# Patient Record
Sex: Male | Born: 1995 | Race: White | Hispanic: No | Marital: Single | Smoking: Never smoker
Health system: Southern US, Community
[De-identification: ages and names within clinical notes are randomized; demographics above are authoritative.]

## PROBLEM LIST (undated history)

## (undated) DIAGNOSIS — N442 Benign cyst of testis: Secondary | ICD-10-CM

## (undated) DIAGNOSIS — K219 Gastro-esophageal reflux disease without esophagitis: Secondary | ICD-10-CM

## (undated) DIAGNOSIS — I880 Nonspecific mesenteric lymphadenitis: Secondary | ICD-10-CM

## (undated) DIAGNOSIS — I10 Essential (primary) hypertension: Secondary | ICD-10-CM

## (undated) DIAGNOSIS — R519 Headache, unspecified: Secondary | ICD-10-CM

## (undated) DIAGNOSIS — R51 Headache: Secondary | ICD-10-CM

## (undated) HISTORY — PX: APPENDECTOMY: SHX54

## (undated) HISTORY — DX: Benign cyst of testis: N44.2

## (undated) HISTORY — PX: WISDOM TOOTH EXTRACTION: SHX21

---
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
## 2016-04-05 ENCOUNTER — Encounter: Payer: Self-pay | Admitting: Emergency Medicine

## 2016-04-05 ENCOUNTER — Emergency Department
Admission: EM | Admit: 2016-04-05 | Discharge: 2016-04-05 | Disposition: A | Discharge status: Home / Self Care | Payer: 59 | Source: Home / Self Care | Attending: Family Medicine | Admitting: Family Medicine

## 2016-04-05 DIAGNOSIS — J069 Acute upper respiratory infection, unspecified: Secondary | ICD-10-CM

## 2016-04-05 MED ORDER — AZITHROMYCIN 250 MG PO TABS: 250.0000 mg | ORAL_TABLET | Freq: Every day | ORAL | Status: DC

## 2016-04-05 MED ORDER — BENZONATATE 100 MG PO CAPS
100.0000 mg | ORAL_CAPSULE | Freq: Three times a day (TID) | ORAL | Status: DC
Start: 2016-04-05 — End: 2016-04-20

## 2016-04-05 NOTE — ED Notes (Signed)
Patient presents to [facility] with C/O cough cold and congestion times 1 1/2 weeks productive cough with greenish sputum. Denies pain or fever.

## 2016-04-05 NOTE — Discharge Instructions (Signed)
You may take 400-600mg  Ibuprofen (Motrin) every 6-8 hours for fever and pain  Alternate with Tylenol  You may take 500mg  Tylenol every 4-6 hours as needed for fever and pain  Follow-up with your primary care provider next week for recheck of symptoms if not improving.  Be sure to drink plenty of fluids and rest, at least 8hrs of sleep a night, preferably more while you are sick. Return urgent care or go to closest ER if you cannot keep down fluids/signs of dehydration, fever not reducing with Tylenol, difficulty breathing/wheezing, stiff neck, worsening condition, or other concerns (see below)  Please take antibiotics as prescribed and be sure to complete entire course even if you start to feel better to ensure infection does not come back.   Cool Mist Vaporizers Vaporizers may help relieve the symptoms of a cough and cold. They add moisture to the air, which helps mucus to become thinner and less sticky. This makes it easier to breathe and cough up secretions. Cool mist vaporizers do not cause serious burns like hot mist vaporizers, which may also be called steamers or humidifiers. Vaporizers have not been proven to help with colds. You should not use a vaporizer if you are allergic to mold. HOME CARE INSTRUCTIONS  Follow the package instructions for the vaporizer.  Do not use anything other than distilled water in the vaporizer.  Do not run the vaporizer all of the time. This can cause mold or bacteria to grow in the vaporizer.  Clean the vaporizer after each time it is used.  Clean and dry the vaporizer well before storing it.  Stop using the vaporizer if worsening respiratory symptoms develop.   This information is not intended to replace advice given to you by your health care provider. Make sure you discuss any questions you have with your health care provider.   Document Released: 06/18/2004 Document Revised: 09/26/2013 Document Reviewed: 02/08/2013 Elsevier Interactive Patient  Education 2016 [employer]  Cough, Adult A cough helps to clear your throat and lungs. A cough may last only 2-3 weeks (acute), or it may last longer than 8 weeks (chronic). Many different things can cause a cough. A cough may be a sign of an illness or another medical condition. HOME CARE  Pay attention to any changes in your cough.  Take medicines only as told by your doctor.  If you were prescribed an antibiotic medicine, take it as told by your doctor. Do not stop taking it even if you start to feel better.  Talk with your doctor before you try using a cough medicine.  Drink enough fluid to keep your pee (urine) clear or pale yellow.  If the air is dry, use a cold steam vaporizer or humidifier in your home.  Stay away from things that make you cough at work or at home.  If your cough is worse at night, try using extra pillows to raise your head up higher while you sleep.  Do not smoke, and try not to be around smoke. If you need help quitting, ask your doctor.  Do not have caffeine.  Do not drink alcohol.  Rest as needed. GET HELP IF:  You have new problems (symptoms).  You cough up yellow fluid (pus).  Your cough does not get better after 2-3 weeks, or your cough gets worse.  Medicine does not help your cough and you are not sleeping well.  You have pain that gets worse or pain that is not helped with medicine.  You have a fever.  You are losing weight and you do not know why.  You have night sweats. GET HELP RIGHT AWAY IF:  You cough up blood.  You have trouble breathing.  Your heartbeat is very fast.   This information is not intended to replace advice given to you by your health care provider. Make sure you discuss any questions you have with your health care provider.   Document Released: 06/04/2011 Document Revised: 06/12/2015 Document Reviewed: 11/28/2014 Elsevier Interactive Patient Education [employer].

## 2016-04-05 NOTE — ED Provider Notes (Signed)
CSN: [id number]     Arrival date & time 04/05/16  1114 History   First MD Initiated Contact with Patient 04/05/16 1145     Chief Complaint  Patient presents with  . Cough  . Nasal Congestion   (Consider location/radiation/quality/duration/timing/severity/associated sxs/prior Treatment) HPI  Erik White is a 20 y.o. male presenting to UC with c/o 1-2 weeks of gradually worsening moderately productive cough with associated bilateral ear fullness and production of green-ish fluid. Mild sore throat. He notes he drove to [facility] and spent a few days there last week but was sick prior to going. Pt believes trip made his symptoms worse. He has been trying Dayquil and Mucinex recently with minimal relief and had tried nasal saline, Flonase and Afrin initially w/o relief. No sick contacts. Denies hx of asthma. Denies difficulty breathing or swallowing. No fever, n/v/d.    Past Medical History  Diagnosis Date  . GERD (gastroesophageal reflux disease)   . Mesenteric adenitis 2005   Past Surgical History  Procedure Laterality Date  . Wisdom tooth extraction     Family History  Problem Relation Age of Onset  . Heart attack Father   . Cancer Other     lung  . Cancer Other     liver CA  . Thyroid disease Mother   . Hypertension Mother    Social History  Substance Use Topics  . Smoking status: Never Smoker   . Smokeless tobacco: None  . Alcohol Use: No    Review of Systems  Constitutional: Negative for fever and chills.  HENT: Positive for congestion, rhinorrhea and sore throat. Negative for ear pain, trouble swallowing and voice change.   Respiratory: Positive for cough. Negative for shortness of breath.   Cardiovascular: Negative for chest pain and palpitations.  Gastrointestinal: Negative for nausea, vomiting, abdominal pain and diarrhea.  Musculoskeletal: Negative for myalgias, back pain and arthralgias.  Skin: Negative for rash.  Neurological: Negative for dizziness,  light-headedness and headaches.    Allergies  Review of patient's allergies indicates no known allergies.  Home Medications   Prior to Admission medications   Medication Sig Start Date End Date Taking? Authorizing Provider  azithromycin (ZITHROMAX) 250 MG tablet Take 1 tablet (250 mg total) by mouth daily. Take first 2 tablets together, then 1 every day until finished. 04/05/16   Noland Fordyce, PA-C  benzonatate (TESSALON) 100 MG capsule Take 1 capsule (100 mg total) by mouth every 8 (eight) hours. 09/17/15   Noland Fordyce, PA-C  benzonatate (TESSALON) 100 MG capsule Take 1-2 capsules (100-200 mg total) by mouth every 8 (eight) hours. 04/05/16   Noland Fordyce, PA-C  dextromethorphan-guaiFENesin (MUCINEX DM) 30-600 MG 12hr tablet Take 1 tablet by mouth 2 (two) times daily. 09/17/15   Noland Fordyce, PA-C   Meds Ordered and Administered this Visit  Medications - No data to display  BP 111/73 mmHg  Pulse 68  Temp(Src) 98.1 F (36.7 C) (Oral)  Resp 16  Ht 6\' 1"  (1.854 m)  Wt 221 lb 8 oz (100.472 kg)  BMI 29.23 kg/m2  SpO2 99% No data found.   Physical Exam  Constitutional: He appears well-developed and well-nourished.  HENT:  Head: Normocephalic and atraumatic.  Right Ear: Tympanic membrane normal.  Left Ear: Tympanic membrane normal.  Nose: Mucosal edema present.  Mouth/Throat: Uvula is midline, oropharynx is clear and moist and mucous membranes are normal.  Eyes: Conjunctivae are normal. No scleral icterus.  Neck: Normal range of motion. Neck supple.  Cardiovascular: Normal  rate, regular rhythm and normal heart sounds.   Pulmonary/Chest: Effort normal and breath sounds normal. No respiratory distress. He has no wheezes. He has no rales.  Faint diffuse rhonchi, clears with cough, no respiratory distress.  Abdominal: Soft. He exhibits no distension. There is no tenderness. There is no rebound.  Musculoskeletal: Normal range of motion.  Neurological: He is alert.  Skin: Skin is  warm and dry.  Nursing note and vitals reviewed.   ED Course  Procedures (including critical care time)  Labs Review Labs Reviewed - No data to display  Imaging Review No results found.    MDM   1. Acute upper respiratory infection    Pt c/o 1-2 weeks of worsening URI symptoms. Pt afebrile. No respiratory distress. Rhonchi clears with cough.  Will treat for bacterial cause given duration and worsening symptoms. Rx: azithromycin and tessalon  Advised pt to use acetaminophen and ibuprofen as needed for fever and pain. Encouraged rest and fluids. F/u with PCP in 7-10 days if not improving, sooner if worsening. Pt verbalized understanding and agreement with tx plan.     Noland Fordyce, PA-C 04/05/16 1452

## 2016-04-20 ENCOUNTER — Encounter: Payer: Self-pay | Admitting: Family Medicine

## 2016-04-20 ENCOUNTER — Ambulatory Visit (INDEPENDENT_AMBULATORY_CARE_PROVIDER_SITE_OTHER): Payer: 59

## 2016-04-20 ENCOUNTER — Ambulatory Visit (INDEPENDENT_AMBULATORY_CARE_PROVIDER_SITE_OTHER): Payer: 59 | Admitting: Family Medicine

## 2016-04-20 VITALS — BP 122/83 | HR 70 | Ht 73.0 in | Wt 218.0 lb

## 2016-04-20 DIAGNOSIS — M7652 Patellar tendinitis, left knee: Secondary | ICD-10-CM | POA: Diagnosis not present

## 2016-04-20 DIAGNOSIS — N63 Unspecified lump in unspecified breast: Secondary | ICD-10-CM | POA: Insufficient documentation

## 2016-04-20 DIAGNOSIS — M25312 Other instability, left shoulder: Secondary | ICD-10-CM

## 2016-04-20 DIAGNOSIS — R946 Abnormal results of thyroid function studies: Secondary | ICD-10-CM

## 2016-04-20 DIAGNOSIS — E291 Testicular hypofunction: Secondary | ICD-10-CM | POA: Diagnosis not present

## 2016-04-20 DIAGNOSIS — R7989 Other specified abnormal findings of blood chemistry: Secondary | ICD-10-CM

## 2016-04-20 MED ORDER — DICLOFENAC SODIUM 1 % TD GEL: 4.0000 g | Freq: Four times a day (QID) | TRANSDERMAL | Status: DC

## 2016-04-20 NOTE — Patient Instructions (Signed)
Thank you for coming in today. Get morning labs soon.  Get xray today and MRI shoulder soon.  Use gel on the knee.  Return a few days after MRI.   Anterior Shoulder Instability With Rehab Anterior shoulder instability is a condition that is characterized by recurrent dislocation of the shoulder joint. Dislocation is an injury in which two adjacent bones are no longer in proper alignment, and the joint surfaces are no longer touching. Subluxation is a similar injury to dislocation; however, the joint surfaces are still touching. Dislocations and subluxations of the shoulder joint (glenohumeral) most commonly involve the upper arm bone (humerus) displacing forward (anteriorly). The shoulder joint allows more motion than any other joint in the body, and because of this it is highly susceptible to injury. When the glenohumeral joint is dislocated or subluxated, the muscles that control the shoulder joint (rotator cuff) tendons become stretched. Repetitive injury results in the shoulder joint becoming loose and results in instability of the shoulder joint. These injuries may also cause a tear in the cartilage that lines the joint and helps keep the humerus head in place (labrum). SYMPTOMS   Severe shoulder pain when the joint is dislocated or subluxated.  Shoulder weakness, pain, and/or inflammation.  Loss of shoulder function.  Pain that worsens with shoulder function, especially motions that involve arm movements above shoulder height.  Feeling of shoulder weakness or instability.  Signs of nerve damage: numbness or paralysis.  Crackling (crepitation) feeling and sound when the injured area is touched or with shoulder motion. CAUSES  Anterior shoulder instability is caused by injury to the glenohumeral joint that causes it to become dislocated or subluxated. Common mechanisms of injury include:  Direct trauma to the shoulder joint.  Repetitive and/or strenuous movements of the shoulder  joint, especially those with the arm above shoulder height.  Sprain of one of the ligaments of the shoulder joint.  An abnormality you are born with (congenital). This includes a shallow or malformed joint surface. RISK INCREASES WITH:  Contact sports (football, wrestling, and basketball).  Activities that involve repetitive and/or strenuous movements of the shoulder joint, especially those with the arm above shoulder height (baseball, volleyball, or swimming).  Previous shoulder injury.  Poor strength and flexibility. PREVENTION  Warm up and stretch properly before activity.  Allow for adequate recovery between workouts.  Maintain physical fitness:  Strength, flexibility, and endurance.  Cardiovascular fitness.  Learn and use proper technique. When possible, have a coach correct improper technique.  Wear properly fitted and padded protective equipment. PROGNOSIS  The extent of recovery and likelihood of future dislocations and subluxations depends on the extent of damage done to the shoulder.  RELATED COMPLICATIONS   Damage to the nervous system or blood vessels that may cause weakness, paralysis, numbness, coldness, and paleness.  Damage to the bones or cartilage of the shoulder joint.  Permanent shoulder instability.  Tear of one or more of the rotator cuff tendons.  Arthritis of the shoulder. TREATMENT When the shoulder joint is dislocated it must be realigned (reduced) by someone who is trained in the procedure. Occasionally reduction cannot be performed manually, and requires surgery. After reduction, the use of ice and medication may help reduce pain and inflammation. The shoulder should be immobilized with a sling for 3 to 8 weeks to allow the joint to heal. After immobilization, it is important to perform strengthening and stretching exercises to help regain strength and a full range of motion. These exercises may be completed at  home or with a therapist. Surgery  is reserved for individuals who have sustained multiple shoulder dislocations due to shoulder instability. MEDICATION   General anesthesia or muscle relaxants may be necessary for reduction of the shoulder joint.  If pain medication is necessary, then nonsteroidal anti-inflammatory medications, such as aspirin and ibuprofen, or other minor pain relievers, such as acetaminophen, are often recommended.  Do not take pain medication within 7 days before surgery.  Prescription pain relievers may be given if deemed necessary by your caregiver. Use only as directed and only as much as you need. COLD THERAPY  Cold treatment (icing) relieves pain and reduces inflammation. Cold treatment should be applied for 10 to 15 minutes every 2 to 3 hours for inflammation and pain and immediately after any activity that aggravates your symptoms. Use ice packs or an ice massage. SEEK MEDICAL CARE IF:  Treatment seems to offer no benefit, or the condition worsens.  Any medications produce adverse side effects.  Any complications from surgery occur:  Pain, numbness, or coldness in the extremity operated upon.  Discoloration of the nail beds (they become blue or gray) of the extremity operated upon.  Signs of infections (fever, pain, inflammation, redness, or persistent bleeding). EXERCISES RANGE OF MOTION (ROM) AND STRETCHING EXERCISES - Shoulder Instability, Anterior These exercises may help you restore your shoulder mobility once your physician has discontinued your 2-6 weeks of immobilization. Beginning these before your provider's approval may result in delayed healing. Your symptoms may resolve with or without further involvement from your physician, physical therapist or athletic trainer. While completing these exercises, remember:   Restoring tissue flexibility helps normal motion to return to the joints. This allows healthier, less painful movement and activity.  An effective stretch should be held  for at least 30 seconds. A stretch should never be painful. You should only feel a gentle lengthening or release in the stretch.  During your recovery, avoid activity or exercises which involve actions that place your right / left hand or elbow above your head or behind your back or head. These positions stress the tissues which are trying to heal. ROM - Pendulum  Bend at the waist so that your right / left arm falls away from your body. Support yourself with your opposite hand on a solid surface, such as a table or a countertop.  Your right / left arm should be perpendicular to the ground. If it is not perpendicular, you need to lean over farther. Relax the muscles in your right / left arm and shoulder as much as possible.  Gently sway your hips and trunk so they move your arm without any use of your right / left shoulder muscles.  Progress your movements so that your arm moves side to side, then forward and backward, and finally, both clockwise and counterclockwise.  Complete __________ repetitions in each direction. Many people use this exercise to relieve discomfort in their shoulder as well as to gain range of motion. Repeat __________ times. Complete this exercise __________ times per day. STRETCH - Flexion, Seated  Sit in a firm chair so that your right / left forearm can rest on a table or on a table or countertop. Your elbow should rest below the height of your shoulder so that your shoulder feels supported and not tense or uncomfortable.  Keeping your right / left shoulder relaxed, lean forward at your waist, allowing your hand to slide forward. Bend forward until you feel a moderate stretch in your shoulder, but  before you feel an increase in your pain.  Hold __________ seconds. Slowly return to your starting position. Repeat __________ times. Complete this exercise __________ times per day.  STRETCH - Flexion, Standing  Stand with good posture. With an underhand grip on your  right / left hand and an overhand grip on the opposite hand, grasp a broomstick or cane so that your hands are a little more than shoulder-width apart.  Keeping your right / left elbow straight and shoulder muscles relaxed, push the stick with your opposite hand to raise your arm in front of your body and then overhead. Raise your arm until you feel a stretch in your right / left shoulder, but before you have increased shoulder pain.  Try to avoid shrugging your shoulder as your arm rises by keeping your shoulder blade tucked down and toward your mid-back spine. Hold __________ seconds.  Slowly return to the starting position. Repeat __________ times. Complete this exercise __________ times per day.  STRETCH - Abduction, Supine  Stand with good posture. With an underhand grip on your right / left hand and an overhand grip on the opposite hand, grasp a broomstick or cane so that your hands are a little more than shoulder-width apart.  Keeping your right / left elbow straight and shoulder muscles relaxed, push the stick with your opposite hand to raise your right / left arm out to the side of your body and then overhead. Raise your arm until you feel a stretch in your shoulder, but before you have increased shoulder pain.  Try to avoid shrugging your shoulder as your arm rises by keeping your shoulder blade tucked down and toward your mid-back spine. Hold __________ seconds.  Slowly return to the starting position. Repeat __________ times. Complete this exercise __________ times per day.  ROM - Flexion, Active-Assisted  Lie on your back. You may bend your knees for comfort.  Grasp a broomstick or cane so your hands are about shoulder-width apart. Your right / left hand should grip the end of the stick/cane so that your hand is positioned "thumbs-up," as if you were about to shake hands.  Using your healthy arm to lead, raise your right / left arm overhead until you feel a gentle stretch in your  shoulder. Hold __________ seconds.  Use the stick/cane to assist in returning your arm to its starting position. Repeat __________ times. Complete this exercise __________ times per day.  STRETCH - Flexion, Standing  Stand facing a wall. Walk your right / left fingers up the wall until you feel a moderate stretch in your shoulder. As your hand gets higher, you may need to step closer to the wall or use a door frame to walk through.  Try to avoid shrugging your shoulder as your arm rises by keeping your shoulder blade tucked down and toward your mid-back spine.  Hold __________ seconds. Use your other hand, if needed, to ease out of the stretch and return to the starting position. Repeat __________ times. Complete this exercise __________ times per day.  STRETCH - External Rotation  Tuck a folded towel or small ball under your right / left upper arm. Grasp a broomstick or cane with an underhand grasp a little more than shoulder width apart. Bend your elbows to 90 degrees.  Stand with good posture or sit in a chair without arms.  Use your strong arm to push the stick across your body. Do not allow the towel or ball to fall. This will rotate  your right / left arm away from your abdomen. Using the stick turn/rotate your hand and forearm away from your body. Hold __________ seconds. Repeat __________ times. Complete this exercise __________ times per day.  STRENGTHENING EXERCISES - Shoulder Instability, Anterior These exercises may help you restore your shoulder strength once your physician has discontinued your 2-6 weeks of immobilization. When completing these exercises, do not raise your arm above shoulder-height until your physician, physical therapist or athletic trainer has instructed you to do so. Advancing your exercise before your provider's approval may result in delayed healing. While completing these exercises, remember:   Muscles can gain both the endurance and the strength needed for  everyday activities through controlled exercises.  Complete these exercises as instructed by your physician, physical therapist or athletic trainer. Progress the resistance and repetitions only as guided.  You may experience muscle soreness or fatigue, but the pain or discomfort you are trying to eliminate should never worsen during these exercises. If this pain does worsen, stop and make certain you are following the directions exactly. If the pain is still present after adjustments, discontinue the exercise until you can discuss the trouble with your clinician.  During your recovery, avoid activity or exercises which involve actions that place your right / left hand or elbow above your head or behind your back or head. These positions stress the tissues which are trying to heal. STRENGTH - Scapular Depression and Adduction   With good posture, sit on a firm chair. Supported your arms in front of you with pillows, arm rests or a table top. Have your elbows in line with the sides of your body.  Gently draw your shoulder blades down and toward your mid-back spine. Gradually increase the tension without tensing the muscles along the top of your shoulders and the back of your neck.  Hold for __________ seconds. Slowly release the tension and relax your muscles completely before completing the next repetition.  After you have practiced this exercise, remove the arm support and complete it in standing as well as sitting. Repeat __________ times. Complete this exercise __________ times per day.  STRENGTH - Shoulder Flexion, Isometric  With good posture and facing a wall, stand or sit about 4-6 inches away.  Keeping your right / left elbow straight, gently press the top of your fist into the wall. Increase the pressure gradually until you are pressing as hard as you can without shrugging your shoulder or increasing any shoulder discomfort.  Hold __________ seconds.  Release the tension slowly.  Relax your shoulder muscles completely before you the next repetition. Repeat __________ times. Complete this exercise __________ times per day.  STRENGTH - Shoulder Abductors, Isometric  With good posture, stand or sit about 4-6 inches from a wall with your right / left side facing the wall.  Bend your right / left elbow. Gently press your elbow into the wall. Increase the pressure gradually until you are pressing as hard as you can without shrugging your shoulder or increasing any shoulder discomfort.  Hold __________ seconds.  Release the tension slowly. Relax your shoulder muscles completely before you the next repetition. Repeat __________ times. Complete this exercise __________ times per day.  STRENGTH - Internal Rotators, Isometric  Keep your right / left elbow at your side and bend it 90 degrees.  Step into a door frame so that the inside of your right / left wrist can press against the door frame without your upper arm leaving your side.  Gently  press your right / left wrist into the door frame as if you were trying to draw the palm of your hand to your abdomen. Gradually increase the tension until you are pressing as hard as you can without shrugging your shoulder or increasing any shoulder discomfort.  Hold __________ seconds.  Release the tension slowly. Relax your shoulder muscles completely before you the next repetition. Repeat __________ times. Complete this exercise __________ times per day.  STRENGTH - External Rotators, Isometric  Keep your right / left elbow at your side and bend it 90 degrees.  Step into a door frame so that the outside of your right / left wrist can press against the door frame without your upper arm leaving your side.  Gently press your right / left wrist into the door frame as if you were trying to swing the back of your hand away from your abdomen. Gradually increase the tension until you are pressing as hard as you can without shrugging your  shoulder or increasing any shoulder discomfort.  Hold __________ seconds.  Release the tension slowly. Relax your shoulder muscles completely before you the next repetition. Repeat __________ times. Complete this exercise __________ times per day.  STRENGTH - Scapular Protractors, Standing  Stand arms-length away from a wall. Place your hands on the wall, keeping your elbows straight.  Begin by dropping your shoulder blades down and toward your mid-back spine.  To strengthen your protractors, keep your shoulder blades down, but slide them forward on your rib cage. It will feel as if you are lifting the back of your rib cage away from the wall. This is a subtle motion and can be challenging to complete. Ask your clinician for further instruction if you are not sure you are doing the exercise correctly.  Hold for __________ seconds. Slowly return to the starting position, resting the muscles completely before completing the next repetition. Repeat __________ times. Complete this exercise __________ times per day. STRENGTH - Scapular Protractors, Supine  Lie on your back on a firm surface. Extend your right / left arm straight into the air while holding a __________ weight in your hand.  Keeping your head and back in place, lift your shoulder off the floor.  Hold __________ seconds. Slowly return to the starting position and allow your muscles to relax completely before completing the next repetition. Repeat __________ times. Complete this exercise __________ times per day. STRENGTH - Scapular Protractors, Quadruped  Get onto your hands and knees with your shoulders directly over your hands (or as close as you comfortably can be).  Keeping your elbows locked, lift the back of your rib cage up into your shoulder blades so your mid-back rounds-out. Keep your neck muscles relaxed.  Hold this position for __________ seconds. Slowly return to the starting position and allow your muscles to  relax completely before completing the next repetition. Repeat __________ times. Complete this exercise __________ times per day.  STRENGTH - Shoulder Extensors, Prone  Lie on your stomach on a firm surface so that your right / left arm overhangs the edge. Rest your forehead on your opposite forearm. With your thumb facing away from your body and your elbow straight, hold a __________ weight in your hand.  Squeeze your right / left shoulder blade to your mid-back spine and then slowly raise your arm behind you to the height of the bed.  Hold for __________ seconds. Slowly reverse the directions and return to the starting position, controlling the weight as you  lower your arm. Repeat __________ times. Complete this exercise __________ times per day.  STRENGTH - Shoulder Flexion  Stand or sit with good posture. Grasp a __________ weight or an exercise band/tubing so that your hand is "thumbs-up," like when you shake hands.  Slowly lift your right / left arm as far as you can without increasing any shoulder pain. Initially, many people can only raise their hand to shoulder height.  Avoid shrugging your shoulder as your arm rises by keeping your shoulder blade tucked down and toward your mid-back spine.  Hold for __________ seconds. Control the descent of your hand as you slowly return to your starting position. Repeat __________ times. Complete this exercise __________ times per day.  STRENGTH - Scapular Retractors  Secure a rubber exercise band/tubing so that it is at the height of your shoulders when you are either standing or sitting on a firm arm-less chair.  With a palm-down grip, grasp an end of the band/tubing in each hand. Straighten your elbows and lift your hands straight in front of you at shoulder height. Step back away from the secured end of band/tubing until it becomes tense.  Squeezing your shoulder blades together, draw your elbows back as you bend them. Keep your upper arm  lifted away from your body throughout the exercise.  Hold __________ seconds. Slowly ease the tension on the band/tubing as you reverse the directions and return to the starting position. Repeat __________ times. Complete this exercise __________ times per day. STRENGTH - Internal Rotators  Secure a rubber exercise band/tubing to a fixed object so that it is at the same height as your right / left elbow when you are standing or sitting on a firm surface.  Stand or sit so that the secured exercise band/tubing is at your right / left side.  Bend your elbow 90 degrees. Place a folded towel or small pillow under your right / left arm so that your elbow is a few inches away from your side.  Keeping the tension on the exercise band/tubing, pull it across your body toward your abdomen. Be sure to keep your body steady so that the movement is only coming from your shoulder rotating.  Hold __________ seconds. Release the tension in a controlled manner as you return to the starting position. Repeat __________ times. Complete this exercise __________ times per day.  STRENGTH - External Rotators  Secure a rubber exercise band/tubing to a fixed object so that it is at the same height as your right / left elbow when you are standing or sitting on a firm surface.  Stand or sit so that the secured exercise band/tubing is at your side that is not injured.  Bend your elbow 90 degrees. Place a folded towel or small pillow under your right / left arm so that your elbow is a few inches away from your side.  Keeping the tension on the exercise band/tubing, pull it away from your body, as if pivoting on your elbow. Be sure to keep your body steady so that the movement is only coming from your shoulder rotating.  Hold __________ seconds. Release the tension in a controlled manner as you return to the starting position. Repeat __________ times. Complete this exercise __________ times per day.    This information  is not intended to replace advice given to you by your health care provider. Make sure you discuss any questions you have with your health care provider.   Document Released: 09/21/2005 Document Revised: 06/12/2015  Document Reviewed: 01/03/2009 [employer] Interactive Patient Education 2016 [employer]   Patellar Tendinitis With Rehab Tendinitis is inflammation of a tendon. Tendonitis of the tendon below the kneecap (patella) is known as patellar tendonitis. Patellar tendonitis is also called jumper's knee. Jumper's knee is a common cause of pain below the kneecap (infrapatellar). Jumper's knee may involve a tear (strain) in the ligament. Strains are classified into three categories. Grade 1 strains cause pain, but the tendon is not lengthened. Grade 2 strains include a lengthened ligament, due to the ligament being stretched or partially ruptured. With grade 2 strains there is still function, although function may be decreased. Grade 3 strains involve a complete tear of the tendon or muscle, and function is usually impaired. Patellar tendon strains are usually grade 1 or 2.  SYMPTOMS  Pain, tenderness, swelling, warmth, or redness over the patellar tendon (just below the kneecap). Pain and loss of strength (sometimes), with forcefully straightening the knee (especially when jumping or rising from a seated or squatting position), or bending the knee completely (squatting or kneeling). Crackling sound (crepitation) when the tendon is moved or touched. CAUSES  Patellar tendonitis is caused by injury to the patellar tendon. The inflammation is the body's healing response. Common causes of injury include: Stress from a sudden increase in intensity, frequency, or duration of training. Overuse of the thigh muscles (quadriceps) and patellar tendon. Direct hit (trauma) to the knee or patellar tendon. RISK INCREASES WITH: Sports that require sudden, explosive quadriceps contraction, such as jumping, quick  starts, or kicking. Running sports, especially running down hills. Poor strength and flexibility of the thigh and knee. Flat feet. PREVENTION Warm up and stretch properly before activity. Allow for adequate recovery between workouts. Maintain physical fitness: Strength, flexibility, and endurance. Cardiovascular fitness. Protect the knee joint with taping, protective strapping, bracing, or elastic compression bandage. Wear arch supports (orthotics). PROGNOSIS  If treated properly, patellar tendonitis usually heals within 6 weeks.  RELATED COMPLICATIONS  Longer healing time if not properly treated or if not given enough time to heal. Recurring symptoms if activity is resumed too soon, with overuse, with a direct blow, or when using poor technique. If untreated, tendon rupture requiring surgery. TREATMENT Treatment first involves the use of ice and medicine to reduce pain and inflammation. The use of strengthening and stretching exercises may help reduce pain with activity. These exercises may be performed at home or with a therapist. Serious cases of tendonitis may require restraining the knee for 10 to 14 days to prevent stress on the tendon and to promote healing. Crutches may be used (uncommon) until you can walk without a limp. For cases in which nonsurgical treatment is unsuccessful, surgery may be advised to remove the inflamed tendon lining (sheath). Surgery is rare, and is only advised after at least 6 months of nonsurgical treatment. MEDICATION  If pain medicine is needed, nonsteroidal anti-inflammatory medicines (aspirin and ibuprofen), or other minor pain relievers (acetaminophen), are often advised. Do not take pain medicine for 7 days before surgery. Prescription pain relievers may be given if your caregiver thinks they are needed. Use only as directed and only as much as you need. HEAT AND COLD Cold treatment (icing) should be applied for 10 to 15 minutes every 2 to 3 hours for  inflammation and pain, and immediately after activity that aggravates your symptoms. Use ice packs or an ice massage. Heat treatment may be used before performing stretching and strengthening activities prescribed by your caregiver, physical therapist, or  Product/process development scientist. Use a heat pack or a warm water soak. SEEK MEDICAL CARE IF: Symptoms get worse or do not improve in 2 weeks, despite treatment. New, unexplained symptoms develop. (Drugs used in treatment may produce side effects.) EXERCISES RANGE OF MOTION (ROM) AND STRETCHING EXERCISES - Patellar Tendinitis (Jumper's Knee) These are some of the initial exercises with which you may start your rehabilitation program, until you see your caregiver again or until your symptoms are resolved. Remember:  Flexible tissue is more tolerant of the stresses placed on it during activities. Each stretch should be held for 20 to 30 seconds. A gentle stretching sensation should be felt. STRETCH - Hamstrings, Supine Lie on your back. Loop a belt or towel over the ball of your right / left foot. Straighten your right / left knee and slowly pull on the belt to raise your leg. Do not allow the right / left knee to bend. Keep your opposite leg flat on the floor. Raise the leg until you feel a gentle stretch behind your right / left knee or thigh. Hold this position for __________ seconds. Repeat __________ times. Complete this stretch __________ times per day.  STRETCH - Hamstrings, Doorway Lie on your back with your right / left leg extended and resting on the wall, and the opposite leg flat on the ground through the door. At first, position your bottom farther away from the wall. Keep your right / left knee straight. If you feel a stretch behind your knee or thigh, hold this position for __________ seconds. If you do not feel a stretch, scoot your bottom closer to the door, and hold __________ seconds. Repeat __________ times. Complete this stretch __________  times per day.  STRETCH - Hamstrings, Standing Stand or sit and extend your right / left leg, placing your foot on a chair or foot stool. Keep a slight arch in your low back and your hips straight forward. Lead with your chest and lean forward at the waist until you feel a gentle stretch in the back of your right / left knee or thigh. (When done correctly, this exercise requires leaning only a small distance.) Hold this position for __________ seconds. Repeat __________ times. Complete this stretch __________ times per day. STRETCH - Adductors, Lunge While standing, spread your legs, with your right / left leg behind you. Lean away from your right / left leg by bending your opposite knee. You may rest your hands on your thigh for balance. You should feel a stretch in your right / left inner thigh. Hold for __________ seconds. Repeat __________ times. Complete this exercise __________ times per day.  STRENGTHENING EXERCISES - Patellar Tendinitis (Jumper's Knee) These exercises may help you when beginning to rehabilitate your injury. They may resolve your symptoms with or without further involvement from your physician, physical therapist or athletic trainer. While completing these exercises, remember:  Muscles can gain both the endurance and the strength needed for everyday activities through controlled exercises. Complete these exercises as instructed by your physician, physical therapist or athletic trainer. Increase the resistance and repetitions only as guided by your caregiver. STRENGTH - Quadriceps, Isometrics Lie on your back with your right / left leg extended and your opposite knee bent. Gradually tense the muscles in the front of your right / left thigh. You should see either your kneecap slide up toward your hip or increased dimpling just above the knee. This motion will push the back of the knee down toward the floor, mat, or  bed on which you are lying. Hold the muscle as tight as you  can, without increasing your pain, for __________ seconds. Relax the muscles slowly and completely in between each repetition. Repeat __________ times. Complete this exercise __________ times per day.  STRENGTH - Quadriceps, Short Arcs Lie on your back. Place a __________ inch towel roll under your right / left knee, so that the knee bends slightly. Raise only your lower leg by tightening the muscles in the front of your thigh. Do not allow your thigh to rise. Hold this position for __________ seconds. Repeat __________ times. Complete this exercise __________ times per day.  OPTIONAL ANKLE WEIGHTS: Begin with ____________________, but DO NOT exceed ____________________. Increase in 1 pound/ 0.5 kilogram increments. STRENGTH - Quadriceps, Straight Leg Raises  Quality counts! Watch for signs that the quadriceps muscle is working, to be sure you are strengthening the correct muscles and not "cheating" by substituting with healthier muscles. Lay on your back with your right / left leg extended and your opposite knee bent. Tense the muscles in the front of your right / left thigh. You should see either your kneecap slide up or increased dimpling just above the knee. Your thigh may even shake a bit. Tighten these muscles even more and raise your leg 4 to 6 inches off the floor. Hold for __________ seconds. Keeping these muscles tense, lower your leg. Relax the muscles slowly and completely between each repetition. Repeat __________ times. Complete this exercise __________ times per day.  STRENGTH - Quadriceps, Squats Stand in a door frame so that your feet and knees are in line with the frame. Use your hands for balance, not support, on the frame. Slowly lower your weight, bending at the hips and knees. Keep your lower legs upright so that they are parallel with the door frame. Squat only within the range that does not increase your knee pain. Never let your hips drop below your knees. Slowly return  upright, pushing with your legs, not pulling with your hands. Repeat __________ times. Complete this exercise __________ times per day.  STRENGTH - Quadriceps, Step-Downs Stand on the edge of a step stool or stair. Be prepared to use a countertop or wall for balance, if needed. Keeping your right / left knee directly over the middle of your foot, slowly touch your opposite heel to the floor or lower step. Do not go all the way to the floor if your knee pain increases; just go as far as you can without increased discomfort. Use your right / left leg muscles, not gravity to lower your body weight. Slowly push your body weight back up to the starting position. Repeat __________ times. Complete this exercise __________ times per day.    This information is not intended to replace advice given to you by your health care provider. Make sure you discuss any questions you have with your health care provider.   Document Released: 09/21/2005 Document Revised: 02/05/2015 Document Reviewed: 01/03/2009 Elsevier Interactive Patient Education [employer].

## 2016-04-20 NOTE — Progress Notes (Signed)
Erik White is a 20 y.o. male who presents to [facility]: [place] today for establish care and discuss several issues.  1) left nipple mass: Patient is a 1 year history of swelling underneath his left nipple. This was initially mildly tender but now is pain-free. It has not changed any urine does not bother him. He denies any unexplained weight loss fevers chills night sweats. He feels well. No treatment for this issue yet.  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 He notes that when he lifts his arm over his head and does mild weight lifting the shoulder comes out of joint and pops. He notes this is painful and distressing. He denies any radiating pain weakness or numbness. He's not sure if he's had any injury to explain this instability. The instability is limiting his exercise and work opportunities.  3) left knee pain: Patient jumped for a ball playing soccer yesterday and landed on his left knee. He notes pain in the anterior aspect of the knee. He denies any instability or swelling or popping. He notes pain is worse with walking and better with rest. He has not tried any medications or treatment yet. No radiating pain weakness or numbness fevers or chills.    Past Medical History  Diagnosis Date  . GERD (gastroesophageal reflux disease)   . Mesenteric adenitis 2005   Past Surgical History  Procedure Laterality Date  . Wisdom tooth extraction     Social History  Substance Use Topics  . Smoking status: Never Smoker   . Smokeless tobacco: Not on file  . Alcohol Use: No   family history includes Cancer in his other and other; Heart attack in his father; Hypertension in his mother; Thyroid disease in his mother.  ROS as above: No headache, visual changes, nausea, vomiting, diarrhea,  constipation, dizziness, abdominal pain, skin rash, fevers, chills, night sweats, weight loss, swollen lymph nodes, body aches, joint swelling, muscle aches, chest pain, shortness of breath, mood changes, visual or auditory hallucinations.   Medications: Current Outpatient Prescriptions  Medication Sig Dispense Refill  . ibuprofen (ADVIL,MOTRIN) 200 MG tablet Take 200 mg by mouth as needed.    . diclofenac sodium (VOLTAREN) 1 % GEL Apply 4 g topically 4 (four) times daily. To affected joint. 100 g 11  . [DISCONTINUED] esomeprazole (NEXIUM) 40 MG packet Take 40 mg by mouth daily before breakfast.    . [DISCONTINUED] promethazine (PHENERGAN) 25 MG tablet Take 1 tablet (25 mg total) by mouth every 6 (six) hours as needed for nausea or vomiting. 12 tablet 0   No current facility-administered medications for this visit.   No Known Allergies   Exam:  BP 122/83 mmHg  Pulse 70  Ht 6\' 1"  (1.854 m)  Wt 218 lb (98.884 kg)  BMI 28.77 kg/m2 Gen: Well NAD HEENT: EOMI,  MMM Lungs: Normal work of breathing. CTABL Breast: Symmetrical normal-appearing nipples bilaterally. Left nipple slight tiny 3 mm freely mobile nontender mass on the lateral margin of the areola. No adnexal lymphadenopathy. No skin changes. Otherwise normal.  Heart: RRR no MRG Abd: NABS, Soft. Nondistended, Nontender Exts: Brisk capillary refill, warm and well perfused.  Left shoulder: Normal-appearing nontender.  Full motion. Normal strength. Positive O'Brien's test Positive anterior hypertension test and positive relocation test. Positive click and grind test read Negative Hawkins and Neer's tests. Next a negative Yergason's and speeds  test.  Contralateral right shoulder normal-appearing nontender full motion normal strength negative impingement or labrum testing.  Left knee: Normal-appearing. Tender palpation at the tibial tuberosity insertion of the patellar tendon. Normal motion. Stable ligamentous exam. Intact  extension strength but some pain with resisted knee extension.  Beighton score 1/9  X-ray left shoulder:  Pending  No results found for this or any previous visit (from the past 24 hour(s)). No results found.    Assessment and Plan: 20 y.o. male with  1) breast mass: Very likely gynecomastia. Exam is reassuringly benign. Plan for serum testosterone CBC and CMP testing. Plan for watchful waiting will continue to follow.  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 This is limiting recreational and occupational activities. Plan for MRI arthrogram to evaluate for a labrum tear which I think is the most likely diagnosis. MRI for surgical planning.  3) left knee pain: Is very likely patellar tendinitis. Plan for diclofenac gel and home exercise program.   Discussed warning signs or symptoms. Please see discharge instructions. Patient expresses understanding.

## 2016-04-21 NOTE — Progress Notes (Signed)
Quick Note:  Xray shoulder is normal. ______

## 2016-04-22 LAB — CBC
HEMATOCRIT: 44.6 % (ref 36.0–49.0)
Hemoglobin: 15 g/dL (ref 12.0–16.9)
MCH: 29.6 pg (ref 25.0–35.0)
MCHC: 33.6 g/dL (ref 31.0–36.0)
MCV: 88.1 fL (ref 78.0–98.0)
MPV: 11.7 fL (ref 7.5–12.5)
PLATELETS: 195 10*3/uL (ref 140–400)
RBC: 5.06 MIL/uL (ref 4.10–5.70)
RDW: 13.8 % (ref 11.0–15.0)
WBC: 8 10*3/uL (ref 4.5–13.0)

## 2016-04-23 LAB — COMPREHENSIVE METABOLIC PANEL
ALK PHOS: 89 U/L (ref 48–230)
ALT: 28 U/L (ref 8–46)
AST: 61 U/L — AB (ref 12–32)
Albumin: 4.4 g/dL (ref 3.6–5.1)
BILIRUBIN TOTAL: 0.6 mg/dL (ref 0.2–1.1)
BUN: 16 mg/dL (ref 7–20)
CO2: 25 mmol/L (ref 20–31)
Calcium: 9.5 mg/dL (ref 8.9–10.4)
Chloride: 105 mmol/L (ref 98–110)
Creat: 1 mg/dL (ref 0.60–1.26)
GLUCOSE: 59 mg/dL — AB (ref 65–99)
Potassium: 4 mmol/L (ref 3.8–5.1)
Sodium: 141 mmol/L (ref 135–146)
Total Protein: 6.6 g/dL (ref 6.3–8.2)

## 2016-04-23 LAB — TESTOSTERONE: Testosterone: 238 ng/dL — ABNORMAL LOW (ref 250–827)

## 2016-04-23 NOTE — Progress Notes (Signed)
Quick Note:  Testosterone is low. This could be a lab error or a true findings. We will repeat this test along with some other tests that could help [place] figure out what is going on.   These labs must be done in the morning before 9am. ______

## 2016-04-23 NOTE — Addendum Note (Signed)
Addended by: Gregor Hams on: 04/23/2016 07:49 AM   Modules accepted: Orders, SmartSet

## 2016-04-27 ENCOUNTER — Ambulatory Visit (INDEPENDENT_AMBULATORY_CARE_PROVIDER_SITE_OTHER): Payer: 59 | Admitting: Family Medicine

## 2016-04-27 ENCOUNTER — Ambulatory Visit (INDEPENDENT_AMBULATORY_CARE_PROVIDER_SITE_OTHER): Payer: 59

## 2016-04-27 ENCOUNTER — Encounter: Payer: Self-pay | Admitting: Family Medicine

## 2016-04-27 VITALS — BP 134/80 | HR 73 | Wt 214.0 lb

## 2016-04-27 DIAGNOSIS — X58XXXA Exposure to other specified factors, initial encounter: Secondary | ICD-10-CM | POA: Diagnosis not present

## 2016-04-27 DIAGNOSIS — E291 Testicular hypofunction: Secondary | ICD-10-CM | POA: Diagnosis not present

## 2016-04-27 DIAGNOSIS — S43492A Other sprain of left shoulder joint, initial encounter: Secondary | ICD-10-CM

## 2016-04-27 DIAGNOSIS — R7989 Other specified abnormal findings of blood chemistry: Secondary | ICD-10-CM | POA: Insufficient documentation

## 2016-04-27 DIAGNOSIS — N63 Unspecified lump in unspecified breast: Secondary | ICD-10-CM

## 2016-04-27 DIAGNOSIS — S43432A Superior glenoid labrum lesion of left shoulder, initial encounter: Secondary | ICD-10-CM | POA: Diagnosis not present

## 2016-04-27 DIAGNOSIS — M25312 Other instability, left shoulder: Secondary | ICD-10-CM | POA: Diagnosis not present

## 2016-04-27 NOTE — Progress Notes (Signed)
Erik White is a 20 y.o. male who presents to [facility]: [place] today for MRI arthrogram injection and discuss recent labs to evaluate for gynecomastia.  Patient was seen recently and found to have a nipple mass thought to be gynecomastia. Labs were obtained and showed low testosterone. Repeat labs have been ordered but not yet obtained. He feels asymptomatic.    Past Medical History:  Diagnosis Date  . GERD (gastroesophageal reflux disease)   . Mesenteric adenitis 2005   Past Surgical History:  Procedure Laterality Date  . WISDOM TOOTH EXTRACTION     Social History  Substance Use Topics  . Smoking status: Never Smoker  . Smokeless tobacco: Not on file  . Alcohol use No   family history includes Cancer in his other and other; Heart attack in his father; Hypertension in his mother; Thyroid disease in his mother.  ROS as above:  Medications: Current Outpatient Prescriptions  Medication Sig Dispense Refill  . diclofenac sodium (VOLTAREN) 1 % GEL Apply 4 g topically 4 (four) times daily. To affected joint. 100 g 11  . ibuprofen (ADVIL,MOTRIN) 200 MG tablet Take 200 mg by mouth as needed.     No current facility-administered medications for this visit.    No Known Allergies   Exam:  BP 134/80   Pulse 73   Wt 214 lb (97.1 kg)   BMI 28.23 kg/m  Gen: Well NAD Skin left shoulder no erythema or cellulitis.           Patient presents to clinic for  previously scheduled gadolinium interarticular contrast.  Procedure: Real-time Ultrasound Guided Injection of Left Shoulder  Device: GE Logiq E  Images permanently stored and available for review in the ultrasound unit. Verbal informed consent obtained. Discussed risks and benefits of procedure. Warned about infection bleeding damage to structures skin hypopigmentation and fat atrophy among others. Patient  expresses understanding and agreement Time-out conducted.  Noted no overlying erythema, induration, or other signs of local infection.  Skin prepped in a sterile fashion.  Local anesthesia: Topical Ethyl chloride.  With sterile technique and under real time ultrasound guidance: 5 mL of Marcaine. 0.01 mL gadolinium, and 7 mL of sterile saline injected easily.  Completed without difficulty    Advised to call if fevers/chills, erythema, induration, drainage, or persistent bleeding.  Images permanently stored and available for review in the ultrasound unit.  Impression: Technically successful ultrasound guided injection.  Lab Results  Component Value Date   TESTOSTERONE 238 (L) 04/20/2016      Chemistry      Component Value Date/Time   NA 141 04/20/2016 0906   K 4.0 04/20/2016 0906   CL 105 04/20/2016 0906   CO2 25 04/20/2016 0906   BUN 16 04/20/2016 0906   CREATININE 1.00 04/20/2016 0906      Component Value Date/Time   CALCIUM 9.5 04/20/2016 0906   ALKPHOS 89 04/20/2016 0906   AST 61 (H) 04/20/2016 0906   ALT 28 04/20/2016 0906   BILITOT 0.6 04/20/2016 0906       No results found for this or any previous visit (from the past 24 hour(s)). No results found.    Assessment and Plan: 20 y.o. male with   Left shoulder: Injection performed for MRI arthrogram later today  Gynecomastia: Patient has low testosterone. Plan to obtain more dedicated testing including repeat free and total testosterone, T3-T4 TSH beta-hCG estrogens and LH.  Patient will follow-up  later this week to go over the results  Discussed warning signs or symptoms. Please see discharge instructions. Patient expresses understanding.

## 2016-04-28 ENCOUNTER — Encounter: Payer: Self-pay | Admitting: Family Medicine

## 2016-04-28 DIAGNOSIS — R7989 Other specified abnormal findings of blood chemistry: Secondary | ICD-10-CM | POA: Insufficient documentation

## 2016-04-28 LAB — TESTOSTERONE TOTAL,FREE,BIO, MALES
ALBUMIN: 4.8 g/dL (ref 3.6–5.1)
SEX HORMONE BINDING: 22 nmol/L (ref 10–50)
TESTOSTERONE BIOAVAILABLE: 120.1 ng/dL (ref 44.0–417.0)
TESTOSTERONE FREE: 54.9 pg/mL (ref 0.6–159.0)
TESTOSTERONE: 323 ng/dL (ref 250–827)

## 2016-04-28 LAB — TSH: TSH: 5.55 mIU/L — ABNORMAL HIGH (ref 0.50–4.30)

## 2016-04-28 LAB — T4, FREE: Free T4: 1.4 ng/dL (ref 0.8–1.4)

## 2016-04-28 LAB — HCG, QUANTITATIVE, PREGNANCY: hCG, Beta Chain, Quant, S: <2 m[IU]/mL

## 2016-04-28 LAB — T3, FREE: T3, Free: 3.9 pg/mL (ref 3.0–4.7)

## 2016-04-28 LAB — LUTEINIZING HORMONE: LH: 2.7 m[IU]/mL (ref 1.5–9.3)

## 2016-04-28 NOTE — Addendum Note (Signed)
Addended by: Gregor Hams on: 04/28/2016 07:35 AM   Modules accepted: Orders

## 2016-05-01 ENCOUNTER — Ambulatory Visit (INDEPENDENT_AMBULATORY_CARE_PROVIDER_SITE_OTHER): Payer: 59 | Admitting: Family Medicine

## 2016-05-01 VITALS — BP 145/79 | HR 69 | Wt 219.0 lb

## 2016-05-01 DIAGNOSIS — N63 Unspecified lump in unspecified breast: Secondary | ICD-10-CM

## 2016-05-01 DIAGNOSIS — E291 Testicular hypofunction: Secondary | ICD-10-CM

## 2016-05-01 DIAGNOSIS — R7989 Other specified abnormal findings of blood chemistry: Secondary | ICD-10-CM

## 2016-05-01 DIAGNOSIS — M25312 Other instability, left shoulder: Secondary | ICD-10-CM

## 2016-05-01 DIAGNOSIS — S43432A Superior glenoid labrum lesion of left shoulder, initial encounter: Secondary | ICD-10-CM | POA: Diagnosis not present

## 2016-05-01 DIAGNOSIS — R946 Abnormal results of thyroid function studies: Secondary | ICD-10-CM

## 2016-05-01 DIAGNOSIS — S43439A Superior glenoid labrum lesion of unspecified shoulder, initial encounter: Secondary | ICD-10-CM | POA: Insufficient documentation

## 2016-05-01 LAB — ESTROGENS, TOTAL: Estrogen: 166.4 pg/mL (ref 60–190)

## 2016-05-01 NOTE — Progress Notes (Signed)
Erik White is a 20 y.o. male who presents to [name] today for follow-up MRI and laboratory testing.  Patient notes persistent left shoulder pain and instability. He had MRI arthrogram on Monday that showed significant labral tears as well as a SLAP lesion.   Additionally he notes a left nipple mass thought to be gynecomastia. Laboratory workup so far showed decreased testosterone as well as mildly elevated TSH. Based on laboratory findings he has already been referred to endocrinology and has an appointment at the end of August. He notes the nipple mass has not changed.   Past Medical History:  Diagnosis Date  . GERD (gastroesophageal reflux disease)   . Mesenteric adenitis 2005   Past Surgical History:  Procedure Laterality Date  . WISDOM TOOTH EXTRACTION     Social History  Substance Use Topics  . Smoking status: Never Smoker  . Smokeless tobacco: Not on file  . Alcohol use No   family history includes Cancer in his other and other; Heart attack in his father; Hypertension in his mother; Thyroid disease in his mother.  ROS as above:  Medications: Current Outpatient Prescriptions  Medication Sig Dispense Refill  . diclofenac sodium (VOLTAREN) 1 % GEL Apply 4 g topically 4 (four) times daily. To affected joint. 100 g 11  . ibuprofen (ADVIL,MOTRIN) 200 MG tablet Take 200 mg by mouth as needed.     No current facility-administered medications for this visit.    No Known Allergies   Exam:  BP (!) 145/79   Pulse 69   Wt 219 lb (99.3 kg)   BMI 28.89 kg/m  Gen: Well NAD  Mr Shoulder Left W Contrast  Result Date: 04/27/2016 CLINICAL DATA:  Instability of the left shoulder joint. Multiple prior dislocations. Decreased range of motion. EXAM: MR ARTHROGRAM OF THE LEFT SHOULDER TECHNIQUE: Multiplanar, multisequence MR imaging of the left shoulder  was performed following the administration of intra-articular contrast. CONTRAST:  See Injection Documentation. COMPARISON:  Radiographs dated 04/20/2016 FINDINGS: Rotator cuff: Intact. Muscles: Normal. Biceps long head: Properly located and intact. Acromioclavicular Joint: Normal. Glenohumeral Joint: No degenerative changes. Labrum: SLAP tear. Tear of the posterior and inferior aspect of the labrum from 3 o'clock to 6 o'clock. A portion of the torn labrum is flipped into the axillary recess best seen on images 10 of series 4 and 5. Bones: Old Hill-Sachs lesion.  No bony Bankart lesion. IMPRESSION: Extensive labral tears including a SLAP tear and a tear of the labrum from 3:00 to 6:00. [place] Hill-Sachs lesion. Electronically Signed   By: Lorriane Shire M.D.   On: 04/27/2016 10:22  Lab Results  Component Value Date   TESTOSTERONE 323 04/27/2016    Lab Results  Component Value Date   TSH 5.55 (H) 04/27/2016     No results found for this or any previous visit (from the past 24 hour(s)). No results found.    Assessment and Plan: 20 y.o. male with  1) left shoulder instability: Due to labrum tearing and SLAP lesion. Discussed options with family. They request referral to Dr. Onnie Graham. Order placed.  2) breast mass with associated abnormal endocrine findings: Endocrinology referral already placed. Follow-up as needed.   Orders Placed This Encounter  Procedures  . Ambulatory referral to Orthopedic Surgery    Referral Priority:   Routine    Referral Type:   Surgical    Referral Reason:   Specialty Services Required  Referred to Provider:   Justice Britain, MD    Requested Specialty:   Orthopedic Surgery    Number of Visits Requested:   1    Discussed warning signs or symptoms. Please see discharge instructions. Patient expresses understanding.

## 2016-05-01 NOTE — Patient Instructions (Signed)
Thank you for coming in today. We will refer to Dr Onnie Graham.  You should hear from his office next week.  Let me know if you do not hear anything.    Slap Lesions With Rehab Slap is an acronym for "Superior Labrum, Anterior to Posterior." A slap lesion is a tear from the front to back (anterior to posterior) in the top (superior) labrum of the glenoid fossa. The glenoid fossa is the socket of the shoulder joint. The shoulder joint is surrounded by a rim of cartilage called the labrum. The labrum makes the socket of the joint deeper and stabilizes the shoulder (glenohumeral) joint. The top portion of the labrum is the location where the long end of the biceps tendon attaches, which is important for bending the elbow and raising the upper arm. Slap tears are characterized by shoulder pain and are uncommon. SYMPTOMS   Shoulder pain, especially the front of the shoulder.  Pain that worsens with activities that involve arm movements above shoulder height.  Mechanical problems (clicking, locking, or snapping) of the shoulder.  Decreased shoulder function.  A crackling sound (crepitation) when the biceps tendon or shoulder is moved or touched. CAUSES  Slap lesions are caused by forces being placed on the labrum that are stronger than it can withstand. Common mechanisms of injury include:  Repetitive and/or strenuous use of the shoulder joint.  Falling on an outstretched arm.  Direct trauma to the shoulder.  Sudden force placed on the arm while the biceps muscle is contracted. RISK INCREASES WITH:  Contact sports (football, rugby, lacrosse, or soccer).  Activities that involve arm movements above shoulder height.  Previous shoulder injury.  Poor strength and flexibility.  Failure to warm up properly before activity. PREVENTION  Warm up and stretch properly before activity.  Maintain physical fitness:  Strength, flexibility, and endurance.  Cardiovascular fitness.  Learn and  use proper technique, especially when throwing. When possible, have a coach correct improper technique. PROGNOSIS  The symptoms of a slap tear may resolve with nonsurgical (conservative) treatment. However, definitive treatment requires surgery.  RELATED COMPLICATIONS   Recurrent symptoms that result in a chronic problem.  Inability to compete in athletics.  Prolonged healing time, if improperly treated or reinjured.  Upper extremity weakness.  Risks of surgery: infection, bleeding, nerve damage, or damage to surrounding tissues. TREATMENT Treatment initially involves the use of ice and medication to help reduce pain and inflammation. The use of strengthening and stretching exercises may help reduce pain with activity. These exercises may be performed at home or with referral to a therapist. Surgery is usually recommended for fixing slap lesions. The surgery involves reattaching the labrum to the glenoid fossa. After surgery it is necessary to immobilize the shoulder for a period of time. After immobilization it is important to perform strengthening and stretching exercises to help regain strength and a full range of motion. MEDICATION   If pain medication is necessary, then nonsteroidal anti-inflammatory medications, such as aspirin and ibuprofen, or other minor pain relievers, such as acetaminophen, are often recommended.  Do not take pain medication for 7 days before surgery.  Prescription pain relievers may be given if deemed necessary by your caregiver. Use only as directed and only as much as you need. HEAT AND COLD  Cold treatment (icing) relieves pain and reduces inflammation. Cold treatment should be applied for 10 to 15 minutes every 2 to 3 hours for inflammation and pain and immediately after any activity that aggravates your symptoms. Use  ice packs or massage the area with a piece of ice (ice massage).  Heat treatment may be used prior to performing the stretching and  strengthening activities prescribed by your caregiver, physical therapist, or athletic trainer. Use a heat pack or a warm soak. SEEK MEDICAL CARE IF:  Treatment seems to offer no benefit, or the condition worsens.  Any medications produce adverse side effects.  Any complications from surgery occur:  Pain, numbness, or coldness in the extremity operated upon.  Discoloration of the nail beds (they become blue or gray) of the side operated upon. EXERCISES RANGE OF MOTION (ROM) AND STRETCHING EXERCISES - SLAP Lesions These exercises may help you when beginning to rehabilitate your injury. Your symptoms may resolve with or without further involvement from your physician, physical therapist or athletic trainer. While completing these exercises, remember:   Restoring tissue flexibility helps normal motion to return to the joints. This allows healthier, less painful movement and activity.  An effective stretch should be held for at least 30 seconds.  A stretch should never be painful. You should only feel a gentle lengthening or release in the stretched tissue. STRETCH - Flexion, Seated  Sit in a firm chair so that your right / left forearm can rest on a table or on a table or countertop. Your right / left elbow should rest below the height of your shoulder so that your shoulder feels supported and not tense or uncomfortable.  Keeping your right / left shoulder relaxed, lean forward at your waist, allowing your right / left hand to slide forward. Bend forward until you feel a moderate stretch in your shoulder, but before you feel an increase in your pain.  Hold __________ seconds. Slowly return to your starting position. Repeat __________ times. Complete this exercise __________ times per day.  STRETCH - Flexion, Standing  Stand with good posture. With an underhand grip on your right / left and an overhand grip on the opposite hand, grasp a broomstick or cane so that your hands are a little  more than shoulder-width apart.  Keeping your right / left elbow straight and shoulder muscles relaxed, push the stick with your opposite hand to raise your right / left arm in front of your body and then overhead. Raise your arm until you feel a stretch in your right / left shoulder, but before you have increased shoulder pain.  Try to avoid shrugging your right / left shoulder as your arm rises by keeping your shoulder blade tucked down and toward your mid-back spine. Hold __________ seconds.  Slowly return to the starting position. Repeat __________ times. Complete this exercise __________ times per day. STRETCH - Abduction, Supine  Stand with good posture. With an underhand grip on your right / left and an overhand grip on the opposite hand, grasp a broomstick or cane so that your hands are a little more than shoulder-width apart.  Keeping your right / left elbow straight and shoulder muscles relaxed, push the stick with your opposite hand to raise your right / left arm out to the side of your body and then overhead. Raise your arm until you feel a stretch in your right / left shoulder, but before you have increased shoulder pain.  Try to avoid shrugging your right / left shoulder as your arm rises by keeping your shoulder blade tucked down and toward your mid-back spine. Hold __________ seconds.  Slowly return to the starting position. Repeat __________ times. Complete this exercise __________ times per day.  ROM - Flexion, Active-Assisted  Lie on your back. You may bend your knees for comfort.  Grasp a broomstick or cane so your hands are about shoulder-width apart. Your right / left hand should grip the end of the stick/cane so that your hand is positioned "thumbs-up," as if you were about to shake hands.  Using your healthy arm to lead, raise your right / left arm overhead until you feel a gentle stretch in your shoulder. Hold __________ seconds.  Use the stick/cane to assist in  returning your right / left arm to its starting position. Repeat __________ times. Complete this exercise __________ times per day.   STRETCH - External Rotation  Tuck a folded towel or small ball under your right / left upper arm. Grasp a broomstick or cane with an underhand grasp a little more than shoulder width apart. Bend your elbows to 90 degrees.  Stand with good posture or sit in a firm chair without arms.  Use your strong arm to push the stick across your body. Do not allow the towel or ball to fall. This will rotate your right / left arm away from your abdomen. Using the stick turn/rotate your hand and forearm away from your body. Hold __________ seconds. Repeat __________ times. Complete this exercise __________ times per day.  ROM - Internal Rotation, Supine  Lie on your back on a firm surface. Place your right / left elbow about 60 degrees away from your side. Elevate your elbow with a folded towel so that the elbow and shoulder are the same height.  Using a broomstick or cane and your strong arm, pull your right / left hand toward your body until you feel a gentle stretch, but no increase in your shoulder pain. Keep your shoulder and elbow in place throughout the exercise.  Hold __________. Slowly return to the starting position. Repeat ____________________ times. Complete this exercise ____________________ times per day. STRETCH - Flexion, Standing  Stand facing a wall. Walk your right / left fingers up the wall until you feel a moderate stretch in your shoulder. As your hand gets higher, you may need to step closer to the wall or use a door frame to walk through.  Try to avoid shrugging your right / left shoulder as your arm rises by keeping your shoulder blade tucked down and toward your mid-back spine.  Hold __________ seconds. Use your other hand, if needed, to ease out of the stretch and return to the starting position. Repeat __________ times. Complete this exercise  __________ times per day.  STRENGTHENING EXERCISES - SLAP Lesions These exercises may help you when beginning to rehabilitate your injury. They may resolve your symptoms with or without further involvement from your physician, physical therapist or athletic trainer. While completing these exercises, remember:   Muscles can gain both the endurance and the strength needed for everyday activities through controlled exercises.  Complete these exercises as instructed by your physician, physical therapist or athletic trainer. Progress with the resistance and repetition exercises only as your caregiver advises.  You may experience muscle soreness or fatigue, but the pain or discomfort you are trying to eliminate should never worsen during these exercises. If this pain does worsen, stop and make certain you are following the directions exactly. If the pain is still present after adjustments, discontinue the exercise until you can discuss the trouble with your clinician. STRENGTH - Shoulder Abductors, Isometric  With good posture, stand or sit about 4-6 inches from a wall with  your right / left side facing the wall.  Bend your right / left elbow. Gently press your right / left elbow into the wall. Increase the pressure gradually until you are pressing as hard as you can without shrugging your shoulder or increasing any shoulder discomfort.  Hold __________ seconds.  Release the tension slowly. Relax your shoulder muscles completely before you do the next repetition. Repeat __________ times. Complete this exercise __________ times per day.  STRENGTH - External Rotators, Isometric  Keep your right / left elbow at your side and bend it 90 degrees.  Step into a door frame so that the outside of your right / left wrist can press against the door frame without your upper arm leaving your side.  Gently press your right / left wrist into the door frame as if you were trying to swing the back of your hand  away from your abdomen. Gradually increase the tension until you are pressing as hard as you can without shrugging your shoulder or increasing any shoulder discomfort.  Hold __________ seconds.  Release the tension slowly. Relax your shoulder muscles completely before you do the next repetition. Repeat __________ times. Complete this exercise __________ times per day.  STRENGTH - Internal Rotators  Secure a rubber exercise band/tubing to a fixed object so that it is at the same height as your right / left elbow when you are standing or sitting on a firm surface.  Stand or sit so that the secured exercise band/tubing is at your right / left side.  Bend your elbow 90 degrees. Place a folded towel or small pillow under your right / left arm so that your elbow is a few inches away from your side.  Keeping the tension on the exercise band/tubing, pull it across your body toward your abdomen. Be sure to keep your body steady so that the movement is only coming from your shoulder rotating.  Hold __________ seconds. Release the tension in a controlled manner as you return to the starting position. Repeat __________ times. Complete this exercise __________ times per day.  STRENGTH - External Rotators  Secure a rubber exercise band/tubing to a fixed object so that it is at the same height as your right / left elbow when you are standing or sitting on a firm surface.  Stand or sit so that the secured exercise band/tubing is at your side that is not injured.  Bend your elbow 90 degrees. Place a folded towel or small pillow under your right / left arm so that your elbow is a few inches away from your side.  Keeping the tension on the exercise band/tubing, pull it away from your body, as if pivoting on your elbow. Be sure to keep your body steady so that the movement is only coming from your shoulder rotating.  Hold __________ seconds. Release the tension in a controlled manner as you return to the  starting position. Repeat __________ times. Complete this exercise __________ times per day.  STRENGTH - Shoulder Extensors  Secure a rubber exercise band/tubing so that it is at the height of your shoulders when you are either standing or sitting on a firm arm-less chair.  With a thumbs-up grip, grasp an end of the band/tubing in each hand. Straighten your elbows and lift your hands straight in front of you at shoulder height. Step back away from the secured end of band/tubing until it becomes tense.  Squeezing your shoulder blades together, pull your hands down to the sides  of your thighs. Do not allow your hands to go behind you.  Hold for __________ seconds. Slowly ease the tension on the band/tubing as you reverse the directions and return to the starting position. Repeat __________ times. Complete this exercise __________ times per day.  STRENGTH - Scapular Protractors, Quadruped  Get onto your hands and knees with your shoulders directly over your hands (or as close as you comfortably can be).  Keeping your elbows locked, lift the back of your rib cage up into your shoulder blades so your mid-back rounds-out. Keep your neck muscles relaxed.  Hold this position for __________ seconds. Slowly return to the starting position and allow your muscles to relax completely before completing the next repetition. Repeat __________ times. Complete this exercise __________ times per day.  STRENGTH - Scapular Depressors  Find a sturdy chair without wheels, such as a from a dining room table.  Keeping your feet on the floor, lift your bottom from the seat and lock your elbows.  Keeping your elbows straight, allow gravity to pull your body weight down. Your shoulders will rise toward your ears.  Raise your body against gravity by drawing your shoulder blades down your back, shortening the distance between your shoulders and ears. Although your feet should always maintain contact with the floor,  your feet should progressively support less body weight as you get stronger.  Hold __________ seconds. In a controlled and slow manner, lower your body weight to begin the next repetition. Repeat __________ times. Complete this exercise __________ times per day.  STRENGTH - Shoulder Flexion  Stand or sit with good posture. Grasp a __________ weight or an exercise band/tubing so that your hand is "thumbs-up," like when you shake hands.  Slowly lift your right / left arm as far as you can without increasing any shoulder pain. Initially, many people can only raise their hand to shoulder height.  Avoid shrugging your right / left shoulder as your arm rises by keeping your shoulder blade tucked down and toward your mid-back spine.  Hold for __________ seconds. Control the descent of your hand as you slowly return to your starting position. Repeat __________ times. Complete this exercise __________ times per day.  STRENGTH - Supraspinatus  Stand or sit with good posture. Grasp a __________ weight or an exercise band/tubing so that your hand is "thumbs-up," like when you shake hands.  Slowly lift your right / left hand from your thigh into the air, traveling about 30 degrees from straight out at your side. Lift your hand to shoulder height or as far as you can without increasing any shoulder pain. Initially, many people do not lift their hands above shoulder height.  Avoid shrugging your right / left shoulder as your arm rises by keeping your shoulder blade tucked down and toward your mid-back spine.  Hold for __________ seconds. Control the descent of your hand as you slowly return to your starting position. Repeat __________ times. Complete this exercise __________ times per day.  STRENGTH - Shoulder Abductors  Stand or sit with good posture. Place your right / left arm at your side.  With a thumbs-up grasp, hold a __________ weight or a secured rubber exercise band/tubing in your right / left  hand. Slowly lift your arm from your side as far as you can without reproducing any of your shoulder pain. Do not lift your hand above shoulder-height unless you have been instructed to do so by your physician, physical therapist or athletic trainer. If this  motion causes pain or increased discomfort, discuss this with your physician, physical therapist, or athletic trainer.  Avoid shrugging your right / left shoulder as your arm rises by keeping your shoulder blade tucked down and toward your mid-back spine. Hold __________ seconds. Release the tension in a controlled manner as you return to the starting position. Repeat __________ times. Complete this exercise __________ times per day. STRENGTH - Scapular Retractors and External Rotators, Rowing  Secure a rubber exercise band/tubing so that it is at the height of your shoulders when you are either standing or sitting on a firm arm-less chair.  With a palm-down grip, grasp an end of the band/tubing in each hand. Straighten your elbows and lift your hands straight in front of you at shoulder height. Step back away from the secured end of band/tubing until it becomes tense.  Step 1: Squeeze your shoulder blades together. Bending your elbows, draw your hands to your chest as if you are rowing a boat. At the end of this motion, your hands and elbow should be at shoulder-height and your elbows should be out to your sides.  Step 2: Rotate your shoulder to raise your hands above your head. Your forearms should be vertical and your upper-arms should be horizontal.  Hold for __________ seconds. Slowly ease the tension on the band/tubing as you reverse the directions and return to the starting position. Repeat __________ times. Complete this exercise __________ times per day.    This information is not intended to replace advice given to you by your health care provider. Make sure you discuss any questions you have with your health care provider.     Document Released: 09/21/2005 Document Revised: 02/05/2015 Document Reviewed: 01/03/2009 [employer] Interactive Patient Education [employer].

## 2016-05-11 DIAGNOSIS — M25511 Pain in right shoulder: Secondary | ICD-10-CM | POA: Diagnosis not present

## 2016-05-11 DIAGNOSIS — M25311 Other instability, right shoulder: Secondary | ICD-10-CM | POA: Diagnosis not present

## 2016-05-14 ENCOUNTER — Encounter: Payer: Self-pay | Admitting: Family Medicine

## 2016-05-27 ENCOUNTER — Encounter (HOSPITAL_COMMUNITY): Payer: Self-pay | Admitting: *Deleted

## 2016-05-27 NOTE — Progress Notes (Signed)
Spoke with pt for pre-op call. Denies any cardiac history.

## 2016-05-28 ENCOUNTER — Ambulatory Visit (HOSPITAL_COMMUNITY): Payer: 59 | Admitting: Anesthesiology

## 2016-05-28 ENCOUNTER — Encounter (HOSPITAL_COMMUNITY)
Admission: RE | Disposition: A | Discharge status: Home / Self Care | Payer: Self-pay | Source: Ambulatory Visit | Attending: Orthopedic Surgery

## 2016-05-28 ENCOUNTER — Encounter (HOSPITAL_COMMUNITY): Payer: Self-pay | Admitting: *Deleted

## 2016-05-28 ENCOUNTER — Ambulatory Visit (HOSPITAL_COMMUNITY)
Admission: RE | Admit: 2016-05-28 | Discharge: 2016-05-28 | Disposition: A | Discharge status: Home / Self Care | Payer: 59 | Source: Ambulatory Visit | Attending: Orthopedic Surgery | Admitting: Orthopedic Surgery

## 2016-05-28 DIAGNOSIS — S43431D Superior glenoid labrum lesion of right shoulder, subsequent encounter: Secondary | ICD-10-CM | POA: Diagnosis not present

## 2016-05-28 DIAGNOSIS — K219 Gastro-esophageal reflux disease without esophagitis: Secondary | ICD-10-CM | POA: Insufficient documentation

## 2016-05-28 DIAGNOSIS — R600 Localized edema: Secondary | ICD-10-CM | POA: Diagnosis not present

## 2016-05-28 DIAGNOSIS — Z808 Family history of malignant neoplasm of other organs or systems: Secondary | ICD-10-CM | POA: Diagnosis not present

## 2016-05-28 DIAGNOSIS — S46812A Strain of other muscles, fascia and tendons at shoulder and upper arm level, left arm, initial encounter: Secondary | ICD-10-CM | POA: Diagnosis not present

## 2016-05-28 DIAGNOSIS — S43432A Superior glenoid labrum lesion of left shoulder, initial encounter: Secondary | ICD-10-CM | POA: Insufficient documentation

## 2016-05-28 DIAGNOSIS — X58XXXA Exposure to other specified factors, initial encounter: Secondary | ICD-10-CM | POA: Insufficient documentation

## 2016-05-28 DIAGNOSIS — M25312 Other instability, left shoulder: Secondary | ICD-10-CM | POA: Insufficient documentation

## 2016-05-28 DIAGNOSIS — G8918 Other acute postprocedural pain: Secondary | ICD-10-CM | POA: Diagnosis not present

## 2016-05-28 DIAGNOSIS — S43012A Anterior subluxation of left humerus, initial encounter: Secondary | ICD-10-CM | POA: Diagnosis not present

## 2016-05-28 DIAGNOSIS — M25311 Other instability, right shoulder: Secondary | ICD-10-CM | POA: Diagnosis not present

## 2016-05-28 HISTORY — DX: Headache, unspecified: R51.9

## 2016-05-28 HISTORY — PX: SHOULDER ARTHROSCOPY WITH LABRAL REPAIR: SHX5691

## 2016-05-28 HISTORY — DX: Headache: R51

## 2016-05-28 LAB — CBC
HCT: 46.3 % (ref 39.0–52.0)
Hemoglobin: 15.7 g/dL (ref 13.0–17.0)
MCH: 29.8 pg (ref 26.0–34.0)
MCHC: 33.9 g/dL (ref 30.0–36.0)
MCV: 88 fL (ref 78.0–100.0)
Platelets: 184 K/uL (ref 150–400)
RBC: 5.26 MIL/uL (ref 4.22–5.81)
RDW: 13.2 % (ref 11.5–15.5)
WBC: 8.6 K/uL (ref 4.0–10.5)

## 2016-05-28 SURGERY — ARTHROSCOPY, SHOULDER, WITH GLENOID LABRUM REPAIR
Anesthesia: Regional | Site: Shoulder | Laterality: Left

## 2016-05-28 MED ORDER — HYDROMORPHONE HCL 1 MG/ML IJ SOLN: 0.2500 mg | INTRAMUSCULAR | Status: DC | PRN

## 2016-05-28 MED ORDER — SUGAMMADEX SODIUM 200 MG/2ML IV SOLN
INTRAVENOUS | Status: DC | PRN
  Administered 2016-05-28: 200 mg via INTRAVENOUS

## 2016-05-28 MED ORDER — ONDANSETRON HCL 4 MG/2ML IJ SOLN
INTRAMUSCULAR | Status: AC
  Filled 2016-05-28: qty 2

## 2016-05-28 MED ORDER — FENTANYL CITRATE (PF) 100 MCG/2ML IJ SOLN
INTRAMUSCULAR | Status: AC
  Filled 2016-05-28: qty 2

## 2016-05-28 MED ORDER — LIDOCAINE HCL 4 % EX SOLN
CUTANEOUS | Status: DC | PRN
  Administered 2016-05-28: 3 mL via TOPICAL

## 2016-05-28 MED ORDER — PROPOFOL 10 MG/ML IV BOLUS
INTRAVENOUS | Status: DC | PRN
  Administered 2016-05-28: 50 mg via INTRAVENOUS
  Administered 2016-05-28: 200 mg via INTRAVENOUS

## 2016-05-28 MED ORDER — LIDOCAINE HCL (CARDIAC) 20 MG/ML IV SOLN
INTRAVENOUS | Status: DC | PRN
  Administered 2016-05-28: 100 mg via INTRAVENOUS

## 2016-05-28 MED ORDER — ARTIFICIAL TEARS OP OINT
TOPICAL_OINTMENT | OPHTHALMIC | Status: DC | PRN
  Administered 2016-05-28: 1 via OPHTHALMIC

## 2016-05-28 MED ORDER — LIDOCAINE 2% (20 MG/ML) 5 ML SYRINGE
INTRAMUSCULAR | Status: AC
  Filled 2016-05-28: qty 30

## 2016-05-28 MED ORDER — SUGAMMADEX SODIUM 200 MG/2ML IV SOLN
INTRAVENOUS | Status: AC
  Filled 2016-05-28: qty 2

## 2016-05-28 MED ORDER — EPHEDRINE 5 MG/ML INJ
INTRAVENOUS | Status: AC
  Filled 2016-05-28: qty 10

## 2016-05-28 MED ORDER — ROCURONIUM BROMIDE 100 MG/10ML IV SOLN
INTRAVENOUS | Status: DC | PRN
  Administered 2016-05-28: 60 mg via INTRAVENOUS
  Administered 2016-05-28: 10 mg via INTRAVENOUS

## 2016-05-28 MED ORDER — FENTANYL CITRATE (PF) 100 MCG/2ML IJ SOLN
100.0000 ug | Freq: Once | INTRAMUSCULAR | Status: AC
  Administered 2016-05-28: 100 ug via INTRAVENOUS

## 2016-05-28 MED ORDER — CHLORHEXIDINE GLUCONATE 4 % EX LIQD: 60.0000 mL | Freq: Once | CUTANEOUS | Status: DC

## 2016-05-28 MED ORDER — 0.9 % SODIUM CHLORIDE (POUR BTL) OPTIME
TOPICAL | Status: DC | PRN
  Administered 2016-05-28: 1000 mL

## 2016-05-28 MED ORDER — CEFAZOLIN SODIUM-DEXTROSE 2-4 GM/100ML-% IV SOLN
INTRAVENOUS | Status: AC
  Filled 2016-05-28: qty 100

## 2016-05-28 MED ORDER — ONDANSETRON HCL 4 MG PO TABS: 4.0000 mg | ORAL_TABLET | Freq: Three times a day (TID) | ORAL | 0 refills | Status: DC | PRN

## 2016-05-28 MED ORDER — NAPROXEN 500 MG PO TABS: 500.0000 mg | ORAL_TABLET | Freq: Two times a day (BID) | ORAL | 1 refills | Status: DC

## 2016-05-28 MED ORDER — PROMETHAZINE HCL 25 MG/ML IJ SOLN: 6.2500 mg | INTRAMUSCULAR | Status: DC | PRN

## 2016-05-28 MED ORDER — LACTATED RINGERS IV SOLN
INTRAVENOUS | Status: DC
  Administered 2016-05-28 (×2): via INTRAVENOUS

## 2016-05-28 MED ORDER — MIDAZOLAM HCL 2 MG/2ML IJ SOLN
INTRAMUSCULAR | Status: AC
  Filled 2016-05-28: qty 2

## 2016-05-28 MED ORDER — BUPIVACAINE-EPINEPHRINE (PF) 0.5% -1:200000 IJ SOLN
INTRAMUSCULAR | Status: DC | PRN
  Administered 2016-05-28: 30 mL via PERINEURAL

## 2016-05-28 MED ORDER — SODIUM CHLORIDE 0.9 % IR SOLN
Status: DC | PRN
  Administered 2016-05-28 (×3): 3000 mL

## 2016-05-28 MED ORDER — CEFAZOLIN SODIUM-DEXTROSE 2-4 GM/100ML-% IV SOLN
2.0000 g | INTRAVENOUS | Status: AC
  Administered 2016-05-28: 2 g via INTRAVENOUS

## 2016-05-28 MED ORDER — FENTANYL CITRATE (PF) 100 MCG/2ML IJ SOLN: 50.0000 ug | INTRAMUSCULAR | Status: DC | PRN

## 2016-05-28 MED ORDER — OXYCODONE-ACETAMINOPHEN 5-325 MG PO TABS: 1.0000 | ORAL_TABLET | ORAL | 0 refills | Status: DC | PRN

## 2016-05-28 MED ORDER — MEPERIDINE HCL 25 MG/ML IJ SOLN: 6.2500 mg | INTRAMUSCULAR | Status: DC | PRN

## 2016-05-28 MED ORDER — ONDANSETRON HCL 4 MG/2ML IJ SOLN
INTRAMUSCULAR | Status: DC | PRN
  Administered 2016-05-28: 4 mg via INTRAVENOUS

## 2016-05-28 MED ORDER — MIDAZOLAM HCL 2 MG/2ML IJ SOLN
2.0000 mg | Freq: Once | INTRAMUSCULAR | Status: AC
  Administered 2016-05-28: 2 mg via INTRAVENOUS

## 2016-05-28 MED ORDER — FENTANYL CITRATE (PF) 100 MCG/2ML IJ SOLN
INTRAMUSCULAR | Status: DC | PRN
  Administered 2016-05-28: 50 ug via INTRAVENOUS

## 2016-05-28 MED ORDER — DIAZEPAM 5 MG PO TABS: 2.5000 mg | ORAL_TABLET | Freq: Four times a day (QID) | ORAL | 1 refills | Status: DC | PRN

## 2016-05-28 MED ORDER — MIDAZOLAM HCL 2 MG/2ML IJ SOLN: 1.0000 mg | INTRAMUSCULAR | Status: DC | PRN

## 2016-05-28 MED FILL — ONDANSETRON HCL 4 MG TABLET: 4 | 7 days supply | Qty: 20 | Fill #0

## 2016-05-28 MED FILL — NAPROXEN 500 MG TABLET: 500 | 30 days supply | Qty: 60 | Fill #0

## 2016-05-28 MED FILL — OXYCODONE/APAP 5-325: 5-325 | 3 days supply | Qty: 40 | Fill #0

## 2016-05-28 MED FILL — diazePAM 5 MG TABS: 5 | 10 days supply | Qty: 40 | Fill #0

## 2016-05-28 SURGICAL SUPPLY — 59 items
ANCHOR JUGGERKNOT SZ1 (Anchor) ×10 IMPLANT
BENZOIN TINCTURE PRP APPL 2/3 (GAUZE/BANDAGES/DRESSINGS) ×2 IMPLANT
BLADE CUTTER GATOR 3.5 (BLADE) ×4 IMPLANT
BLADE GREAT WHITE 4.2 (BLADE) ×2 IMPLANT
BLADE SURG 11 STRL SS (BLADE) ×2 IMPLANT
BOOTCOVER CLEANROOM LRG (PROTECTIVE WEAR) ×4 IMPLANT
BUR OVAL 4.0 (BURR) ×2 IMPLANT
CANISTER SUCT LVC 12 LTR MEDI- (MISCELLANEOUS) ×2 IMPLANT
CANNULA ACUFLEX KIT 5X76 (CANNULA) ×2 IMPLANT
CANNULA DRILOCK 5.0X75 (CANNULA) ×10 IMPLANT
CONNECTOR 5 IN 1 STRAIGHT STRL (MISCELLANEOUS) IMPLANT
DRAPE HALF SHEET 40X57 (DRAPES) ×2 IMPLANT
DRAPE INCISE 23X17 IOBAN STRL (DRAPES)
DRAPE INCISE IOBAN 23X17 STRL (DRAPES) IMPLANT
DRAPE STERI 35X30 U-POUCH (DRAPES) ×2 IMPLANT
DRAPE SURG 17X11 SM STRL (DRAPES) ×2 IMPLANT
DRAPE U-SHAPE 47X51 STRL (DRAPES) IMPLANT
DRSG PAD ABDOMINAL 8X10 ST (GAUZE/BANDAGES/DRESSINGS) ×4 IMPLANT
DURAPREP 26ML APPLICATOR (WOUND CARE) ×2 IMPLANT
FLUID NSS /IRRIG 3000 ML XXX (IV SOLUTION) ×2 IMPLANT
GAUZE SPONGE 4X4 12PLY STRL (GAUZE/BANDAGES/DRESSINGS) ×2 IMPLANT
GLOVE BIO SURGEON STRL SZ7.5 (GLOVE) ×2 IMPLANT
GLOVE BIO SURGEON STRL SZ8 (GLOVE) ×2 IMPLANT
GLOVE EUDERMIC 7 POWDERFREE (GLOVE) ×2 IMPLANT
GLOVE SS BIOGEL STRL SZ 7.5 (GLOVE) ×1 IMPLANT
GLOVE SUPERSENSE BIOGEL SZ 7.5 (GLOVE) ×1
GLOVE SURG SS PI 6.5 STRL IVOR (GLOVE) ×2 IMPLANT
GOWN STRL REUS W/ TWL LRG LVL3 (GOWN DISPOSABLE) ×1 IMPLANT
GOWN STRL REUS W/ TWL XL LVL3 (GOWN DISPOSABLE) ×2 IMPLANT
GOWN STRL REUS W/TWL LRG LVL3 (GOWN DISPOSABLE) ×2
GOWN STRL REUS W/TWL XL LVL3 (GOWN DISPOSABLE) ×2
KIT BASIN OR (CUSTOM PROCEDURE TRAY) ×2 IMPLANT
KIT ROOM TURNOVER OR (KITS) ×2 IMPLANT
KIT SHOULDER TRACTION (DRAPES) ×2 IMPLANT
LASSO 45 DEG CVD LEFT (SUTURE) ×2 IMPLANT
MANIFOLD NEPTUNE II (INSTRUMENTS) ×2 IMPLANT
NDL SUT 6 .5 CRC .975X.05 MAYO (NEEDLE) IMPLANT
NEEDLE MAYO TAPER (NEEDLE)
NEEDLE SPNL 18GX3.5 QUINCKE PK (NEEDLE) ×2 IMPLANT
NS IRRIG 1000ML POUR BTL (IV SOLUTION) ×2 IMPLANT
PACK SHOULDER (CUSTOM PROCEDURE TRAY) ×2 IMPLANT
PAD ARMBOARD 7.5X6 YLW CONV (MISCELLANEOUS) ×4 IMPLANT
SET ARTHROSCOPY TUBING (MISCELLANEOUS) ×1
SET ARTHROSCOPY TUBING LN (MISCELLANEOUS) ×1 IMPLANT
SET JUGGERKNOT DISP 1.4MM ×2 IMPLANT
SLING ARM IMMOBILIZER LRG (SOFTGOODS) ×2 IMPLANT
SLING ARM IMMOBILIZER MED (SOFTGOODS) IMPLANT
SPONGE GAUZE 4X4 12PLY STER LF (GAUZE/BANDAGES/DRESSINGS) ×2 IMPLANT
SPONGE LAP 4X18 X RAY DECT (DISPOSABLE) IMPLANT
STRIP CLOSURE SKIN 1/2X4 (GAUZE/BANDAGES/DRESSINGS) ×2 IMPLANT
SUT LASSO 45 DEG R (SUTURE) ×2 IMPLANT
SUT MNCRL AB 3-0 PS2 18 (SUTURE) ×2 IMPLANT
SUT PDS AB 1 CT  36 (SUTURE) ×2
SUT PDS AB 1 CT 36 (SUTURE) ×2 IMPLANT
SYR 20CC LL (SYRINGE) IMPLANT
TAPE PAPER 3X10 WHT MICROPORE (GAUZE/BANDAGES/DRESSINGS) ×2 IMPLANT
TOWEL OR 17X24 6PK STRL BLUE (TOWEL DISPOSABLE) ×2 IMPLANT
TOWEL OR 17X26 10 PK STRL BLUE (TOWEL DISPOSABLE) ×2 IMPLANT
WATER STERILE IRR 1000ML POUR (IV SOLUTION) ×2 IMPLANT

## 2016-05-28 NOTE — Transfer of Care (Signed)
Immediate Anesthesia Transfer of Care Note  Patient: Erik White  Procedure(s) Performed: Procedure(s): LEFT SHOULDER ARTHROSCOPY WITH ANTERIOR AND POSTERIOR CAPSULOLABRAL RECONSTRUCTION (Left)  Patient Location: PACU  Anesthesia Type:General  Level of Consciousness: awake, alert , oriented and patient cooperative  Airway & Oxygen Therapy: Spont breathing. [place] 2l  Post-op Assessment: Report given to RN and Post -op Vital signs reviewed and stable  Post vital signs: Reviewed and stable  Last Vitals:  Vitals:   05/28/16 1310 05/28/16 1315  BP: 137/82 (!) 150/58  Pulse: 83 66  Resp: 16 12  Temp:      Last Pain:  Vitals:   05/28/16 1058  TempSrc: Oral      Patients Stated Pain Goal: 1 ([id number])  Complications: No apparent anesthesia complications

## 2016-05-28 NOTE — Op Note (Signed)
NAME:  Erik White, Erik White NO.:  [id number]  MEDICAL RECORD NO.:  [id number]  LOCATION:  [facility]                         FACILITY:  [facility]  PHYSICIAN:  Metta Clines. Domenick Quebedeaux, M.D.  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  PROCEDURES: 1. Left shoulder examination under anesthesia. 2. Left shoulder glenohumeral joint diagnostic arthroscopy. 3. Anterior capsulolabral reconstruction. 4. Posterior capsulolabral reconstruction.  SURGEON:  Metta Clines. Larie Mathes, M.D.  Terrence DupontOlivia Mackie A. Shuford, P.A.-C.  ANESTHESIA:  General endotracheal as well as an interscalene block.  ESTIMATED BLOOD LOSS:  Minimal.  DRAINS:  None.  HISTORY:  Erik White is an 20 year old male, who has had a long history of left shoulder pain with instability and symptoms that have become progressively more severe to the point, it was now having symptomatic instability with daily activities.  His examination clinically suggest primarily posterior instability with an MRI scan showed a posterior and inferior labral tear.  Due to his ongoing pain and increasing functional limitations, he is brought to the operating room at this time for planned left shoulder arthroscopic stabilization as described below.  Preoperatively, I counseled Erik White and his parents regarding treatment options, potential risks versus benefits thereof.  Possible surgical complications were reviewed including bleeding, infection, neurovascular injury, persistent pain, loss of motion, anesthetic complication, recurrence of instability and possible need for additional surgery.  He understands and accepts and agrees with our planned  procedure.  PROCEDURE IN DETAIL:  After undergoing routine preop evaluation, the patient received prophylactic antibiotics, and an interscalene block was established in the holding area by the Anesthesia Department.  Placed supine on the operating table, underwent smooth induction of a general endotracheal anesthesia.  Turned to the right lateral decubitus position on a beanbag and appropriately padded and protected.  Left shoulder examination under anesthesia confirmed what appeared to be multidirectional instability with sterile and inferior element as well as full anterior-posterior elements to the instability pattern.  Left arm was then suspended at 70 degrees of abduction with 15 pounds of traction.  Left shoulder girdle region was sterilely prepped and draped in standard fashion.  Time-out was called.  Posterior portal was established in the glenohumeral joint, anterior portal was established under direct visualization.  The capsular volume was significantly enlarged inferiorly and posteriorly.  There was a complex and extensive posteroinferior labral tear from 3 o'clock down past the 6 o'clock position with some labral separation noted anteroinferiorly.  There was large pedunculated fragment of torn labral tissue at the 5 o'clock position, which we debrided.  On stability examination, we could see that the humeral head translated perhaps 60% anteriorly and 70% posteriorly and given the significant inferior laxity, we elected to proceed with both an anterior and a posterior capsulolabral reconstruction.  We began anteriorly where I elevated the anterior- inferior quadrant of the glenoid, the attached glenohumeral ligaments from the approximately 9 o'clock position down to 6 o'clock.  Confirmed that  this was probably mobilized.  We used a ball rasp to abrade the margin of the glenoid to bony bed, used a shaver to remove soft tissues and then placed to assess our anterior-inferior  portal that we established at the upper margin of the subscapularis, two JuggerKnot suture anchors at the 7 and 8 o'clock positions.  The suture limbs were then shuttled through the anterior band of the inferior glenohumeral ligament and adjacent labrum in a horizontal mattress pattern allowing [place] to recreate prominent anterior-inferior capsulolabral "bumper" and reduce the redundancy of the anterior-inferior capsular tissues.  These were all tied using standard sliding locking knots followed by multiple overhand throws and alternating posts.  Very pleased with the overall construct and the nice reduction of the redundant capsular volume and helping to center the humeral head over the glenoid.  At this point, we then turned our attention posteriorly where I elevated the labral tear, mobilized this and again this was mobilized from the 2:30 down to the 6 o'clock position.  Used a shaver to debride the soft tissue and then used a ball rasp to abrade the margin of the glenoid from 3 o'clock down to 6 o'clock position.  Next step was accessory posterior-inferior portal and this allowed [place] to then place a series of 3 JuggerKnot anchors equidistant across the width of the arc of the posterior- inferior quadrant of the glenoid rim.  The suture limbs were then shuttled through the capsular pouch and posterior-inferior capsulolabral tissues, again creating an appropriate proper bumper and eliminate the redundant capsular volume and the axillary pouch in the posterior aspect of the shoulder joint.  Again, all these were passed in a horizontal mattress pattern and then tied with sliding locking knots followed by multiple overhand throws and alternating posts.  The overall construct was much to our satisfaction.  Suture limbs were all appropriately clipped.  We confirmed the humeral head was then centered nicely over the glenoid with elimination of the capsular laxity in the axillary pouch.  Final  inspection of the joint showed no additional pathologies for the rotator cuff.  Biceps and superior labrum all intact.  Fluid and instruments were then removed.  The arm was then placed in a sling immobilizer.  The patient was then awakened, extubated and taken to the recovery room in stable condition.  Jenetta Loges, PA-C was used as an Environmental consultant throughout this case, was essential for help with positioning the patient, positioning the extremity, management of the arthroscopic equipment, tissue manipulation, suture management, wound closure and intraoperative decision making.     Metta Clines. Arali Somera, M.D.     KMS/MEDQ  D:  05/28/2016  T:  05/28/2016  Job:  [id number]

## 2016-05-28 NOTE — Anesthesia Procedure Notes (Signed)
Procedure Name: Intubation Date/Time: 05/28/2016 1:44 PM Performed by: Suzy Bouchard Pre-anesthesia Checklist: Patient identified, Emergency Drugs available, Suction available, Patient being monitored and Timeout performed Patient Re-evaluated:Patient Re-evaluated prior to inductionOxygen Delivery Method: Circle system utilized Preoxygenation: Pre-oxygenation with 100% oxygen Intubation Type: IV induction Ventilation: Mask ventilation without difficulty Laryngoscope Size: Miller and 2 Grade View: Grade I Tube type: Oral Tube size: 7.5 mm Number of attempts: 1 Airway Equipment and Method: Stylet and LTA kit utilized Placement Confirmation: ETT inserted through vocal cords under direct vision,  positive ETCO2 and breath sounds checked- equal and bilateral Secured at: 23 cm Tube secured with: Tape Dental Injury: Teeth and Oropharynx as per pre-operative assessment

## 2016-05-28 NOTE — Anesthesia Procedure Notes (Signed)
Anesthesia Regional Block:  Interscalene brachial plexus block  Pre-Anesthetic Checklist: ,, timeout performed, Correct Patient, Correct Site, Correct Laterality, Correct Procedure, Correct Position, site marked, Risks and benefits discussed, Surgical consent,  Pre-op evaluation,  Post-op pain management  Laterality: Left  Prep: chloraprep       Needles:  Injection technique: Single-shot  Needle Type: Stimulator Needle - 40     Needle Length: 4cm 4 cm Needle Gauge: 22 and 22 G    Additional Needles:  Procedures: nerve stimulator Interscalene brachial plexus block Narrative:  Injection made incrementally with aspirations every 5 mL. Anesthesiologist: Lawan Nanez  Additional Notes: BP cuff, EKG monitors applied. Sedation begun. Nerve location verified with U/S. Anesthetic injected incrementally, slowly , and after neg aspirations under direct u/s guidance. Good perineural spread. Tolerated well.      

## 2016-05-28 NOTE — Op Note (Signed)
05/28/2016  3:41 PM  PATIENT:   [name]  20 y.o. male  PRE-OPERATIVE DIAGNOSIS:  left shoulder multi directional instability  POST-OPERATIVE DIAGNOSIS:  same  PROCEDURE:  LSA, anterior and posterior capsulolabral reconstruction  SURGEON:  Margia Wiesen, Metta Clines M.D.  ASSISTANTS: Shuford pac   ANESTHESIA:   GET + ISB  EBL: min  SPECIMEN:  none  Drains: none   PATIENT DISPOSITION:  PACU - hemodynamically stable.    PLAN OF CARE: Discharge to home after PACU  Dictation# [id number]   Contact # [phone]

## 2016-05-28 NOTE — H&P (Signed)
[name]    Chief Complaint: left shoulder multi directional instability HPI: The patient is a 20 y.o. male with chronic left shoulder instability and increasing pain and functional limitations  Past Medical History:  Diagnosis Date  . GERD (gastroesophageal reflux disease)   . Headache    migraines  . Mesenteric adenitis 2005    Past Surgical History:  Procedure Laterality Date  . WISDOM TOOTH EXTRACTION      Family History  Problem Relation Age of Onset  . Heart attack Father   . Cancer Other     lung  . Cancer Other     liver CA  . Thyroid disease Mother   . Hypertension Mother     Social History:  reports that he has never smoked. He has never used smokeless tobacco. He reports that he does not drink alcohol or use drugs.   Medications Prior to Admission  Medication Sig Dispense Refill  . ibuprofen (ADVIL,MOTRIN) 200 MG tablet Take 200 mg by mouth every 6 (six) hours as needed for moderate pain.     Marland Kitchen diclofenac sodium (VOLTAREN) 1 % GEL Apply 4 g topically 4 (four) times daily. To affected joint. (Patient not taking: Reported on 05/25/2016) 100 g 11     Physical Exam: left shoulder with examination consistent with posterior instability and likely MDI  Vitals  Temp:  [98.2 F (36.8 C)] 98.2 F (36.8 C) (08/24 1058) Pulse Rate:  [58-72] 58 (08/24 1230) Resp:  [16-18] 16 (08/24 1230) BP: (135)/(71) 135/71 (08/24 1058) SpO2:  [100 %] 100 % (08/24 1230) Weight:  [99.3 kg (218 lb 14.7 oz)] 99.3 kg (218 lb 14.7 oz) (08/24 1058)  Assessment/Plan  Impression: left shoulder multi directional instability  Plan of Action: Procedure(s): LEFT SHOULDER ARTHROSCOPY WITH ANTERIOR AND POSTERIOR LABRAL REPAIR VS CAPSULOLABRAL reconstruction  Jackelin Correia M Naeema Patlan 05/28/2016, 12:30 PM Contact # [phone]

## 2016-05-28 NOTE — Discharge Instructions (Signed)
° °  Kevin M. Supple, M.D., F.A.A.O.S. °Orthopaedic Surgery °Specializing in Arthroscopic and Reconstructive °Surgery of the Shoulder and Knee °[phone] °[address] - [place], [place] [address] - Fax [phone] ° °POST-OP SHOULDER ARTHROSCOPIC  LABRAL REPAIR INSTRUCTIONS ° °1. Call the office at [phone] to schedule your first post-op appointment 7-10 days from the date of your surgery. ° °2. Leave the steri-strips in place over your incisions when performing dressing changes and showering. You may remove your dressings and begin showering 72 hours from surgery. You can expect drainage that is clear to bloody in nature that occasionally will soak through your dressings. If this occurs go ahead and perform a dressing change. The drainage should lessen daily and when there is no drainage from your incisions feel free to go without a dressing. ° °3. Wear your sling/immobilizer at all times except to perform the exercises below or to occasionally let your arm dangle by your side to stretch your elbow. You also need to sleep in your sling immobilizer until instructed otherwise. ° °4. Range of motion to your elbow, wrist, and hand are encouraged 3-5 times daily. Exercise to your hand and fingers helps to reduce swelling you may experience. ° °5. Utilize ice to the shoulder 3-4 times minimum a day and additionally if you are experiencing pain. ° °6. You may one-armed drive when safely off of narcotics and muscle relaxants. You may use your hand that is in the sling to support the steering wheel only. However, should it be your right arm that is in the sling it is not to be used for gear shifting in a manual transmission. ° °7. If you had a block pre-operatively to provide post-op pain relief you may want to go ahead and begin utilizing your pain meds as your arm begins to wake up. Blocks can sometimes last up to 16-18 hours. If you are still pain-free prior to going to bed you may want to strongly  consider taking a pain medication to avoid being awakened in the night with the onset of pain. A muscle relaxant is also provided for you should you experience muscle spasms. It is recommended that if you are experiencing pain that your pain medication alone is not controlling, add the muscle relaxant along with the pain medication which can give additional pain relief. The first one to two days is generally the most severe of your pain and then should gradually decrease. As your pain lessens it is recommended that you decrease your use of the pain medications to an "as needed basis" only and to always comply with the recommended dosages of the pain medications. ° °8. Pain medications can produce constipation along with their use. If you experience this, the use of an over the counter stool softener or laxative daily is recommended.  ° °9. For additional questions or concerns, please do not hesitate to call the office. If after hours there is an answering service to forward your concerns to the physician on call. ° °POST-OP EXERCISES ° °Pendulum Exercises ° °Perform pendulum exercises while standing and bending at the waist. Support your uninvolved arm on a table or chair and allow your operated arm to hang freely. Make sure to do these exercises passively - not using you shoulder muscle. ° °Repeat 20 times. Do 3 sessions per day. ° ° °

## 2016-05-28 NOTE — Anesthesia Postprocedure Evaluation (Signed)
Anesthesia Post Note  Patient: Erik White  Procedure(s) Performed: Procedure(s) (LRB): LEFT SHOULDER ARTHROSCOPY WITH ANTERIOR AND POSTERIOR CAPSULOLABRAL RECONSTRUCTION (Left)  Patient location during evaluation: PACU Anesthesia Type: General and Regional Level of consciousness: awake, awake and alert and oriented Pain management: pain level controlled Vital Signs Assessment: post-procedure vital signs reviewed and stable Respiratory status: spontaneous breathing, nonlabored ventilation and respiratory function stable Cardiovascular status: blood pressure returned to baseline Postop Assessment: no headache Anesthetic complications: no    Last Vitals:  Vitals:   05/28/16 1543 05/28/16 1630  BP: 117/71   Pulse: 88   Resp: 20   Temp: 36.4 C 36.6 C    Last Pain:  Vitals:   05/28/16 1645  TempSrc:   PainSc: 0-No pain                 Dash Cardarelli COKER

## 2016-05-28 NOTE — Anesthesia Preprocedure Evaluation (Addendum)
Anesthesia Evaluation  Patient identified by MRN, date of birth, ID band Patient awake    Reviewed: Allergy & Precautions, NPO status , Patient's Chart, lab work & pertinent test results  Airway Mallampati: II  TM Distance: >3 FB Neck ROM: Full    Dental no notable dental hx. (+) Teeth Intact, Dental Advisory Given   Pulmonary neg pulmonary ROS,    Pulmonary exam normal breath sounds clear to auscultation       Cardiovascular negative cardio ROS Normal cardiovascular exam Rhythm:Regular Rate:Normal     Neuro/Psych negative neurological ROS  negative psych ROS   GI/Hepatic Neg liver ROS, GERD  ,  Endo/Other  negative endocrine ROS  Renal/GU negative Renal ROS     Musculoskeletal negative musculoskeletal ROS (+)   Abdominal   Peds  Hematology negative hematology ROS (+)   Anesthesia Other Findings   Reproductive/Obstetrics negative OB ROS                            Anesthesia Physical Anesthesia Plan  ASA: I  Anesthesia Plan: General and Regional   Post-op Pain Management: GA combined w/ Regional for post-op pain   Induction: Intravenous  Airway Management Planned: Oral ETT  Additional Equipment:   Intra-op Plan:   Post-operative Plan: Extubation in OR  Informed Consent: I have reviewed the patients History and Physical, chart, labs and discussed the procedure including the risks, benefits and alternatives for the proposed anesthesia with the patient or authorized representative who has indicated his/her understanding and acceptance.   Dental advisory given  Plan Discussed with: CRNA  Anesthesia Plan Comments:         Anesthesia Quick Evaluation

## 2016-05-29 ENCOUNTER — Encounter (HOSPITAL_COMMUNITY): Payer: Self-pay | Admitting: Orthopedic Surgery

## 2016-06-01 ENCOUNTER — Other Ambulatory Visit (INDEPENDENT_AMBULATORY_CARE_PROVIDER_SITE_OTHER): Payer: 59

## 2016-06-01 ENCOUNTER — Ambulatory Visit (INDEPENDENT_AMBULATORY_CARE_PROVIDER_SITE_OTHER): Payer: 59 | Admitting: Endocrinology

## 2016-06-01 ENCOUNTER — Encounter: Payer: Self-pay | Admitting: Endocrinology

## 2016-06-01 ENCOUNTER — Telehealth: Payer: Self-pay | Admitting: Endocrinology

## 2016-06-01 VITALS — BP 118/72 | HR 68 | Ht 73.0 in | Wt 212.0 lb

## 2016-06-01 DIAGNOSIS — E291 Testicular hypofunction: Secondary | ICD-10-CM

## 2016-06-01 DIAGNOSIS — N63 Unspecified lump in unspecified breast: Secondary | ICD-10-CM

## 2016-06-01 DIAGNOSIS — R946 Abnormal results of thyroid function studies: Secondary | ICD-10-CM

## 2016-06-01 DIAGNOSIS — R7989 Other specified abnormal findings of blood chemistry: Secondary | ICD-10-CM

## 2016-06-01 LAB — IBC PANEL
Iron: 96 ug/dL (ref 42–165)
Saturation Ratios: 23 % (ref 20.0–50.0)
Transferrin: 298 mg/dL (ref 212.0–360.0)

## 2016-06-01 LAB — T4, FREE: Free T4: 0.88 ng/dL (ref 0.60–1.60)

## 2016-06-01 LAB — TSH: TSH: 2.94 u[IU]/mL (ref 0.40–5.00)

## 2016-06-01 NOTE — Progress Notes (Addendum)
Subjective:    Patient ID: Erik White, male    DOB: Jan 19, 1996, 19 y.o.   MRN: [id number]  HPI Pt is referred by Dr Erik White, for gynecomastia.  He reports he had puberty at the normal age.  He has no biological children.  He says he has never taken illicit androgens.  He has never been on any prescribed or non-prescribed medication for hypogonadism.  He does not take antiandrogens or opioids.  He denies any h/o infertility, XRT, or genital infection.  He has never had surgery, or a serious injury to the head or genital area.  He does not consume alcohol.  He has 1-2 years of slight swelling at the left breast area, but no assoc pain.   Past Medical History:  Diagnosis Date  . GERD (gastroesophageal reflux disease)   . Headache    migraines  . Mesenteric adenitis 2005    Past Surgical History:  Procedure Laterality Date  . SHOULDER ARTHROSCOPY WITH LABRAL REPAIR Left 05/28/2016   Procedure: LEFT SHOULDER ARTHROSCOPY WITH ANTERIOR AND POSTERIOR CAPSULOLABRAL RECONSTRUCTION;  Surgeon: Justice Britain, MD;  Location: [facility];  Service: Orthopedics;  Laterality: Left;  . WISDOM TOOTH EXTRACTION      Social History   Social History  . Marital status: Single    Spouse name: N/A  . Number of children: N/A  . Years of education: N/A   Occupational History  . Not on file.   Social History Main Topics  . Smoking status: Never Smoker  . Smokeless tobacco: Never Used  . Alcohol use No  . Drug use: No  . Sexual activity: Not on file   Other Topics Concern  . Not on file   Social History Narrative  . No narrative on file    Current Outpatient Prescriptions on File Prior to Visit  Medication Sig Dispense Refill  . diazepam (VALIUM) 5 MG tablet Take 0.5-1 tablets (2.5-5 mg total) by mouth every 6 (six) hours as needed for muscle spasms or sedation. 40 tablet 1  . naproxen (NAPROSYN) 500 MG tablet Take 1 tablet (500 mg total) by mouth 2 (two) times daily with a meal. 60 tablet 1  .  oxyCODONE-acetaminophen (PERCOCET) 5-325 MG tablet Take 1-2 tablets by mouth every 4 (four) hours as needed. (Patient taking differently: Take 1-2 tablets by mouth every 6 (six) hours as needed. ) 40 tablet 0  . ondansetron (ZOFRAN) 4 MG tablet Take 1 tablet (4 mg total) by mouth every 8 (eight) hours as needed for nausea or vomiting. (Patient not taking: Reported on 06/01/2016) 20 tablet 0  . [DISCONTINUED] esomeprazole (NEXIUM) 40 MG packet Take 40 mg by mouth daily before breakfast.    . [DISCONTINUED] promethazine (PHENERGAN) 25 MG tablet Take 1 tablet (25 mg total) by mouth every 6 (six) hours as needed for nausea or vomiting. 12 tablet 0   No current facility-administered medications on file prior to visit.     Allergies  Allergen Reactions  . No Known Allergies     Family History  Problem Relation Age of Onset  . Heart attack Father   . Cancer Other     lung  . Cancer Other     liver CA  . Thyroid disease Mother   . Hypertension Mother     BP 118/72   Pulse 68   Ht 6\' 1"  (1.854 m)   Wt 212 lb (96.2 kg)   BMI 27.97 kg/m    Review of Systems denies depression,  numbness, erectile dysfunction, decreased urinary stream, muscle weakness, fever, headache, easy bruising, sob, rash, blurry vision, rhinorrhea, chest pain.  He has lost 30 lbs, over the past few months, due to his efforts.     Objective:   Physical Exam VS: see vs page GEN: no distress HEAD: head: no deformity eyes: no periorbital swelling, no proptosis external nose and ears are normal mouth: no lesion seen NECK: supple, thyroid is not enlarged CHEST WALL: no deformity LUNGS: clear to auscultation BREASTS:  Slight left gynecomastia.  CV: reg rate and rhythm, no murmur ABD: abdomen is soft, nontender.  no hepatosplenomegaly.  not distended.  no hernia GENITALIA:  Normal male.   MUSCULOSKELETAL: muscle bulk and strength are grossly normal.  no obvious joint swelling.  gait is normal and steady.  Left  shoulder is in a sling.   EXTEMITIES: no deformity.  no ulcer on the feet.  feet are of normal color and temp.  no edema.   PULSES: dorsalis pedis intact bilat.  no carotid bruit NEURO:  cn 2-12 grossly intact.   readily moves all 4's.  sensation is intact to touch on the feet.  Normal hair distribution.   SKIN:  Normal texture and temperature.  No rash or suspicious lesion is visible.   NODES:  None palpable at the neck.  PSYCH: alert, well-oriented.  Does not appear anxious nor depressed.     Lab Results  Component Value Date   TESTOSTERONE 379 06/01/2016   Lab Results  Component Value Date   TSH 2.94 06/01/2016       Assessment & Plan:  Mildly elevated TSH, normal on recheck.  However, this places him at high risk for overt hypothyroidism.  Low testosterone, normal on recheck.  Gynecomastia, new, prob age-related benign. Weight loss: this is good rx for the gynecomastia and low testosterone.  He is advised to continue his efforts.

## 2016-06-01 NOTE — Telephone Encounter (Signed)
please call I have added prolactin.  Can they do with the blood they have?

## 2016-06-01 NOTE — Telephone Encounter (Signed)
Lab contacted about adding the prolactin order. Waiting on confirmation from the lab if the order can be added.

## 2016-06-01 NOTE — Patient Instructions (Addendum)
Let's check a mammogram.  you will receive a phone call, about a day and time for an appointment. Also, blood tests are requested for you today.  We'll let you know about the results.

## 2016-06-02 ENCOUNTER — Other Ambulatory Visit: Payer: 59

## 2016-06-02 DIAGNOSIS — E291 Testicular hypofunction: Secondary | ICD-10-CM | POA: Diagnosis not present

## 2016-06-02 DIAGNOSIS — R7989 Other specified abnormal findings of blood chemistry: Secondary | ICD-10-CM

## 2016-06-02 LAB — TESTOSTERONE, FREE, TOTAL, SHBG
SEX HORMONE BINDING: 19.6 nmol/L (ref 16.5–55.9)
TESTOSTERONE FREE: 11.3 pg/mL
Testosterone: 379 ng/dL

## 2016-06-03 LAB — PROLACTIN: Prolactin: 4.3 ng/mL (ref 2.0–18.0)

## 2016-06-08 DIAGNOSIS — M25311 Other instability, right shoulder: Secondary | ICD-10-CM | POA: Diagnosis not present

## 2016-06-08 DIAGNOSIS — Z808 Family history of malignant neoplasm of other organs or systems: Secondary | ICD-10-CM | POA: Diagnosis not present

## 2016-06-08 DIAGNOSIS — R600 Localized edema: Secondary | ICD-10-CM | POA: Diagnosis not present

## 2016-06-08 DIAGNOSIS — S43431D Superior glenoid labrum lesion of right shoulder, subsequent encounter: Secondary | ICD-10-CM | POA: Diagnosis not present

## 2016-06-12 ENCOUNTER — Telehealth: Payer: Self-pay

## 2016-06-12 DIAGNOSIS — N63 Unspecified lump in unspecified breast: Secondary | ICD-10-CM

## 2016-06-12 NOTE — Telephone Encounter (Signed)
done

## 2016-06-12 NOTE — Telephone Encounter (Signed)
C[facility] with the breast center called and requested the mammogram order to be changed to a left chest ultrasound limited. Number [id number].

## 2016-06-19 ENCOUNTER — Other Ambulatory Visit: Payer: Self-pay | Admitting: Endocrinology

## 2016-06-19 ENCOUNTER — Ambulatory Visit
Admission: RE | Admit: 2016-06-19 | Discharge: 2016-06-19 | Disposition: A | Discharge status: Home / Self Care | Payer: 59 | Source: Ambulatory Visit | Attending: Endocrinology | Admitting: Endocrinology

## 2016-06-19 ENCOUNTER — Ambulatory Visit (INDEPENDENT_AMBULATORY_CARE_PROVIDER_SITE_OTHER): Payer: 59 | Admitting: Physical Therapy

## 2016-06-19 ENCOUNTER — Encounter: Payer: Self-pay | Admitting: Physical Therapy

## 2016-06-19 DIAGNOSIS — N63 Unspecified lump in unspecified breast: Secondary | ICD-10-CM

## 2016-06-19 DIAGNOSIS — M25612 Stiffness of left shoulder, not elsewhere classified: Secondary | ICD-10-CM

## 2016-06-19 DIAGNOSIS — M25511 Pain in right shoulder: Secondary | ICD-10-CM | POA: Diagnosis not present

## 2016-06-19 DIAGNOSIS — M6281 Muscle weakness (generalized): Secondary | ICD-10-CM | POA: Diagnosis not present

## 2016-06-19 NOTE — Therapy (Signed)
[place], [place], [address] Phone: [phone]   Fax:  [phone]  Physical Therapy Evaluation  Patient Details  Name: Erik White MRN: [id number] Date of Birth: Nov 14, 1995 Referring Provider: Dr Ander Slade  Encounter Date: 06/19/2016      PT End of Session - 06/19/16 0941    Visit Number 1   Number of Visits 16   Date for PT Re-Evaluation 08/14/16   PT Start Time [id number]   PT Stop Time 0939   PT Time Calculation (min) 62 min   Activity Tolerance Patient tolerated treatment well      Past Medical History:  Diagnosis Date  . GERD (gastroesophageal reflux disease)   . Headache    migraines  . Mesenteric adenitis 2005    Past Surgical History:  Procedure Laterality Date  . SHOULDER ARTHROSCOPY WITH LABRAL REPAIR Left 05/28/2016   Procedure: LEFT SHOULDER ARTHROSCOPY WITH ANTERIOR AND POSTERIOR CAPSULOLABRAL RECONSTRUCTION;  Surgeon: Justice Britain, MD;  Location: [place];  Service: Orthopedics;  Laterality: Left;  . WISDOM TOOTH EXTRACTION      There were no vitals filed for this visit.       Subjective Assessment - 06/19/16 0843    Subjective Pt reports he had a h/o Lt shoulder instability, he had elective repair 3 wks ago for a SLAP repair not involving the bicep tendon.  He is wearing a sling, performing pendulums and isometric ER/IR. Plays basketball.  He reports issues with the Rt shoulder however not as bad.    Pertinent History investigating breast mass on Lt side.    Diagnostic tests MRI   Patient Stated Goals get arm use back, return to basketball.    Currently in Pain? No/denies  has some pain with moving the Lt UE and sleeping.             [facility] PT Assessment - 06/19/16 0001      Assessment   Medical Diagnosis Lt shoulder SLAP repair   Referring Provider Dr Ander Slade   Onset Date/Surgical Date 05/28/16   Hand Dominance Right   Next MD Visit not sure   Prior Therapy  none     Precautions   Precautions Shoulder   Type of Shoulder Precautions SLAP repair   Shoulder Interventions Shoulder sling/immobilizer   Precaution Comments Lt breast mass being assessed     Balance Screen   Has the patient fallen in the past 6 months No   Has the patient had a decrease in activity level because of a fear of falling?  No   Is the patient reluctant to leave their home because of a fear of falling?  No     Home Ec[employer] residence     Prior Function   Level of Independence Independent   Vocation Student   Vocation Requirements took semester off for shoulder surgery   Leisure basketball, soccer     Observation/Other Assessments   Focus on Therapeutic Outcomes (FOTO)  70% limited     Observation/Other Assessments-Edema    Edema --  (+) in Lt shoulder     ROM / Strength   AROM / PROM / Strength AROM;PROM;Strength     AROM   AROM Assessment Site Shoulder;Cervical;Elbow;Wrist   Right/Left Shoulder Left  Rt WNL   Left Shoulder Flexion 90 Degrees   Left Shoulder ABduction 90 Degrees  some discomfort   Left Shoulder Internal Rotation 92 Degrees   Left  Shoulder External Rotation -12 Degrees   Right/Left Elbow --  bilat WNL   Right/Left Wrist --  bilat WNl   Cervical Flexion WNL   Cervical Extension WNL   Cervical - Right Side Bend WNL   Cervical - Left Side Bend WNL   Cervical - Right Rotation WNL   Cervical - Left Rotation WNL     PROM   PROM Assessment Site Shoulder   Right/Left Shoulder Left   Left Shoulder Flexion 150 Degrees   Left Shoulder ABduction 50 Degrees   Left Shoulder External Rotation 10 Degrees     Strength   Strength Assessment Site Shoulder;Elbow;Wrist   Right/Left Shoulder --  Rt WNl, Lt NA due to surgery    Right/Left Elbow --  bilat WNL   Right/Left Wrist --  bilat WNL     Palpation   Palpation comment tightness in LT deltoid                   [facility] Adult PT  Treatment/Exercise - 06/19/16 0001      Exercises   Exercises Shoulder     Shoulder Exercises: Supine   External Rotation AAROM;Left;10 reps  with wand   Flexion AAROM;Left;10 reps  to 90 degrees, with wand   ABduction AAROM;Left;10 reps  with wand, to 90 degrees     Shoulder Exercises: Seated   External Rotation PROM;10 reps   Flexion PROM;10 reps     Shoulder Exercises: Standing   Extension AAROM;Left;10 reps  with wand   Other Standing Exercises scapular retraction x 10 5 sec holds   Other Standing Exercises Lt hand grip with ball reveiwed     Shoulder Exercises: ROM/Strengthening   Pendulum reviewed     Shoulder Exercises: Isometric Strengthening   External Rotation 5X5"   Internal Rotation 5X5"     Modalities   Modalities Vasopneumatic     Vasopneumatic   Number Minutes Vasopneumatic  15 minutes   Vasopnuematic Location  Shoulder  Lt   Vasopneumatic Pressure Low   Vasopneumatic Temperature  3*                PT Education - 06/19/16 0937    Education provided Yes   Education Details HEP    Person(s) Educated Patient   Methods Explanation;Demonstration;Handout   Comprehension Returned demonstration;Verbalized understanding          PT Short Term Goals - 06/19/16 1231      PT SHORT TERM GOAL #1   Title I with initial HEP ( 07/16/16)    Time 4   Period Weeks   Status New     PT SHORT TERM GOAL #2   Title demo Lt shoulder PROM WFL ( 07/16/16)    Time 4   Period Weeks   Status New     PT SHORT TERM GOAL #3   Title tolerate initial resistance ex without pain flare up ( 07/16/16)    Time 4   Period Weeks   Status New     PT SHORT TERM GOAL #4   Title FOTO =/< 50% limited ( 07/16/16)   Time 4   Period Weeks   Status New           PT Long Term Goals - 06/19/16 1232      PT LONG TERM GOAL #1   Title I with advanced HEP ( 08/14/16)    Time 8   Period Weeks   Status New     PT LONG  TERM GOAL #2   Title demo full Lt shoulder  ROM ( 08/14/16)    Time 8   Period Weeks   Status New     PT LONG TERM GOAL #3   Title demo Lt shoulder strength =/> 4+/5 ( 08/14/16)    Time 8   Period Weeks   Status New     PT LONG TERM GOAL #4   Title tolerate light tossing of basketball ( 08/14/16)    Time 8   Period Weeks   Status New     PT LONG TERM GOAL #5   Title improve FOTO =/< 22% limited ( 08/14/16)    Time 8   Period Weeks   Status New               Plan - 06/19/16 [id number]    Clinical Impression Statement 20 yo male presents 3 weeks s/p Lt SLAP repair, he is on track per protocol with ROM.  He has limited ROM, strength and some pain as would be expected after surgery expected.  Mom thought this was to a one time appointment until after he sees the surgeon. Suggested she call the office to verify.  Recommend he continue with PT per protocol.    Rehab Potential Good   PT Frequency 2x / week   PT Duration 8 weeks   PT Treatment/Interventions Moist Heat;Therapeutic exercise;Dry needling;Taping;Vasopneumatic Device;Cryotherapy;Iontophoresis 4mg /ml Dexamethasone;Patient/family education;Passive range of motion  will not perform ultrasound or e-stim until results from Lt breast mass are determined   PT Next Visit Plan Mom will call once she talks to MD, progress per protocol, 4 wks 06/25/16   Consulted and Agree with Plan of Care Patient;Family member/caregiver   Family Member Consulted mother      Patient will benefit from skilled therapeutic intervention in order to improve the following deficits and impairments:  Decreased strength, Postural dysfunction, Pain, Impaired UE functional use, Decreased range of motion  Visit Diagnosis: Stiffness of left shoulder, not elsewhere classified - Plan: PT plan of care cert/re-cert  Muscle weakness (generalized) - Plan: PT plan of care cert/re-cert  Pain in right shoulder - Plan: PT plan of care cert/re-cert     Problem List Patient Active Problem List   Diagnosis  Date Noted  . SLAP lesion of shoulder 05/01/2016  . Elevated TSH 04/28/2016  . 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 Breast mass in male 04/20/2016  . 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 Patellar tendonitis of left knee 04/20/2016    Jeral Pinch PT 06/19/2016, 12:44 PM  [facility] [place] [employer] [place], [place], [address] Phone: [phone]   Fax:  [phone]  Name: SHINICHI DEGUIA MRN: [id number] Date of Birth: 1995/10/22

## 2016-06-19 NOTE — Patient Instructions (Addendum)
ROM: Flexion - Wand (Supine) - DO NOT GO PAST 90 DEGREES UNTIL 06/25/16 - perform press ups then flexion.    Lie on back holding wand. Raise arms over head.  Repeat __10__ times per set. Do __1__ sets per session. Do _2___ sessions per day.  ROM: External / Internal Rotation - Wand    Holding wand with left hand palm up, push out from body with other hand, palm down. Keep both elbows bent. When stretch is felt, hold ____ seconds. Repeat to other side, leading with same hand. Keep elbows bent. Repeat _10___ times per set. Do __1__ sets per session. Do __2__ sessions per day.  ROM: Horizontal Abduction / Adduction - Wand    Keeping both palms down, push right hand across body with other hand. Then pull back across body, keeping arms parallel to floor. Do not allow trunk to twist. Hold __1__ seconds. Repeat _10___ times per set. Do _1___ sets per session. Do __2__ sessions per day.  ROM: Extension - Wand (Standing)    Stand holding wand behind back. Raise arms as far as possible. Repeat _10___ times per set. Do __1__ sets per session. Do __2__ sessions per day.   Strengthening: Isometric Internal Rotation    Keeping right elbow at side, use other hand at wrist to apply light resistance to inward motion. Hold __5__ seconds. Repeat _10___ times per set. Do __1__ sets per session. Do _1-2___ sessions per day. Submax contraction Strengthening: Isometric External Rotation    Keeping left elbow at side, use other hand at wrist to apply light resistance to outward motion. Hold __5__ seconds. Repeat _10___ times per set. Do __1__ sets per session. Do __1-2__ sessions per day. Sub max contraction.  ROM: Pendulum (Circular)    Let right arm move in circle clockwise, then counterclockwise, by rocking body weight in circular pattern. Circle __5-10__ times each direction per set. Do __1__ sets per session. Do _as needed___ sessions per day.  ROM: Flexion    Keeping left arm on table,  slide body away until stretch is felt. Hold _5-10___ seconds. Repeat __3-5__ times per set. Do ___1_ sets per session. Do __2__ sessions per day.  ROM: External Rotation    Keeping left forearm palm down on table, bend forward at waist until stretch is felt. Hold _5-10___ seconds. Repeat __3-5__ times per set. Do __1__ sets per session. Do __2__ sessions per day.  Towel Roll Squeeze    With right forearm resting on surface, gently squeeze towel. Repeat __10-20__ times per set. Do __1__ sets per session. Do __1-2__ sessions per day.  Scapular Retraction (Standing)    With arms at sides, pinch shoulder blades together. Hold 5-10 sec Repeat __5-10__ times per set. Do __1__ sets per session. Do __4-5__ sessions per day.  Copyright  VHI. All rights reserved.

## 2016-06-29 ENCOUNTER — Ambulatory Visit
Admission: RE | Admit: 2016-06-29 | Discharge: 2016-06-29 | Disposition: A | Discharge status: Home / Self Care | Payer: 59 | Source: Ambulatory Visit | Attending: Endocrinology | Admitting: Endocrinology

## 2016-06-29 ENCOUNTER — Inpatient Hospital Stay: Admission: RE | Admit: 2016-06-29 | Payer: 59 | Source: Ambulatory Visit

## 2016-06-29 DIAGNOSIS — N63 Unspecified lump in unspecified breast: Secondary | ICD-10-CM

## 2016-07-15 ENCOUNTER — Encounter: Payer: Self-pay | Admitting: Physical Therapy

## 2016-07-15 ENCOUNTER — Ambulatory Visit (INDEPENDENT_AMBULATORY_CARE_PROVIDER_SITE_OTHER): Payer: 59 | Admitting: Physical Therapy

## 2016-07-15 DIAGNOSIS — M25612 Stiffness of left shoulder, not elsewhere classified: Secondary | ICD-10-CM | POA: Diagnosis not present

## 2016-07-15 DIAGNOSIS — M6281 Muscle weakness (generalized): Secondary | ICD-10-CM

## 2016-07-15 NOTE — Therapy (Signed)
[place], [place], [address] Phone: [phone]   Fax:  [phone]  Physical Therapy Treatment  Patient Details  Name: Erik White MRN: [id number] Date of Birth: 09/08/1996 Referring Provider: Dr Ander Slade  Encounter Date: 07/15/2016      PT End of Session - 07/15/16 1115    Visit Number 2   Number of Visits 16   Date for PT Re-Evaluation 08/14/16   PT Start Time 1110   PT Stop Time (P)  1158   PT Time Calculation (min) (P)  48 min      Past Medical History:  Diagnosis Date  . GERD (gastroesophageal reflux disease)   . Headache    migraines  . Mesenteric adenitis 2005    Past Surgical History:  Procedure Laterality Date  . SHOULDER ARTHROSCOPY WITH LABRAL REPAIR Left 05/28/2016   Procedure: LEFT SHOULDER ARTHROSCOPY WITH ANTERIOR AND POSTERIOR CAPSULOLABRAL RECONSTRUCTION;  Surgeon: Justice Britain, MD;  Location: [place];  Service: Orthopedics;  Laterality: Left;  . WISDOM TOOTH EXTRACTION      There were no vitals filed for this visit.      Subjective Assessment - 07/15/16 1111    Subjective Patient comes in for second visit, he is out of the sling. Saw MD last week, he said the only restriction is no full flexion of the Lt shoulder. He has been performing isometrics. Had a biopsy of the mass on the Lt breast and it is negative.    Patient Stated Goals get arm use back, return to basketball.    Currently in Pain? No/denies            [facility] PT Assessment - 07/15/16 0001      Assessment   Medical Diagnosis Lt shoulder SLAP repair   Referring Provider Dr Ander Slade   Onset Date/Surgical Date 05/28/16   Hand Dominance Right     ROM / Strength   AROM / PROM / Strength AROM;Strength     AROM   Right/Left Shoulder Left   Left Shoulder Flexion 155 Degrees   Left Shoulder ABduction 126 Degrees   Left Shoulder Internal Rotation 82 Degrees   Left Shoulder External Rotation 47  Degrees     PROM   Left Shoulder External Rotation 52 Degrees     Strength   Right/Left Shoulder --  Rt WNL, Lt grossly 4+/5 with break test   Right/Left Elbow --  bilat WNL                     [facility] Adult PT Treatment/Exercise - 07/15/16 0001      Shoulder Exercises: Supine   Horizontal ABduction Strengthening;Both;Theraband  3x10   Theraband Level (Shoulder Horizontal ABduction) Level 2 (Red)   External Rotation Strengthening;Both;Theraband  3x10   Theraband Level (Shoulder External Rotation) Level 2 (Red)     Shoulder Exercises: Prone   Other Prone Exercises 3x10 T's off corner of bed.      Shoulder Exercises: Standing   External Rotation Strengthening;Left;Theraband  3x10, towel under elbow   Theraband Level (Shoulder External Rotation) Level 2 (Red)   Internal Rotation Strengthening;Left;Theraband  3x10   Theraband Level (Shoulder Internal Rotation) Level 3 (Green)     Shoulder Exercises: Stretch   External Rotation Stretch 3 reps  45 sec in door way     Modalities   Modalities Cryotherapy     Cryotherapy   Number Minutes Cryotherapy 12 Minutes   Cryotherapy  Location Shoulder  Lt    Type of Cryotherapy Ice pack                  PT Short Term Goals - 07/15/16 1116      PT SHORT TERM GOAL #1   Title I with initial HEP ( 07/16/16)    Status Achieved     PT SHORT TERM GOAL #3   Title tolerate initial resistance ex without pain flare up ( 07/16/16)    Status Achieved     PT SHORT TERM GOAL #4   Title FOTO =/< 50% limited ( 07/16/16)   Status On-going           PT Long Term Goals - 07/15/16 1116      PT LONG TERM GOAL #1   Title I with advanced HEP ( 08/14/16)      PT LONG TERM GOAL #2   Title demo full Lt shoulder ROM ( 08/14/16)    Status On-going     PT LONG TERM GOAL #3   Title demo Lt shoulder strength =/> 4+/5 ( 08/14/16)    Status On-going     PT LONG TERM GOAL #4   Title tolerate light tossing of basketball  ( 08/14/16)    Status On-going     PT LONG TERM GOAL #5   Title improve FOTO =/< 22% limited ( 08/14/16)                Plan - 07/15/16 1359    Clinical Impression Statement Suvir is now 7 weeks s/p Lt shoulder surgery. He saw his MD last week and had the sling discharged and told to limit his overhead reaching.  He has some higher level weakness in his upper back and shoulder and stiffness in ER    Rehab Potential Good   PT Frequency 2x / week   PT Duration 8 weeks   PT Treatment/Interventions Moist Heat;Therapeutic exercise;Dry needling;Taping;Vasopneumatic Device;Cryotherapy;Iontophoresis 4mg /ml Dexamethasone;Patient/family education;Passive range of motion   PT Next Visit Plan progress strengthening, manual work for ER    Consulted and Agree with Plan of Care Patient      Patient will benefit from skilled therapeutic intervention in order to improve the following deficits and impairments:  Decreased strength, Postural dysfunction, Pain, Impaired UE functional use, Decreased range of motion  Visit Diagnosis: Stiffness of left shoulder, not elsewhere classified  Muscle weakness (generalized)     Problem List Patient Active Problem List   Diagnosis Date Noted  . SLAP lesion of shoulder 05/01/2016  . Elevated TSH 04/28/2016  . 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 Breast mass in male 04/20/2016  . 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 Patellar tendonitis of left knee 04/20/2016    Jeral Pinch PT  07/15/2016, 2:00 [place], [place], [address] Phone: [phone]   Fax:  [phone]  Name: ARGELIO DIJULIO MRN: [id number] Date of Birth: 27-May-1996

## 2016-07-15 NOTE — Patient Instructions (Addendum)
Strengthening: Resisted Internal Rotation - green band  [place] [phone]    Hold tubing in left hand, elbow at side and forearm out. Rotate forearm in across body. Repeat __10-20__ times per set. Do __3__ sets per session. Do __3__ sessions per day.  Strengthening: Resisted External Rotation - towel under Lt elbow, red band   Hold tubing in left hand, elbow at side and forearm across body. Rotate forearm out. Repeat _10___ times per set. Do __3__ sets per session. Do __1__ sessions per day.  Strengthening: Horizontal Abduction - with External Rotation (Prone)    Holding _0___ pound weights, raise arms out from sides, pinching shoulder blades. Keep elbows straight, thumbs up. Repeat __10-20__ times per set. Do __3__ sets per session. Do ___1_ sessions per day. Once easy, hold a soup can.   Side Pull: Double Arm   On back, knees bent, feet flat. Arms perpendicular to body, shoulder level, elbows straight but relaxed. Pull arms out to sides, elbows straight. Resistance band comes across collarbones, hands toward floor. Hold momentarily. Slowly return to starting position. Repeat _3x10__ times. Band color _red____   Shoulder Rotation: Double Arm   On back, knees bent, feet flat, elbows tucked at sides, bent 90, hands palms up. Pull hands apart and down toward floor, keeping elbows near sides. Hold momentarily. Slowly return to starting position. Repeat _3x10__ times. Band color __red____   ROM: External Rotation (Alternate)    Keep palm of left hand against door frame and elbow bent at 90. Turn body from fixed hand until stretch is felt. Hold __45__ seconds. Repeat _3___ times per set. Do ___1_ sets per session. Do __1-2__ sessions per day.  Copyright  VHI. All rights reserved.

## 2016-07-21 ENCOUNTER — Ambulatory Visit (INDEPENDENT_AMBULATORY_CARE_PROVIDER_SITE_OTHER): Payer: 59 | Admitting: Physical Therapy

## 2016-07-21 DIAGNOSIS — M25612 Stiffness of left shoulder, not elsewhere classified: Secondary | ICD-10-CM

## 2016-07-21 DIAGNOSIS — M6281 Muscle weakness (generalized): Secondary | ICD-10-CM | POA: Diagnosis not present

## 2016-07-21 DIAGNOSIS — M25511 Pain in right shoulder: Secondary | ICD-10-CM

## 2016-07-21 NOTE — Patient Instructions (Addendum)
Wall Push-Up - perform two variations  First - counter push ups, hands wide and then close 2-3 sets of 8.   Second - on wall transition to left elbow, the right , push back up with left hand , 2-3 sets of 10.    With feet and hands shoulder-width apart, lean into wall, then push away from wall. Repeat ____ times or for ____ minutes. Do __1__ sessions per day.  See above directions  Scapular: Retraction in External Rotation    With hands clasped behind head, elbows up, pull elbows back, pinching shoulder blades together. Repeat _10-20__ times per set. Do __1__ sets per session. Do __1_ sessions per day.  ROM: External Rotation - Wand (Supine) - Then hold 2-4# wt in Left hand stretch into external rotation, hold 3-5 minutes,    Lie on back holding wand with elbows bent to 90. Rotate forearms over head as far as possible.  Repeat _10-20___ times per set. Do __1__ sets per session. Do __1__ sessions per day.   Copyright  VHI. All rights reserved.

## 2016-07-21 NOTE — Therapy (Signed)
[facility] [place], [place], [address] Phone: [phone]   Fax:  [phone]  Physical Therapy Treatment  Patient Details  Name: Erik White MRN: [id number] Date of Birth: 10/06/1995 Referring Provider: Dr Ander Slade   Encounter Date: 07/21/2016      PT End of Session - 07/21/16 1100    Visit Number 3   Number of Visits 16   Date for PT Re-Evaluation 08/14/16   PT Start Time 1100   PT Stop Time 1157   PT Time Calculation (min) 57 min   Activity Tolerance Patient tolerated treatment well      Past Medical History:  Diagnosis Date  . GERD (gastroesophageal reflux disease)   . Headache    migraines  . Mesenteric adenitis 2005    Past Surgical History:  Procedure Laterality Date  . SHOULDER ARTHROSCOPY WITH LABRAL REPAIR Left 05/28/2016   Procedure: LEFT SHOULDER ARTHROSCOPY WITH ANTERIOR AND POSTERIOR CAPSULOLABRAL RECONSTRUCTION;  Surgeon: Justice Britain, MD;  Location: [facility];  Service: Orthopedics;  Laterality: Left;  . WISDOM TOOTH EXTRACTION      There were no vitals filed for this visit.      Subjective Assessment - 07/21/16 1103    Subjective Kayvon reports all his HEP is easy except for the ER stretch in the doorway.  He is having some Rt shoulder discomfort now however not enough to worry about.    Patient Stated Goals get arm use back, return to basketball.    Currently in Pain? No/denies            [facility] PT Assessment - 07/21/16 0001      Assessment   Medical Diagnosis Lt shoulder SLAP repair   Referring Provider Dr Ander Slade    Onset Date/Surgical Date 05/28/16   Hand Dominance Right   Next MD Visit 08/05/16     AROM   Right/Left Shoulder Left   Left Shoulder Flexion 162 Degrees   Left Shoulder ABduction 124 Degrees   Left Shoulder Internal Rotation 90 Degrees   Left Shoulder External Rotation 55 Degrees     PROM   Left Shoulder External Rotation 64 Degrees                      [facility] Adult PT Treatment/Exercise - 07/21/16 0001      Shoulder Exercises: Supine   External Rotation AAROM;10 reps  dowel hands over head.    Other Supine Exercises overhead reach with yoga strap around wrists for stretching x 20    Other Supine Exercises scap retraction with hands clasped behind head.      Shoulder Exercises: Standing   External Rotation Strengthening;Left  ball dribbles with arm abducted close to 90 degrees.    Other Standing Exercises counter top push ups hands wide and close  wall push ups transitioning elbow to from hands     Shoulder Exercises: ROM/Strengthening   UBE (Upper Arm Bike) L4x4' alt FWD/BWD  standing on upside down BOSU     Modalities   Modalities Vasopneumatic     Vasopneumatic   Number Minutes Vasopneumatic  15 minutes   Vasopnuematic Location  Shoulder   Vasopneumatic Pressure Low   Vasopneumatic Temperature  3*     Manual Therapy   Manual Therapy Joint mobilization;Passive ROM   Manual therapy comments low load long duration ER stretch with 3#    Joint Mobilization Lt shoulder grade III AP GH mobs, inferior mobs, ER  mobd   Passive ROM IR/ER                   PT Short Term Goals - 07/15/16 1116      PT SHORT TERM GOAL #1   Title I with initial HEP ( 07/16/16)    Status Achieved     PT SHORT TERM GOAL #3   Title tolerate initial resistance ex without pain flare up ( 07/16/16)    Status Achieved     PT SHORT TERM GOAL #4   Title FOTO =/< 50% limited ( 07/16/16)   Status On-going           PT Long Term Goals - 07/21/16 1120      PT LONG TERM GOAL #1   Title I with advanced HEP ( 08/14/16)    Status On-going     PT LONG TERM GOAL #3   Title demo Lt shoulder strength =/> 4+/5 ( 08/14/16)      PT LONG TERM GOAL #4   Title tolerate light tossing of basketball ( 08/14/16)    Status On-going     PT LONG TERM GOAL #5   Title improve FOTO =/< 22% limited ( 08/14/16)    Status  On-going               Plan - 07/21/16 1146    Clinical Impression Statement Bogdan continues to have improvements in Lt shoulder ROM, limited in abduction and ER. Strength is also improving.  Needs scapular stabilization exercise as he tends to use compenatory motion with reaching up.  Corrects with VC for form.    Rehab Potential Good   PT Frequency 2x / week   PT Duration 8 weeks   PT Treatment/Interventions Moist Heat;Therapeutic exercise;Dry needling;Taping;Vasopneumatic Device;Cryotherapy;Iontophoresis 4mg /ml Dexamethasone;Patient/family education;Passive range of motion   PT Next Visit Plan FOTO and reassess for MD appointment   Consulted and Agree with Plan of Care Patient      Patient will benefit from skilled therapeutic intervention in order to improve the following deficits and impairments:  Decreased strength, Postural dysfunction, Pain, Impaired UE functional use, Decreased range of motion  Visit Diagnosis: Stiffness of left shoulder, not elsewhere classified  Muscle weakness (generalized)  Acute pain of right shoulder     Problem List Patient Active Problem List   Diagnosis Date Noted  . SLAP lesion of shoulder 05/01/2016  . Elevated TSH 04/28/2016  . 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 Breast mass in male 04/20/2016  . 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 Patellar tendonitis of left knee 04/20/2016    [name] PT 07/21/2016, 11:48 AM  [facility] [place], [place], [address] Phone: [phone]   Fax:  [phone]  Name: Erik White MRN: [id number] Date of Birth: 07-13-1996

## 2016-07-29 ENCOUNTER — Encounter: Payer: 59 | Admitting: Physical Therapy

## 2016-07-31 ENCOUNTER — Ambulatory Visit (INDEPENDENT_AMBULATORY_CARE_PROVIDER_SITE_OTHER): Payer: 59 | Admitting: Physical Therapy

## 2016-07-31 ENCOUNTER — Encounter: Payer: Self-pay | Admitting: Physical Therapy

## 2016-07-31 DIAGNOSIS — M6281 Muscle weakness (generalized): Secondary | ICD-10-CM | POA: Diagnosis not present

## 2016-07-31 DIAGNOSIS — M25612 Stiffness of left shoulder, not elsewhere classified: Secondary | ICD-10-CM

## 2016-07-31 DIAGNOSIS — M25511 Pain in right shoulder: Secondary | ICD-10-CM | POA: Diagnosis not present

## 2016-07-31 NOTE — Therapy (Signed)
[facility] [place], [place], [address] Phone: [phone]   Fax:  [phone]  Physical Therapy Treatment  Patient Details  Name: Erik White MRN: [id number] Date of Birth: Nov 02, 1995 Referring Provider: Dr Ander Slade  Encounter Date: 07/31/2016      PT End of Session - 07/31/16 0856    Visit Number 4   Number of Visits 16   Date for PT Re-Evaluation 08/14/16   PT Start Time 0851   PT Stop Time 0931   PT Time Calculation (min) 40 min   Activity Tolerance Patient tolerated treatment well      Past Medical History:  Diagnosis Date  . GERD (gastroesophageal reflux disease)   . Headache    migraines  . Mesenteric adenitis 2005    Past Surgical History:  Procedure Laterality Date  . SHOULDER ARTHROSCOPY WITH LABRAL REPAIR Left 05/28/2016   Procedure: LEFT SHOULDER ARTHROSCOPY WITH ANTERIOR AND POSTERIOR CAPSULOLABRAL RECONSTRUCTION;  Surgeon: Justice Britain, MD;  Location: [facility];  Service: Orthopedics;  Laterality: Left;  . WISDOM TOOTH EXTRACTION      There were no vitals filed for this visit.      Subjective Assessment - 07/31/16 0852    Subjective Pt reports he will have occassional pain with reaching diagonal from his body, it will be sharp and then go away when he moves the arm.    Patient Stated Goals get arm use back, return to basketball.    Currently in Pain? Yes   Pain Score 1    Pain Location Shoulder   Pain Orientation Right   Pain Descriptors / Indicators Dull   Pain Type Surgical pain   Pain Onset More than a month ago   Pain Frequency Intermittent   Aggravating Factors  reaching out diagonal   Pain Relieving Factors moving out of position            [facility] PT Assessment - 07/31/16 0001      Assessment   Medical Diagnosis Lt shoulder SLAP repair   Referring Provider Dr Ander Slade   Onset Date/Surgical Date 05/28/16   Hand Dominance Right   Next MD Visit 08/05/16      AROM   Right/Left Shoulder Left   Left Shoulder Flexion 165 Degrees   Left Shoulder ABduction 178 Degrees  with pain   Left Shoulder Internal Rotation 93 Degrees   Left Shoulder External Rotation 70 Degrees     PROM   Left Shoulder External Rotation 80 Degrees     Strength   Overall Strength --  mid trap Rt 5/5, Lt 5-/5, low trap Rt 5-/5, Lt 4+/5   Strength Assessment Site Shoulder   Right/Left Shoulder Left   Left Shoulder Flexion --  5-/5   Left Shoulder ABduction 5/5   Left Shoulder Internal Rotation 5/5   Left Shoulder External Rotation --  5-/5   Left Shoulder Horizontal ABduction --  supraspinatus 5-/5                     [facility] Adult PT Treatment/Exercise - 07/31/16 0001      Self-Care   Self-Care Other Self-Care Comments   Other Self-Care Comments  self trigger point release to posterior Lt shoulder with ball.      Exercises   Exercises Shoulder     Shoulder Exercises: Prone   Other Prone Exercises 10 push ups   Other Prone Exercises 2x7 fitter planks, side to side  Shoulder Exercises: Standing   Other Standing Exercises 2x10 green band Lt UE, SASH, pull downs and bilat ER with arms abducted   Other Standing Exercises push throw blue weighted ball at rebounder  dribbling with gym ball.      Shoulder Exercises: ROM/Strengthening   UBE (Upper Arm Bike) L5x6' alt FWD/BWD on Bosu upside down                  PT Short Term Goals - 07/31/16 0920      PT SHORT TERM GOAL #2   Title demo Lt shoulder PROM WFL ( 07/16/16)    Status Achieved     PT SHORT TERM GOAL #4   Title FOTO =/< 50% limited ( 07/16/16)   Status Achieved           PT Long Term Goals - 07/31/16 0920      PT LONG TERM GOAL #1   Title I with advanced HEP ( 08/14/16)    Status Achieved     PT LONG TERM GOAL #2   Title demo full Lt shoulder ROM ( 08/14/16)    Status Achieved     PT LONG TERM GOAL #3   Title demo Lt shoulder strength =/> 4+/5 ( 08/14/16)     Status Achieved     PT LONG TERM GOAL #4   Title tolerate light tossing of basketball ( 08/14/16)    Status Achieved     PT LONG TERM GOAL #5   Title improve FOTO =/< 22% limited ( 08/14/16)    Status Achieved  18% limited               Plan - 07/31/16 0931    Clinical Impression Statement Erik White is doing great, he has met all his goals, has good shoulder ROM and strength. He has not returned to basketball or full lifting however is going to slowly return to this.    PT Next Visit Plan plan on discharge unless MD feels like he needs more.  Has appointment next week.    Consulted and Agree with Plan of Care Patient      Patient will benefit from skilled therapeutic intervention in order to improve the following deficits and impairments:     Visit Diagnosis: Stiffness of left shoulder, not elsewhere classified  Muscle weakness (generalized)  Acute pain of right shoulder     Problem List Patient Active Problem List   Diagnosis Date Noted  . SLAP lesion of shoulder 05/01/2016  . Elevated TSH 04/28/2016  . 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 Breast mass in male 04/20/2016  . 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 Patellar tendonitis of left knee 04/20/2016    Jeral Pinch PT 07/31/2016, 9:35 AM  [facility] [place], [place], [address] Phone: [phone]   Fax:  [phone]  Name: Erik White MRN: [id number] Date of Birth: 09-Aug-1996

## 2016-07-31 NOTE — Patient Instructions (Addendum)
Strengthening: Resisted Diagonal   Slowly return to upper body work at [employer].     Hold tubing with left arm down across body, thumb pointing back. Pull arm up and out, rotating arm to palm forward. Repeat _10___ times per set. Do _2-3___ sets per session. Do ___1_ sessions per day. Green band  PNF Strengthening: Resisted    Standing, hold resistive band above head. Bring leftt arm down and out from side. Repeat _10___ times per set. Do __2-3__ sets per session. Do __1__ sessions per day. Green band  Resisted External Rotation: Abduction - Bilateral    Face anchor, tubing end in each hand. Keep arms elevated and elbows bent to 90, forearms forward. Pinch shoulder blades together and rotate forearms up. Repeat __10__ times per set. Do __2-3__ sets per session. Do __1__ sessions per day. Green band  Push-Up    With toes on ground, feet together, hands shoulder-width apart, and chest on floor, push up by straightening arms. Then lower body slowly to start position. Repeat __as tolerated __ times or for ____ minutes. Do ____ sessions per day.  Copyright  VHI. All rights reserved.

## 2016-12-23 DIAGNOSIS — H5213 Myopia, bilateral: Secondary | ICD-10-CM | POA: Diagnosis not present

## 2017-01-07 DIAGNOSIS — L700 Acne vulgaris: Secondary | ICD-10-CM | POA: Diagnosis not present

## 2017-01-07 DIAGNOSIS — L308 Other specified dermatitis: Secondary | ICD-10-CM | POA: Diagnosis not present

## 2017-01-08 MED FILL — TRIAMCINOLONE 0.1% OINTMENT: 0.1 | 14 days supply | Qty: 60 | Fill #0

## 2017-04-27 ENCOUNTER — Encounter: Payer: Self-pay | Admitting: Family Medicine

## 2017-04-27 ENCOUNTER — Ambulatory Visit (INDEPENDENT_AMBULATORY_CARE_PROVIDER_SITE_OTHER): Payer: 59 | Admitting: Family Medicine

## 2017-04-27 DIAGNOSIS — M25511 Pain in right shoulder: Secondary | ICD-10-CM | POA: Diagnosis not present

## 2017-04-27 DIAGNOSIS — M899 Disorder of bone, unspecified: Secondary | ICD-10-CM | POA: Diagnosis not present

## 2017-04-27 MED ORDER — NAPROXEN 500 MG PO TABS: 500.0000 mg | ORAL_TABLET | Freq: Two times a day (BID) | ORAL | 1 refills | Status: DC

## 2017-04-27 MED ORDER — CYCLOBENZAPRINE HCL 10 MG PO TABS: 10.0000 mg | ORAL_TABLET | Freq: Every evening | ORAL | 2 refills | Status: DC | PRN

## 2017-04-27 NOTE — Progress Notes (Signed)
Erik White is a 21 y.o. male who presents to [facility] today for right shoulder pain.  Patient has acute onset of pain in the right inferior shoulder blade area present for about for 5 days. This resulted after her shoulder muscle strain at work. He notes pain with shoulder shrugging shoulder motion. The pain is quite severe at times. He's tried naproxen which has helped. He denies any radiating pain weakness or numbness.  He also notes a separate shoulder pain has been ongoing for 5 months. This is located in the lateral upper arm and into the shoulder region. This is worse with generalized shoulder motion. She denies clicking or popping. He notes he feels somewhat consistent with a labrum tear that he had in the left shoulder. He has not tried much treatment for this yet.   Past Medical History:  Diagnosis Date  . GERD (gastroesophageal reflux disease)   . Headache    migraines  . Mesenteric adenitis 2005   Past Surgical History:  Procedure Laterality Date  . SHOULDER ARTHROSCOPY WITH LABRAL REPAIR Left 05/28/2016   Procedure: LEFT SHOULDER ARTHROSCOPY WITH ANTERIOR AND POSTERIOR CAPSULOLABRAL RECONSTRUCTION;  Surgeon: Justice Britain, MD;  Location: [facility];  Service: Orthopedics;  Laterality: Left;  . WISDOM TOOTH EXTRACTION     Social History  Substance Use Topics  . Smoking status: Never Smoker  . Smokeless tobacco: Never Used  . Alcohol use No     ROS:  As above   Medications: Current Outpatient Prescriptions  Medication Sig Dispense Refill  . cyclobenzaprine (FLEXERIL) 10 MG tablet Take 1 tablet (10 mg total) by mouth at bedtime as needed for muscle spasms. 30 tablet 2  . naproxen (NAPROSYN) 500 MG tablet Take 1 tablet (500 mg total) by mouth 2 (two) times daily with a meal. 60 tablet 1   No current facility-administered medications for this visit.    Allergies  Allergen Reactions  . No Known Allergies      Exam:    BP 131/69   Pulse 78   Wt 213 lb (96.6 kg)   SpO2 100%   BMI 28.10 kg/m  General: Well Developed, well nourished, and in no acute distress.  Neuro/Psych: Alert and oriented x3, extra-ocular muscles intact, able to move all 4 extremities, sensation grossly intact. Skin: Warm and dry, no rashes noted.  Respiratory: Not using accessory muscles, speaking in full sentences, trachea midline.  Cardiovascular: Pulses palpable, no extremity edema. Abdomen: Does not appear distended. MSK:  C-spine: Nontender to midline normal neck motion  Right shoulder normal-appearing nontender.  Normal range of motion Negative Hawkins and Neer's test. Mildly positive empty can test. Mildly positive O'Brien's test. Mildly positive anterior apprehension and relocation test. Strength is intact. Negative sulcus sign. Negative relocation clunk test.  Right thoracic: Tender to palpation along the inferior portion of the face right scapula. This is worse with shoulder abduction.    No results found for this or any previous visit (from the past 48 hour(s)). No results found.    Assessment and Plan: 21 y.o. male with  Patient has 2 causes of shoulder pain today. Right periscapular muscle dysfunction. This is likely due to latissimus injury. Plan for naproxen Flexeril heating pad and referral to physical therapy. Recheck as needed. Work note provided.  Right shoulder pain: Weekly more chronic in nature. Patient may have a labrum tear however he does not have significant instability. I think a trial of physical therapy is warranted  however if not better likely will proceed with MRI arthrogram.    Orders Placed This Encounter  Procedures  . Ambulatory referral to Physical Therapy    Referral Priority:   Routine    Referral Type:   Physical Medicine    Referral Reason:   Specialty Services Required    Requested Specialty:   Physical Therapy   Meds ordered this encounter  Medications  . naproxen  (NAPROSYN) 500 MG tablet    Sig: Take 1 tablet (500 mg total) by mouth 2 (two) times daily with a meal.    Dispense:  60 tablet    Refill:  1  . cyclobenzaprine (FLEXERIL) 10 MG tablet    Sig: Take 1 tablet (10 mg total) by mouth at bedtime as needed for muscle spasms.    Dispense:  30 tablet    Refill:  2    Discussed warning signs or symptoms. Please see discharge instructions. Patient expresses understanding.

## 2017-04-27 NOTE — Patient Instructions (Signed)
Thank you for coming in today. Attend PT for both shoulders and for Lat strain right side.  Use naproxen twice daily for pain as needed.  Take flexeril at bedtime as needed.  Recheck with me in 6 weeks or sooner if needed.

## 2017-06-08 ENCOUNTER — Ambulatory Visit: Payer: 59

## 2017-12-17 DIAGNOSIS — M9902 Segmental and somatic dysfunction of thoracic region: Secondary | ICD-10-CM | POA: Diagnosis not present

## 2017-12-17 DIAGNOSIS — M531 Cervicobrachial syndrome: Secondary | ICD-10-CM | POA: Diagnosis not present

## 2017-12-17 DIAGNOSIS — M791 Myalgia, unspecified site: Secondary | ICD-10-CM | POA: Diagnosis not present

## 2017-12-17 DIAGNOSIS — M9901 Segmental and somatic dysfunction of cervical region: Secondary | ICD-10-CM | POA: Diagnosis not present

## 2017-12-17 DIAGNOSIS — M9903 Segmental and somatic dysfunction of lumbar region: Secondary | ICD-10-CM | POA: Diagnosis not present

## 2017-12-17 DIAGNOSIS — M9905 Segmental and somatic dysfunction of pelvic region: Secondary | ICD-10-CM | POA: Diagnosis not present

## 2017-12-20 DIAGNOSIS — M791 Myalgia, unspecified site: Secondary | ICD-10-CM | POA: Diagnosis not present

## 2017-12-20 DIAGNOSIS — M9905 Segmental and somatic dysfunction of pelvic region: Secondary | ICD-10-CM | POA: Diagnosis not present

## 2017-12-20 DIAGNOSIS — M531 Cervicobrachial syndrome: Secondary | ICD-10-CM | POA: Diagnosis not present

## 2017-12-20 DIAGNOSIS — M9903 Segmental and somatic dysfunction of lumbar region: Secondary | ICD-10-CM | POA: Diagnosis not present

## 2017-12-20 DIAGNOSIS — M9902 Segmental and somatic dysfunction of thoracic region: Secondary | ICD-10-CM | POA: Diagnosis not present

## 2017-12-20 DIAGNOSIS — M9901 Segmental and somatic dysfunction of cervical region: Secondary | ICD-10-CM | POA: Diagnosis not present

## 2017-12-25 DIAGNOSIS — M9902 Segmental and somatic dysfunction of thoracic region: Secondary | ICD-10-CM | POA: Diagnosis not present

## 2017-12-25 DIAGNOSIS — M791 Myalgia, unspecified site: Secondary | ICD-10-CM | POA: Diagnosis not present

## 2017-12-25 DIAGNOSIS — M531 Cervicobrachial syndrome: Secondary | ICD-10-CM | POA: Diagnosis not present

## 2017-12-25 DIAGNOSIS — M9901 Segmental and somatic dysfunction of cervical region: Secondary | ICD-10-CM | POA: Diagnosis not present

## 2017-12-25 DIAGNOSIS — M9903 Segmental and somatic dysfunction of lumbar region: Secondary | ICD-10-CM | POA: Diagnosis not present

## 2017-12-25 DIAGNOSIS — M9905 Segmental and somatic dysfunction of pelvic region: Secondary | ICD-10-CM | POA: Diagnosis not present

## 2017-12-27 ENCOUNTER — Emergency Department
Admission: EM | Admit: 2017-12-27 | Discharge: 2017-12-27 | Disposition: A | Discharge status: Home / Self Care | Payer: 59 | Source: Home / Self Care | Attending: Emergency Medicine | Admitting: Emergency Medicine

## 2017-12-27 ENCOUNTER — Encounter: Payer: Self-pay | Admitting: Emergency Medicine

## 2017-12-27 DIAGNOSIS — K529 Noninfective gastroenteritis and colitis, unspecified: Secondary | ICD-10-CM | POA: Diagnosis not present

## 2017-12-27 LAB — POCT INFLUENZA A/B
Influenza A, POC: NEGATIVE
Influenza B, POC: NEGATIVE

## 2017-12-27 MED ORDER — CIPROFLOXACIN HCL 500 MG PO TABS: 500.0000 mg | ORAL_TABLET | Freq: Two times a day (BID) | ORAL | 0 refills | Status: DC

## 2017-12-27 MED ORDER — ONDANSETRON 4 MG PO TBDP
4.0000 mg | ORAL_TABLET | Freq: Once | ORAL | Status: AC
  Administered 2017-12-27: 4 mg via ORAL

## 2017-12-27 MED ORDER — HYOSCYAMINE SULFATE 0.125 MG PO TABS: ORAL_TABLET | ORAL | 0 refills | Status: DC

## 2017-12-27 MED ORDER — ONDANSETRON 4 MG PO TBDP: 4.0000 mg | ORAL_TABLET | Freq: Three times a day (TID) | ORAL | 0 refills | Status: DC | PRN

## 2017-12-27 NOTE — ED Provider Notes (Signed)
[name]    CSN: [id number] Arrival date & time: 12/27/17  1740     History   Chief Complaint Chief Complaint  Patient presents with  . Nausea    HPI Erik White is a 22 y.o. male.   HPI Started 2 days ago.  With nausea and vomited several times.  No blood in vomitus.  Then, progress to diarrhea with 4 loose watery stools daily, no blood but with slight mucus. Vomiting resolved last night, but he still feels nauseated.  Is been able to tolerate p.o. clear liquids and crackers a little bit this morning.  Has not tried any particular treatment. Associated symptoms: Mild fever, had chills and sweats yesterday.  Has minimal nasal congestion but no other ENT symptoms.  Denies sore throat or neck pain.  No cough or chest pain or shortness of breath. Complains of crampy diffuse abdominal pain, mild to moderate intensity, very intermittent, usually preceding having a loose stool and then the cramping improved somewhat.  Denies GU symptoms.  No rash. Past Medical History:  Diagnosis Date  . GERD (gastroesophageal reflux disease)   . Headache    migraines  . Mesenteric adenitis 2005    Patient Active Problem List   Diagnosis Date Noted  . Right shoulder pain 04/27/2017  . Scapular dysfunction 04/27/2017  . SLAP lesion of shoulder 05/01/2016  . Elevated TSH 04/28/2016  . 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 Breast mass in male 04/20/2016  . 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 Patellar tendonitis of left knee 04/20/2016    Past Surgical History:  Procedure Laterality Date  . SHOULDER ARTHROSCOPY WITH LABRAL REPAIR Left 05/28/2016   Procedure: LEFT SHOULDER ARTHROSCOPY WITH ANTERIOR AND POSTERIOR CAPSULOLABRAL RECONSTRUCTION;  Surgeon: Justice Britain, MD;  Location: [facility];  Service: Orthopedics;  Laterality: Left;  . WISDOM TOOTH EXTRACTION         Home Medications    Prior to Admission medications   Medication Sig Start Date End Date Taking?  Authorizing Provider  ciprofloxacin (CIPRO) 500 MG tablet Take 1 tablet (500 mg total) by mouth 2 (two) times daily. For 3 days 12/27/17   Jacqulyn Cane, MD  hyoscyamine (LEVSIN, ANASPAZ) 0.125 MG tablet Take one by mouth every 4-6 hours as needed for abdominal cramping or pain 12/27/17   Jacqulyn Cane, MD  ondansetron (ZOFRAN-ODT) 4 MG disintegrating tablet Take 1 tablet (4 mg total) by mouth every 8 (eight) hours as needed for nausea or vomiting. 12/27/17   Jacqulyn Cane, MD    Family History Family History  Problem Relation Age of Onset  . Heart attack Father   . Cancer Other        lung  . Cancer Other        liver CA  . Thyroid disease Mother   . Hypertension Mother     Social History Social History   Tobacco Use  . Smoking status: Never Smoker  . Smokeless tobacco: Never Used  Substance Use Topics  . Alcohol use: No  . Drug use: No     Allergies   No known allergies   Review of Systems Review of Systems  All other systems reviewed and are negative.  See also HPI  Physical Exam Triage Vital Signs ED Triage Vitals [12/27/17 1812]  Enc Vitals Group     BP 122/76     Pulse Rate (!) 106     Resp      Temp 99.1 F (37.3 C)  Temp Source Oral     SpO2 100 %     Weight 223 lb (101.2 kg)     Height      Head Circumference      Peak Flow      Pain Score 0     Pain Loc      Pain Edu?      Excl. in [place]?    No data found.  Updated Vital Signs BP 122/76 (BP Location: Right Arm)   Pulse (!) 106   Temp 99.1 F (37.3 C) (Oral)   Wt 223 lb (101.2 kg)   SpO2 100%   BMI 29.42 kg/m   Visual Acuity Right Eye Distance:   Left Eye Distance:   Bilateral Distance:    Right Eye Near:   Left Eye Near:    Bilateral Near:     Physical Exam  Constitutional: He is oriented to person, place, and time. He appears well-developed and well-nourished. No distress.  Alert, cooperative.  Appears fatigued, appears ill, but not toxic.  HENT:  Right Ear: External ear  normal.  Left Ear: External ear normal.  Nose: Nose normal.  Mouth/Throat: Oropharynx is clear and moist. No oropharyngeal exudate.  Eyes: Conjunctivae are normal. Right eye exhibits no discharge. Left eye exhibits no discharge. No scleral icterus.  Neck: Normal range of motion. Neck supple.  Cardiovascular: Normal rate, regular rhythm and normal heart sounds.  Pulmonary/Chest: Breath sounds normal.  Abdominal: Soft. He exhibits no abdominal bruit and no pulsatile midline mass. Bowel sounds are increased. There is no hepatosplenomegaly. There is tenderness (Minimal, no localized tenderness). There is no rigidity, no rebound, no guarding, no CVA tenderness, no tenderness at McBurney's point and negative Murphy's sign.  Musculoskeletal: Normal range of motion.  Neurological: He is alert and oriented to person, place, and time. No cranial nerve deficit.  Skin: Skin is warm and dry. No rash noted. He is not diaphoretic.  Psychiatric: He has a normal mood and affect.  Nursing note and vitals reviewed.    UC Treatments / Results  Labs (all labs ordered are listed, but only abnormal results are displayed) Labs Reviewed  POCT INFLUENZA A/B  Rapid flu tests negative  EKG None Radiology No results found.  Procedures Procedures (including critical care time)  Medications Ordered in UC Medications  ondansetron (ZOFRAN-ODT) disintegrating tablet 4 mg (4 mg Oral Given 12/27/17 1810)  Nausea improved 15 minutes after Zofran given   Initial Impression / Assessment and Plan / UC Course  I have reviewed the triage vital signs and the nursing notes.  Pertinent labs & imaging results that were available during my care of the patient were reviewed by me and considered in my medical decision making (see chart for details).       Final Clinical Impressions(s) / UC Diagnoses   Final diagnoses:  Gastroenteritis presumed infectious    ED Discharge Orders        Ordered    ciprofloxacin  (CIPRO) 500 MG tablet  2 times daily     12/27/17 1913    hyoscyamine (LEVSIN, ANASPAZ) 0.125 MG tablet     12/27/17 1913    ondansetron (ZOFRAN-ODT) 4 MG disintegrating tablet  Every 8 hours PRN     12/27/17 1914     An After Visit Summary was printed and given to the patient. "Flu tests today negative. Diagnosis is gastroenteritis, difficult to say whether this is viral or bacterial. To cover bacterial causes, were treating with Cipro twice  a day for 3 days. Hyoscyamine prescribed as needed for abdominal cramping or pain. Zofran prescribed as needed for nausea. Please read accompanying instructions, attached to this packet. Follow-up with your doctor if no better in 3 days, sooner if worse or new symptoms."  Controlled Substance Prescriptions [place] Controlled Substance Registry consulted? Not Applicable   Jacqulyn Cane, MD 12/29/17 1155

## 2017-12-27 NOTE — ED Triage Notes (Signed)
Pt c/o N+V+D x2 days. States he has not vomited today. Eating crackers and drinking ginger ale

## 2017-12-27 NOTE — Discharge Instructions (Addendum)
Flu tests today negative. Diagnosis is gastroenteritis, difficult to say whether this is viral or bacterial. To cover bacterial causes, were treating with Cipro twice a day for 3 days. Hyoscyamine prescribed as needed for abdominal cramping or pain. Zofran prescribed as needed for nausea. Please read accompanying instructions, attached to this packet. Follow-up with your doctor if no better in 3 days, sooner if worse or new symptoms.

## 2018-02-16 DIAGNOSIS — J029 Acute pharyngitis, unspecified: Secondary | ICD-10-CM | POA: Diagnosis not present

## 2018-02-19 ENCOUNTER — Ambulatory Visit (INDEPENDENT_AMBULATORY_CARE_PROVIDER_SITE_OTHER): Payer: Self-pay | Admitting: Family Medicine

## 2018-02-19 VITALS — BP 124/68 | HR 70 | Temp 98.7°F | Resp 16 | Wt 229.8 lb

## 2018-02-19 DIAGNOSIS — J029 Acute pharyngitis, unspecified: Secondary | ICD-10-CM

## 2018-02-19 DIAGNOSIS — K1370 Unspecified lesions of oral mucosa: Secondary | ICD-10-CM

## 2018-02-19 LAB — POCT RAPID STREP A (OFFICE): Rapid Strep A Screen: NEGATIVE

## 2018-02-19 MED ORDER — VALACYCLOVIR HCL 1 G PO TABS: 2000.0000 mg | ORAL_TABLET | Freq: Two times a day (BID) | ORAL | 0 refills | Status: AC

## 2018-02-19 NOTE — Patient Instructions (Signed)
Herpes Simplex Test There are two common types of herpes simplex virus (HSV). These are classified as Type 1 (HSV1) or Type 2 (HSV2). Type 1 often causes cold sores on or around the mouth and sometimes on or around the eyes. Type 2 is commonly known as a sexually transmitted infection that causes sores in and around the genitals. Both types of herpes simplex can cause sores in different areas. There are two types of herpes simplex tests. These include:  Culture. This consists of collecting and testing a sample of fluid with a cotton swab from an open sore. This can only be done during an active infection (outbreak). Culture tests take several days to complete but are very accurate.  HSV blood tests. This test is not as accurate as a culture. However, HSV blood tests are faster than cultures, often providing a test result within one day. ? HSV antibody test. This checks for the presence of antibodies against HSV in your blood. Antibodies are proteins your body makes to help fight infection. ? HSV antigen test. This checks for the presence of the HSV germ (antigen) in your blood.  Your health care provider may recommend you have a HSV test if:  He or she believes you have a HSV infection.  You have a weakened immune system (immunocompromised) and you have sores around your mouth or genitals that look like HSV eruptions.  You have a fever of unknown origin (FUO).  You are pregnant, have herpes, and are expecting to deliver a baby vaginally in the next 6-8 weeks.  What do the results mean? It is your responsibility to obtain your test results. Ask the lab or department performing the test when and how you will get your results. Contact your health care provider to discuss any questions you have about your results. Range of Normal Values Ranges for normal values may vary among different labs and hospitals. You should always check with your health care provider after having lab work or other tests  done to discuss whether your values are considered within normal limits. Normal findings include:  No HSV antigen or antibodies present in your blood.  No HSV antigen present in cultured fluid.  Meaning of Results Outside Normal Ranges The following test results may indicate that you have an active HSV infection:  Positive culture for HSV1 or HSV2.  Presence of HSV1 or HSV2 antigens in your blood.  Presence of certain HSV1 or HSV2 antibodies (IgM) in your blood.  Discuss your test results with your health care provider. He or she will use the results to make a diagnosis and determine a treatment plan that is right for you. Talk with your health care provider to discuss your results, treatment options, and if necessary, the need for more tests. Talk with your health care provider if you have any questions about your results. This information is not intended to replace advice given to you by your health care provider. Make sure you discuss any questions you have with your health care provider. Document Released: 10/24/2004 Document Revised: 05/27/2016 Document Reviewed: 02/06/2014 Elsevier Interactive Patient Education  2018 Elsevier Inc.  

## 2018-02-19 NOTE — Progress Notes (Signed)
Erik White is a 22 y.o. male who presents today with concerns of sore throat x 5 days. Patient was previously examined after 2 days of symptoms by [name] who dx with viral pharyngitis and prescribed lidocaine as treatment for pain relief (rapid strep and strep culture both returned negative). He presents today with feelings of fatigue and severe throat pain that has not improved. He is accompanied by his mother today, they both participated in a health walk of 2.5 miles this morning. He is ambulatory, alert and vital signs stable. Initial BP with low diastolic, repeat manual by provider.  Review of Systems  Constitutional: Negative for chills, fever and malaise/fatigue.  HENT: Positive for sore throat. Negative for congestion, ear discharge, ear pain and sinus pain.   Eyes: Negative.   Respiratory: Negative for cough, sputum production and shortness of breath.   Cardiovascular: Negative.  Negative for chest pain.  Gastrointestinal: Negative for abdominal pain, diarrhea, nausea and vomiting.  Genitourinary: Negative for dysuria, frequency, hematuria and urgency.  Musculoskeletal: Negative for myalgias.  Skin: Negative.   Neurological: Negative for headaches.  Endo/Heme/Allergies: Negative.   Psychiatric/Behavioral: Negative.     O: Vitals:   02/19/18 1109 02/19/18 1133  BP: (!) 130/55 (!) 125/55  Pulse: 70   Resp: 16   Temp: 98.7 F (37.1 C)   SpO2: 98%      Physical Exam  Constitutional: He is oriented to person, place, and time. Vital signs are normal. He appears well-developed and well-nourished. He is active.  Non-toxic appearance. He does not have a sickly appearance.  HENT:  Head: Normocephalic.  Right Ear: Hearing, tympanic membrane, external ear and ear canal normal.  Left Ear: Hearing, tympanic membrane, external ear and ear canal normal.  Nose: Nose normal.  Mouth/Throat: Uvula is midline. Oral lesions present. No uvula swelling. Posterior oropharyngeal  erythema present. No oropharyngeal exudate, posterior oropharyngeal edema or tonsillar abscesses. Tonsils are 1+ on the right. Tonsils are 1+ on the left. No tonsillar exudate (tonsilar ulcerations pale and circular on bilateral tonsil appearance not consistent with exudate or tonsilar stones).    Multiple ulcerations present on tonsils- tongue, cheeks and hard and soft palate free from lesions or exudative material. Oral pharynx otherwise WNL on visual exam- of note patient jaw has asymmetric alignment on exam-   Neck: Normal range of motion. Neck supple.  Cardiovascular: Normal rate, regular rhythm, normal heart sounds and normal pulses.  Pulmonary/Chest: Effort normal and breath sounds normal.  Abdominal: Soft. Bowel sounds are normal.  Musculoskeletal: Normal range of motion.  Lymphadenopathy:       Head (right side): Submandibular adenopathy present. No submental adenopathy present.       Head (left side): Submandibular adenopathy present. No submental adenopathy present.    He has cervical adenopathy.       Right cervical: Superficial cervical adenopathy present.       Left cervical: Superficial cervical adenopathy present.  Neurological: He is alert and oriented to person, place, and time.  Psychiatric: He has a normal mood and affect.  Vitals reviewed.    A: 1. Sore throat   2. Oral mucosal lesion      P: Discussed suspected clinical diagnosis based on exam of oral ulcers. Discussed possibility of herpes simplex virus as the cause and recommended further evaluation by PCP. Did prescribe antiviral trial for oral herpes simplex to see if this can suppress viral load and decrease symptoms and discussed self limiting nature of sore throat and supportive  care measures like warm salt water rinses, lidocaine rinses and OTC Cepacol or fisherman's friend throat lozenges. Uptodate and Epic patient information on Herpes simplex virus and testing provided.  Exam findings, diagnosis etiology  and medication use and indications reviewed with patient. Follow- Up and discharge instructions provided. No emergent/urgent issues found on exam.  Patient verbalized understanding of information provided and agrees with plan of care (POC), all questions answered.  1. Sore throat - POCT rapid strep A   2. Oral mucosal lesion - valACYclovir (VALTREX) 1000 MG tablet; Take 2 tablets (2,000 mg total) by mouth 2 (two) times daily for 1 day.  Other orders - lidocaine (XYLOCAINE) 2 % solution - ibuprofen (ADVIL,MOTRIN) 200 MG tablet; Take 200 mg by mouth every 6 (six) hours as needed.

## 2018-02-26 DIAGNOSIS — M4316 Spondylolisthesis, lumbar region: Secondary | ICD-10-CM | POA: Diagnosis not present

## 2018-02-26 DIAGNOSIS — N50819 Testicular pain, unspecified: Secondary | ICD-10-CM | POA: Diagnosis not present

## 2018-02-26 DIAGNOSIS — N50811 Right testicular pain: Secondary | ICD-10-CM | POA: Diagnosis not present

## 2018-02-26 DIAGNOSIS — R590 Localized enlarged lymph nodes: Secondary | ICD-10-CM | POA: Diagnosis not present

## 2018-02-26 DIAGNOSIS — N5089 Other specified disorders of the male genital organs: Secondary | ICD-10-CM | POA: Diagnosis not present

## 2018-02-26 DIAGNOSIS — K7689 Other specified diseases of liver: Secondary | ICD-10-CM | POA: Diagnosis not present

## 2018-02-26 DIAGNOSIS — N62 Hypertrophy of breast: Secondary | ICD-10-CM | POA: Diagnosis not present

## 2018-02-26 DIAGNOSIS — J9811 Atelectasis: Secondary | ICD-10-CM | POA: Diagnosis not present

## 2018-02-26 LAB — CBC AND DIFFERENTIAL
PLATELETS: 197 (ref 150–399)
WBC: 12.1

## 2018-02-26 LAB — BASIC METABOLIC PANEL
BUN: 11 (ref 4–21)
Creatinine: 0.8 (ref 0.6–1.3)
Glucose: 90
Potassium: 4.5 (ref 3.4–5.3)
Sodium: 139 (ref 137–147)

## 2018-03-03 DIAGNOSIS — D4011 Neoplasm of uncertain behavior of right testis: Secondary | ICD-10-CM | POA: Diagnosis not present

## 2018-03-08 ENCOUNTER — Telehealth: Payer: Self-pay | Admitting: Family Medicine

## 2018-03-08 NOTE — Telephone Encounter (Signed)
Record received from [facility].  Erik White was seen on 5/30 by Dr Tresa Moore. He had imaging in [place] concerning for testicular cancer. On repeat exam and [place] at [facility] results were benign. Labs pending.  Plan repeat US in 3 months.

## 2018-03-10 ENCOUNTER — Ambulatory Visit: Payer: 59 | Admitting: Family Medicine

## 2018-03-10 ENCOUNTER — Encounter: Payer: Self-pay | Admitting: Family Medicine

## 2018-03-10 VITALS — BP 128/74 | HR 82 | Ht 73.0 in | Wt 232.0 lb

## 2018-03-10 DIAGNOSIS — N442 Benign cyst of testis: Secondary | ICD-10-CM

## 2018-03-10 HISTORY — DX: Benign cyst of testis: N44.2

## 2018-03-10 NOTE — Progress Notes (Signed)
Erik White is a 22 y.o. male who presents to [facility]: [facility] today for hospital follow up.   Erik White was seen in the ED in [place] on 02/26/18 for right testicle pain. He was though to have a resolving torsion or orchitis. He did have a mildly abnormal [place] of his scrotum with a 16mm cystic lesions.  Additionally had a CT scan of his abdomen and pelvis which showed a 2.4 x 1.1 cm lymph node alongside the order at the L4-L5 level.  This was somewhat concerning for possible malignancy although the node looked relatively normal otherwise.  His laboratory work-up in [place] was unremarkable including negative tumor markers and gonorrhea chlamydia.  He followed up with urology here in town who repeated ultrasound which again confirmed a nonspecific non-concerning small cystic lesion in the right testicle.  Erik White is here today for follow-up.  He notes that he no longer is having any pain in his groin and feels quite well.  He denies fevers or chills nausea vomiting diarrhea or weight loss.  He notes that he is sexually active with his girlfriend and sometimes uses condoms.  He notes that she has been recently tested negative for STDs.   ROS as above: No headache, visual changes, nausea, vomiting, diarrhea, constipation, dizziness, abdominal pain, skin rash, fevers, chills, night sweats, weight loss, swollen lymph nodes, body aches, joint swelling, muscle aches, chest pain, shortness of breath, mood changes, visual or auditory hallucinations.    Exam:  BP 128/74   Pulse 82   Ht 6\' 1"  (1.854 m)   Wt 232 lb (105.2 kg)   BMI 30.61 kg/m  Gen: Well NAD HEENT: EOMI,  MMM Lungs: Normal work of breathing. CTABL Heart: RRR no MRG Abd: NABS, Soft. Nondistended, Nontender Genitals: Testicles descended bilaterally and nontender with no masses or nodules palpated.  Penis is  normal-appearing circumcised with no lesions or discharge. Exts: Brisk capillary refill, warm and well perfused.   Lab and Radiology Results CT scan and labs reviewed from [place] emergency room. Additionally prior CT scan of the abdomen and pelvis from 2016 reviewed. Images reviewed  Assessment and Plan: 21 y.o. male with  Testicle pain now resolved testicular cystic lesion and abdominal lymph node patient is now asymptomatic and is unclear exactly what his original.  Patient is now asymptomatic and its unclear what his original cause of pain was.   He does however have a cystic lesion in his testicle that his urologist would like to follow-up with a repeat ultrasound in 3 months.  This is concerning to Erik White's mother because of this abdominal lymph node that might be enlarged.  I discussed the case with radiology who was able to review the images from [place] current ultrasound and prior CT scan from 2016.  The radiologist notes that the morphology of the lymph node has not changed very much nor has the size not changed much at all either (25mm in 2016 to 85mm per his read in 2019).    As the lymph node has not actually changed size very much at all and the appearance of the lymph node and the appearance of the testicle and ultrasound are all reassuring I think malignancy is extremely unlikely.  Additionally tumor markers are also negative which is again reassuring.  Plan for watchful waiting and repeat ultrasound in 3 months.  Additionally patient will follow-up with me in about a month to go over this issue  as well as his other medical issues.  Labs and radiology reports will be abstracted and scanned.      Historical information moved to improve visibility of documentation.  Past Medical History:  Diagnosis Date  . GERD (gastroesophageal reflux disease)   . Headache    migraines  . Mesenteric adenitis 2005   Past Surgical History:  Procedure Laterality Date  .  SHOULDER ARTHROSCOPY WITH LABRAL REPAIR Left 05/28/2016   Procedure: LEFT SHOULDER ARTHROSCOPY WITH ANTERIOR AND POSTERIOR CAPSULOLABRAL RECONSTRUCTION;  Surgeon: Justice Britain, MD;  Location: [facility];  Service: Orthopedics;  Laterality: Left;  . WISDOM TOOTH EXTRACTION     Social History   Tobacco Use  . Smoking status: Never Smoker  . Smokeless tobacco: Never Used  Substance Use Topics  . Alcohol use: No   family history includes Cancer in his other and other; Heart attack in his father; Hypertension in his mother; Thyroid disease in his mother.  Medications: No current outpatient medications on file.   No current facility-administered medications for this visit.    Allergies  Allergen Reactions  . No Known Allergies     Health Maintenance Health Maintenance  Topic Date Due  . HIV Screening  09/04/2012  . TETANUS/TDAP  09/04/2016  . INFLUENZA VACCINE  05/05/2018    Discussed warning signs or symptoms. Please see discharge instructions. Patient expresses understanding.

## 2018-03-10 NOTE — Patient Instructions (Addendum)
Thank you for coming in today. We will continue to follow.  Recheck with me as needed or in about 1-2 months.

## 2018-03-16 ENCOUNTER — Encounter: Payer: Self-pay | Admitting: Family Medicine

## 2018-04-14 DIAGNOSIS — H5213 Myopia, bilateral: Secondary | ICD-10-CM | POA: Diagnosis not present

## 2018-04-15 ENCOUNTER — Other Ambulatory Visit: Payer: Self-pay | Admitting: Urology

## 2018-04-15 DIAGNOSIS — D4010 Neoplasm of uncertain behavior of unspecified testis: Secondary | ICD-10-CM

## 2018-04-25 ENCOUNTER — Ambulatory Visit (INDEPENDENT_AMBULATORY_CARE_PROVIDER_SITE_OTHER): Payer: 59 | Admitting: Family Medicine

## 2018-04-25 ENCOUNTER — Encounter: Payer: Self-pay | Admitting: Family Medicine

## 2018-04-25 VITALS — BP 136/73 | HR 74 | Ht 73.0 in | Wt 236.0 lb

## 2018-04-25 DIAGNOSIS — Z6831 Body mass index (BMI) 31.0-31.9, adult: Secondary | ICD-10-CM | POA: Diagnosis not present

## 2018-04-25 DIAGNOSIS — Z23 Encounter for immunization: Secondary | ICD-10-CM | POA: Diagnosis not present

## 2018-04-25 DIAGNOSIS — N442 Benign cyst of testis: Secondary | ICD-10-CM | POA: Diagnosis not present

## 2018-04-25 NOTE — Progress Notes (Addendum)
       MARWAN LIPE is a 22 y.o. male who presents to [place]: [facility] today for follow-up orchitis.  Lorie was seen in June for orchitis and testicular cyst.  He did extensive work-up and evaluation and is currently feeling well with no pain.  He remains physically active with no issues.  He has a repeat ultrasound and follow-up with urology on August 5 and 12.    ROS as above:  Exam:  BP 136/73   Pulse 74   Ht 6\' 1"  (1.854 m)   Wt 236 lb (107 kg)   BMI 31.14 kg/m  Gen: Well NAD HEENT: EOMI,  MMM Lungs: Normal work of breathing. CTABL Heart: RRR no MRG Abd: NABS, Soft. Nondistended, Nontender Exts: Brisk capillary refill, warm and well perfused.   Lab and Radiology Results No results found for this or any previous visit (from the past 72 hour(s)). No results found.    Assessment and Plan: 22 y.o. male with  Orchitis and testicular cyst: Doing well.  Follow-up with urology and repeat ultrasound.  Recheck with me yearly or sooner if needed.  Work on blood pressure control and weight loss.  BMI 31.  Blood pressure minimally elevated.  Recheck in 1 year.  Tdap given today prior to discharge.   No orders of the defined types were placed in this encounter.  No orders of the defined types were placed in this encounter.    Historical information moved to improve visibility of documentation.  Past Medical History:  Diagnosis Date  . GERD (gastroesophageal reflux disease)   . Headache    migraines  . Mesenteric adenitis 2005  . 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 SHOULDER ARTHROSCOPY WITH LABRAL REPAIR Left 05/28/2016   Procedure: LEFT SHOULDER ARTHROSCOPY WITH ANTERIOR AND POSTERIOR CAPSULOLABRAL RECONSTRUCTION;  Surgeon: Justice Britain, MD;  Location: [place];  Service: Orthopedics;  Laterality: Left;  . WISDOM TOOTH  EXTRACTION     Social History   Tobacco Use  . Smoking status: Never Smoker  . Smokeless tobacco: Never Used  Substance Use Topics  . Alcohol use: No   family history includes Cancer in his other and other; Heart attack in his father; Hypertension in his mother; Thyroid disease in his mother.  Medications: Current Outpatient Medications  Medication Sig Dispense Refill  . azelastine (OPTIVAR) 0.05 % ophthalmic solution      No current facility-administered medications for this visit.    Allergies  Allergen Reactions  . No Known Allergies      Discussed warning signs or symptoms. Please see discharge instructions. Patient expresses understanding.

## 2018-04-25 NOTE — Patient Instructions (Addendum)
Thank you for coming in today. Use over-the-counter Zaditor eyedrops (Ketotifen) Use over-the-counter Zyrtec (cetirizine)   Follow up with Dr Tresa Moore as directed and repeat ultrasound as directed.  Keep an eye on weight and BP.  Recheck with me in 1 year for physical.   Tdap Vaccine (Tetanus, Diphtheria and Pertussis): What You Need to Know 1. Why get vaccinated? Tetanus, diphtheria and pertussis are very serious diseases. Tdap vaccine can protect [place] from these diseases. And, Tdap vaccine given to pregnant women can protect newborn babies against pertussis. TETANUS (Lockjaw) is rare in the [place] today. It causes painful muscle tightening and stiffness, usually all over the body.  It can lead to tightening of muscles in the head and neck so you can't open your mouth, swallow, or sometimes even breathe. Tetanus kills about 1 out of 10 people who are infected even after receiving the best medical care.  DIPHTHERIA is also rare in the [place] today. It can cause a thick coating to form in the back of the throat.  It can lead to breathing problems, heart failure, paralysis, and death.  PERTUSSIS (Whooping Cough) causes severe coughing spells, which can cause difficulty breathing, vomiting and disturbed sleep.  It can also lead to weight loss, incontinence, and rib fractures. Up to 2 in 100 adolescents and 5 in 100 adults with pertussis are hospitalized or have complications, which could include pneumonia or death.  These diseases are caused by bacteria. Diphtheria and pertussis are spread from person to person through secretions from coughing or sneezing. Tetanus enters the body through cuts, scratches, or wounds. Before vaccines, as many as 200,000 cases of diphtheria, 200,000 cases of pertussis, and hundreds of cases of tetanus, were reported in the [place] each year. Since vaccination began, reports of cases for tetanus and diphtheria have dropped by about 99% and for  pertussis by about 80%. 2. Tdap vaccine Tdap vaccine can protect adolescents and adults from tetanus, diphtheria, and pertussis. One dose of Tdap is routinely given at age 55 or 55. People who did not get Tdap at that age should get it as soon as possible. Tdap is especially important for healthcare professionals and anyone having close contact with a baby younger than 12 months. Pregnant women should get a dose of Tdap during every pregnancy, to protect the newborn from pertussis. Infants are most at risk for severe, life-threatening complications from pertussis. Another vaccine, called Td, protects against tetanus and diphtheria, but not pertussis. A Td booster should be given every 10 years. Tdap may be given as one of these boosters if you have never gotten Tdap before. Tdap may also be given after a severe cut or burn to prevent tetanus infection. Your doctor or the person giving you the vaccine can give you more information. Tdap may safely be given at the same time as other vaccines. 3. Some people should not get this vaccine  A person who has ever had a life-threatening allergic reaction after a previous dose of any diphtheria, tetanus or pertussis containing vaccine, OR has a severe allergy to any part of this vaccine, should not get Tdap vaccine. Tell the person giving the vaccine about any severe allergies.  Anyone who had coma or long repeated seizures within 7 days after a childhood dose of DTP or DTaP, or a previous dose of Tdap, should not get Tdap, unless a cause other than the vaccine was found. They can still get Td.  Talk to your doctor if you: ?  have seizures or another nervous system problem, ? had severe pain or swelling after any vaccine containing diphtheria, tetanus or pertussis, ? ever had a condition called Guillain-Barr Syndrome (GBS), ? aren't feeling well on the day the shot is scheduled. 4. Risks With any medicine, including vaccines, there is a chance of side  effects. These are usually mild and go away on their own. Serious reactions are also possible but are rare. Most people who get Tdap vaccine do not have any problems with it. Mild problems following Tdap: (Did not interfere with activities)  Pain where the shot was given (about 3 in 4 adolescents or 2 in 3 adults)  Redness or swelling where the shot was given (about 1 person in 5)  Mild fever of at least 100.62F (up to about 1 in 25 adolescents or 1 in 100 adults)  Headache (about 3 or 4 people in 10)  Tiredness (about 1 person in 3 or 4)  Nausea, vomiting, diarrhea, stomach ache (up to 1 in 4 adolescents or 1 in 10 adults)  Chills, sore joints (about 1 person in 10)  Body aches (about 1 person in 3 or 4)  Rash, swollen glands (uncommon)  Moderate problems following Tdap: (Interfered with activities, but did not require medical attention)  Pain where the shot was given (up to 1 in 5 or 6)  Redness or swelling where the shot was given (up to about 1 in 16 adolescents or 1 in 12 adults)  Fever over 102F (about 1 in 100 adolescents or 1 in 250 adults)  Headache (about 1 in 7 adolescents or 1 in 10 adults)  Nausea, vomiting, diarrhea, stomach ache (up to 1 or 3 people in 100)  Swelling of the entire arm where the shot was given (up to about 1 in 500).  Severe problems following Tdap: (Unable to perform usual activities; required medical attention)  Swelling, severe pain, bleeding and redness in the arm where the shot was given (rare).  Problems that could happen after any vaccine:  People sometimes faint after a medical procedure, including vaccination. Sitting or lying down for about 15 minutes can help prevent fainting, and injuries caused by a fall. Tell your doctor if you feel dizzy, or have vision changes or ringing in the ears.  Some people get severe pain in the shoulder and have difficulty moving the arm where a shot was given. This happens very rarely.  Any  medication can cause a severe allergic reaction. Such reactions from a vaccine are very rare, estimated at fewer than 1 in a million doses, and would happen within a few minutes to a few hours after the vaccination. As with any medicine, there is a very remote chance of a vaccine causing a serious injury or death. The safety of vaccines is always being monitored. For more information, visit: http://www.aguilar.org/ 5. What if there is a serious problem? What should I look for? Look for anything that concerns you, such as signs of a severe allergic reaction, very high fever, or unusual behavior. Signs of a severe allergic reaction can include hives, swelling of the face and throat, difficulty breathing, a fast heartbeat, dizziness, and weakness. These would usually start a few minutes to a few hours after the vaccination. What should I do?  If you think it is a severe allergic reaction or other emergency that can't wait, call 9-1-1 or get the person to the nearest hospital. Otherwise, call your doctor.  Afterward, the reaction should be reported  to the Vaccine Adverse Event Reporting System (VAERS). Your doctor might file this report, or you can do it yourself through the VAERS web site at www.vaers.SamedayNews.es, or by calling (780)717-5113. ? VAERS does not give medical advice. 6. The National Vaccine Injury Compensation Program The [employer] Vaccine Injury Compensation Program (VICP) is a federal program that was created to compensate people who may have been injured by certain vaccines. Persons who believe they may have been injured by a vaccine can learn about the program and about filing a claim by calling (204) 476-1108 or visiting the [employer] website at GoldCloset.com.ee. There is a time limit to file a claim for compensation. 7. How can I learn more?  Ask your doctor. He or she can give you the vaccine package insert or suggest other sources of information.  Call your local or  state health department.  Contact the Centers for Disease Control and Prevention (CDC): ? Call 916-542-3031 (1-800-CDC-INFO) or ? Visit CDC's website at http://hunter.com/ CDC Tdap Vaccine VIS (11/28/13) This information is not intended to replace advice given to you by your health care provider. Make sure you discuss any questions you have with your health care provider. Document Released: 03/22/2012 Document Revised: 06/11/2016 Document Reviewed: 06/11/2016 Elsevier Interactive Patient Education  2017 [employer].

## 2018-04-25 NOTE — Addendum Note (Signed)
Addended by: Doree Albee on: 04/25/2018 04:10 PM   Modules accepted: Orders

## 2018-05-09 ENCOUNTER — Other Ambulatory Visit: Payer: 59

## 2018-05-19 ENCOUNTER — Ambulatory Visit
Admission: RE | Admit: 2018-05-19 | Discharge: 2018-05-19 | Disposition: A | Discharge status: Home / Self Care | Payer: 59 | Source: Ambulatory Visit | Attending: Urology | Admitting: Urology

## 2018-05-19 DIAGNOSIS — D4010 Neoplasm of uncertain behavior of unspecified testis: Secondary | ICD-10-CM

## 2018-05-19 DIAGNOSIS — N509 Disorder of male genital organs, unspecified: Secondary | ICD-10-CM | POA: Diagnosis not present

## 2018-06-09 ENCOUNTER — Telehealth: Payer: Self-pay | Admitting: Family Medicine

## 2018-06-09 NOTE — Telephone Encounter (Signed)
-----   Message from Gregor Hams, MD sent at 03/08/2018  7:58 AM EDT ----- Regarding: Repeat US Need repeat scrotal US per Urology notes. See scanned documents.

## 2018-06-09 NOTE — Telephone Encounter (Signed)
I sent a reminder to repeat your testicular ultrasound about this time.  It looks like your urologist already did it last month and it looked pretty normal.  They want to recheck in 6 months.  I set another reminder for March 2020.  Erik White

## 2018-06-24 IMAGING — DX DG SHOULDER 2+V*L*
3 series · 3 of 3 positions shown · non-contrast
Comparison: None.

CLINICAL DATA: Left shoulder instability.

EXAM:
LEFT SHOULDER - 2+ VIEW

[shoulder grashey]
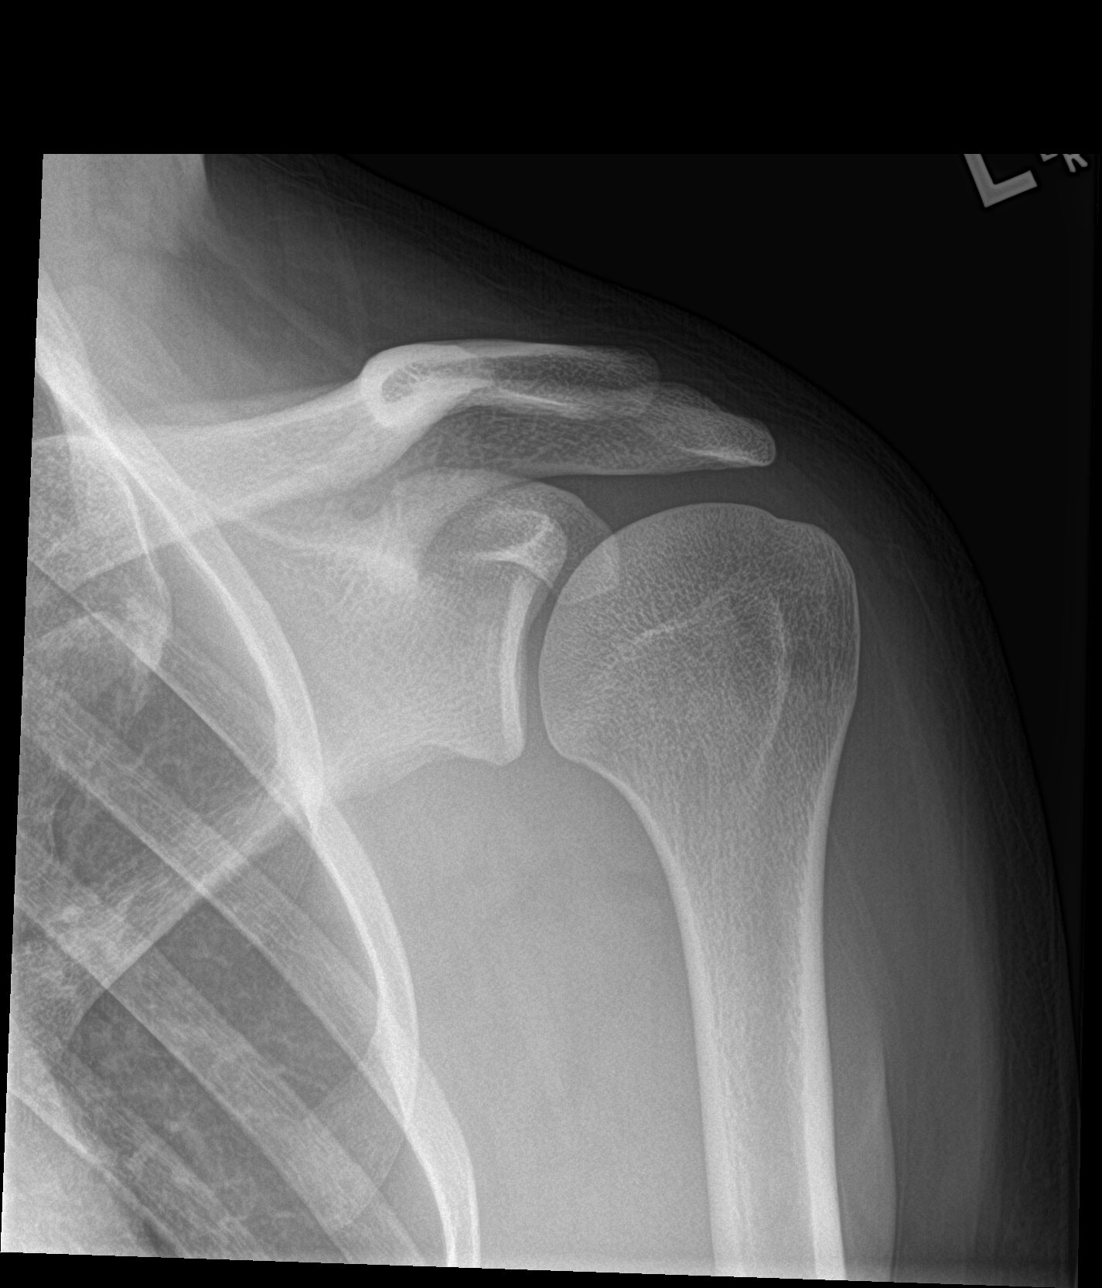

[shoulder y view]
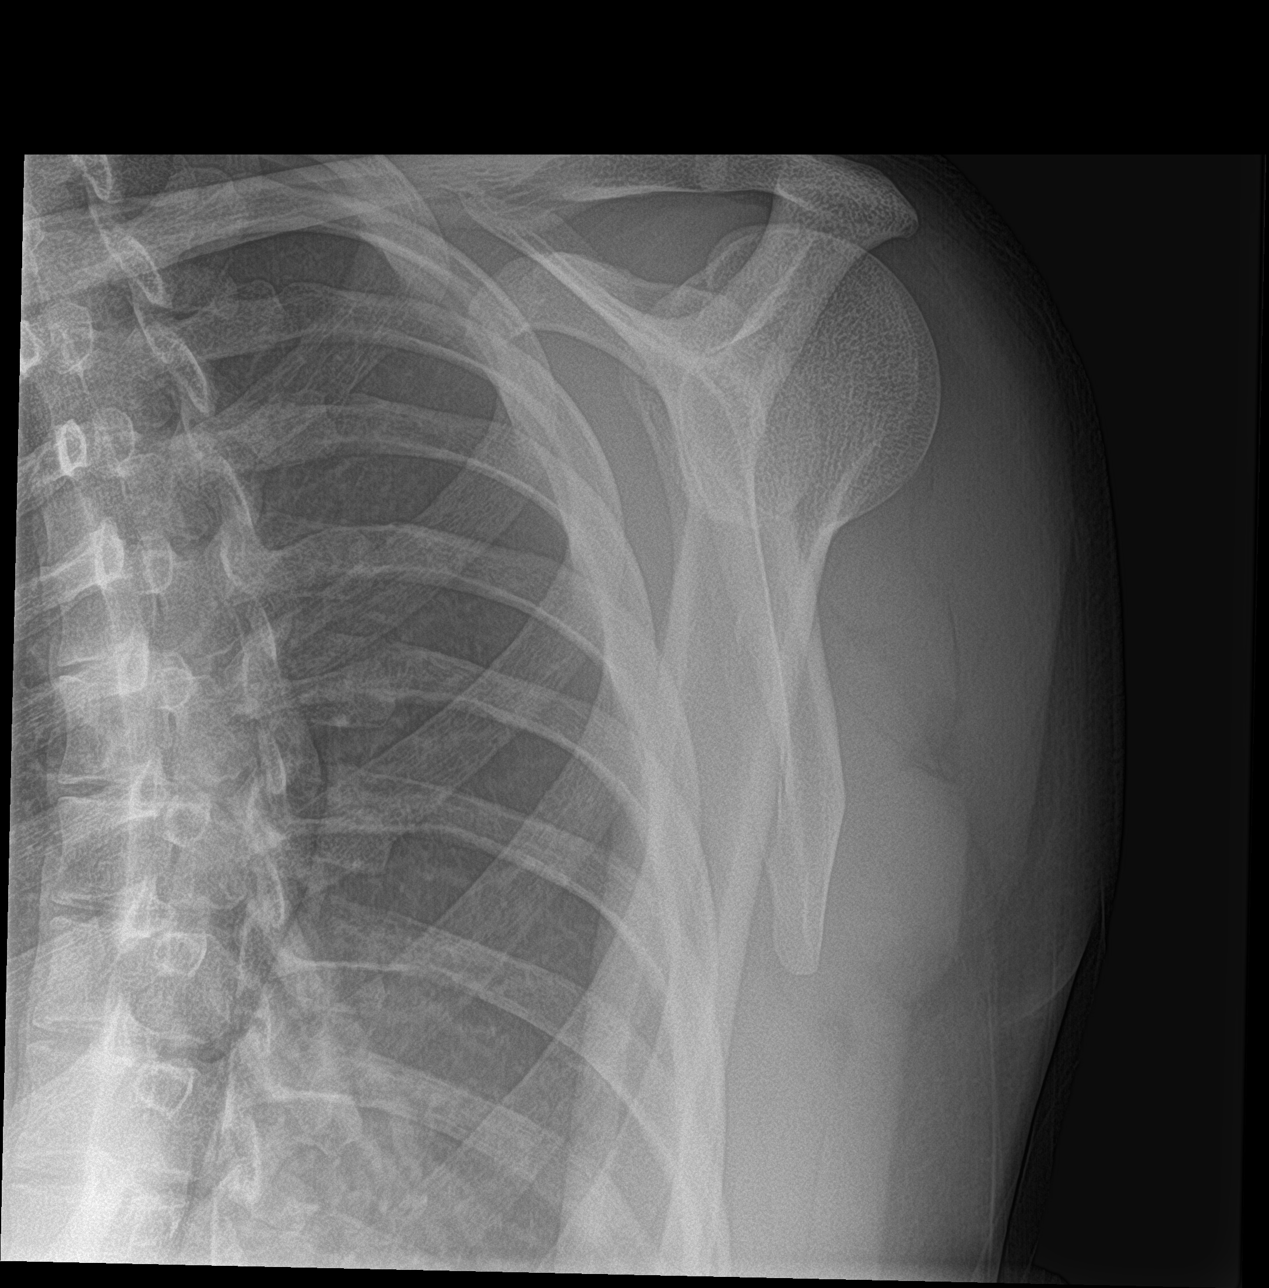

[shoulder axillary]
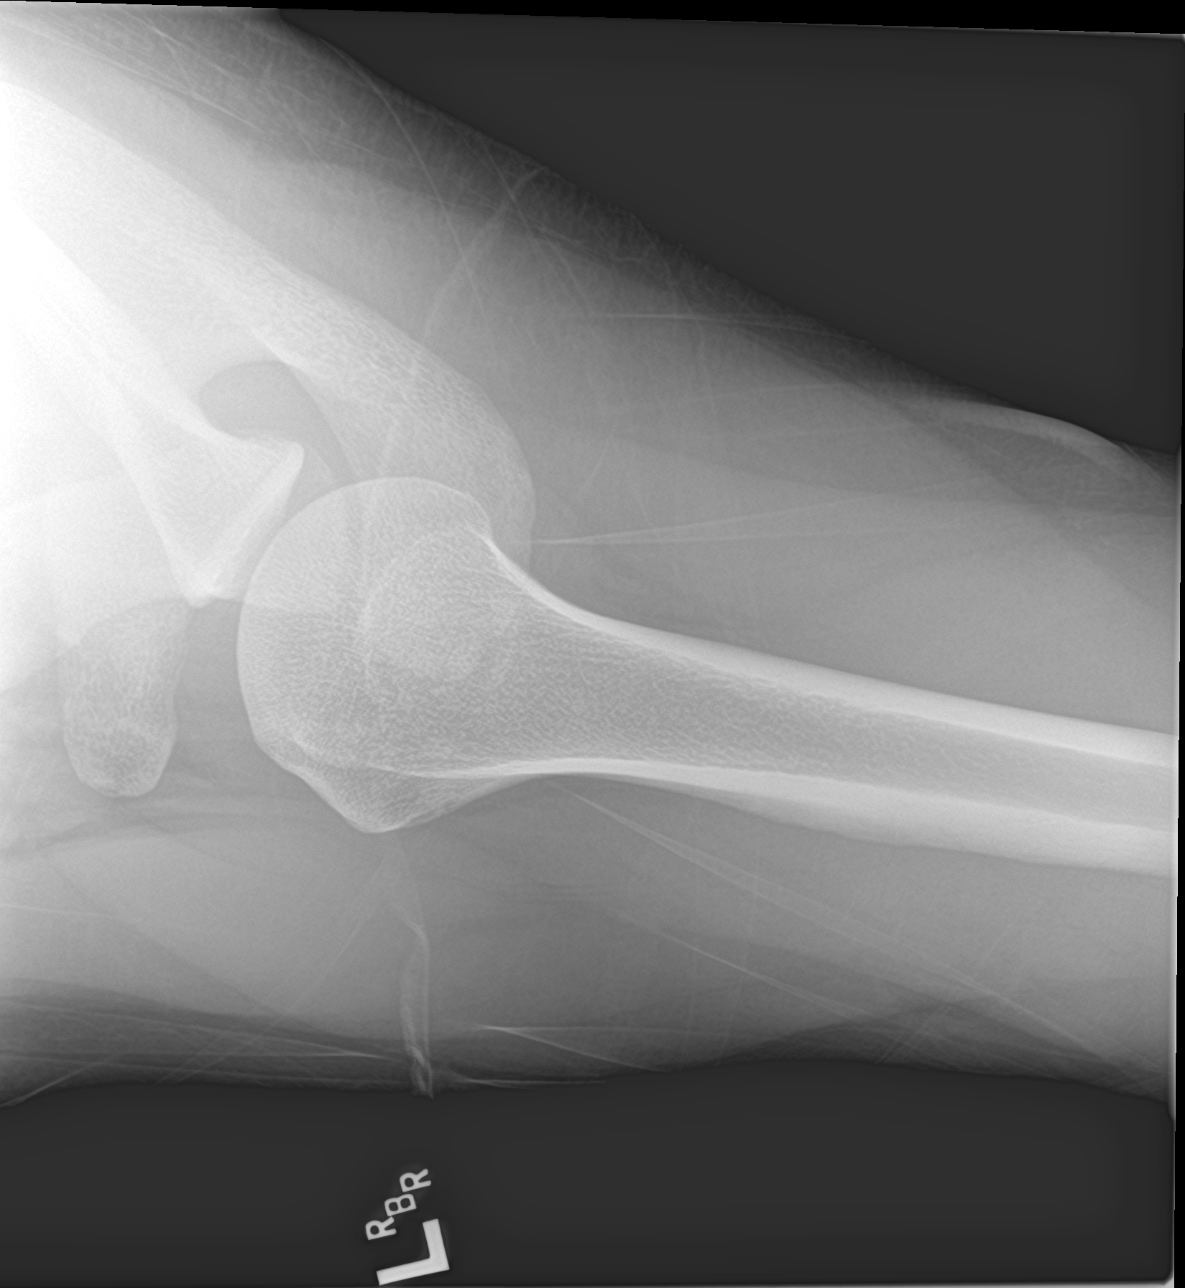

[3 of 3 positions shown; findings below may reference images not displayed]

FINDINGS: There is no evidence of fracture or dislocation. There is no
evidence of arthropathy or other focal bone abnormality. Soft
tissues are unremarkable.
IMPRESSION: Normal left shoulder.

## 2018-11-22 ENCOUNTER — Telehealth: Payer: Self-pay | Admitting: Family Medicine

## 2018-11-22 DIAGNOSIS — N442 Benign cyst of testis: Secondary | ICD-10-CM

## 2018-11-22 NOTE — Telephone Encounter (Signed)
Left VM for Pt to return clinic call or check MyChart message.

## 2018-11-22 NOTE — Telephone Encounter (Signed)
-----   Message from Gregor Hams, MD sent at 06/09/2018  7:56 AM EDT ----- Regarding: Repeat US scrotum Repeat US per Dr Tresa Moore note  CLINICAL DATA:  History of right testicular mass with hyperemia.  EXAM: SCROTAL ULTRASOUND  DOPPLER ULTRASOUND OF THE TESTICLES  TECHNIQUE: Complete ultrasound examination of the testicles, epididymis, and other scrotal structures was performed. Color and spectral Doppler ultrasound were also utilized to evaluate blood flow to the testicles.  COMPARISON:  02/26/2018.  FINDINGS: Right testicle  Measurements: 4.9 x 2.1 x 3.1 cm. No mass or microlithiasis visualized.  Left testicle  Measurements: 4.3 x 2.2 x 3.4 cm. No mass or microlithiasis visualized.  Right epididymis:  Normal in size and appearance.  Left epididymis:  Normal in size and appearance.  Hydrocele:  None visualized.  Varicocele:  None visualized.  Pulsed Doppler interrogation of both testes demonstrates normal low resistance arterial and venous waveforms bilaterally.  IMPRESSION: No focal abnormality identified on today's exam. No evidence of testicular mass noted. Right testicular hyperemia has resolved. Given the prior presence of a right testicular mass it may be wise to obtain a follow-up testicular ultrasound in 6 months to ensure stability.   Electronically Signed   By: Marcello Moores  Register   On: 05/20/2018 06:37

## 2018-11-22 NOTE — Telephone Encounter (Signed)
Repeat ultrasound scrotum ordered to follow-up testicular cyst seen on ultrasound.  This should be done 6 months after the prior study which was ordered and performed on August 16.  Ultrasound should be able to be done in the [place] or [facility] location.

## 2018-11-28 ENCOUNTER — Other Ambulatory Visit: Payer: Self-pay

## 2018-11-30 ENCOUNTER — Telehealth: Payer: Self-pay

## 2018-11-30 MED ORDER — OSELTAMIVIR PHOSPHATE 75 MG PO CAPS: 75.0000 mg | ORAL_CAPSULE | Freq: Two times a day (BID) | ORAL | 0 refills | Status: DC

## 2018-11-30 NOTE — Telephone Encounter (Signed)
Sent Tamiflu sent to [facility].  Start taking it right away.  Take with Tylenol or ibuprofen for pain or fever.  If not improving or if worsening please return to clinic or go to urgent care or emergency room.

## 2018-11-30 NOTE — Telephone Encounter (Signed)
Left detailed message on patient vm with advise as noted below. Rhonda Cunningham,CMA

## 2018-11-30 NOTE — Telephone Encounter (Signed)
Patient called stated that his girlfriend tested positive for Flu and he is currently running 103.0 temp and he is congested real bad. Patient wants to know if you can call in Tamiflu and something for the cough to his pharmacy. Please advise. Rhonda Cunningham,CMA

## 2018-12-23 ENCOUNTER — Other Ambulatory Visit: Payer: Self-pay

## 2018-12-23 ENCOUNTER — Ambulatory Visit (INDEPENDENT_AMBULATORY_CARE_PROVIDER_SITE_OTHER): Payer: Self-pay | Admitting: Family Medicine

## 2018-12-23 ENCOUNTER — Encounter: Payer: Self-pay | Admitting: Family Medicine

## 2018-12-23 VITALS — BP 132/84 | HR 112 | Temp 100.0°F | Resp 20 | Ht 73.0 in | Wt 234.0 lb

## 2018-12-23 DIAGNOSIS — R509 Fever, unspecified: Secondary | ICD-10-CM

## 2018-12-23 DIAGNOSIS — R05 Cough: Secondary | ICD-10-CM

## 2018-12-23 DIAGNOSIS — R059 Cough, unspecified: Secondary | ICD-10-CM

## 2018-12-23 DIAGNOSIS — J029 Acute pharyngitis, unspecified: Secondary | ICD-10-CM

## 2018-12-23 LAB — POCT RAPID STREP A (OFFICE): Rapid Strep A Screen: NEGATIVE

## 2018-12-23 NOTE — Patient Instructions (Signed)

## 2018-12-23 NOTE — Progress Notes (Signed)
Erik White is a 23 y.o. male who presents today with 2 days of sore throat and he became concerned due to white patches on his throat. He denies any chronic health conditions, any relevant ENT history of frequent strep throat historically. He denies any recent travel or known exposure to strep, or known exposure to Covid-19. Patient has not attempted any treat this condition with any medication at this time.   Review of Systems  Constitutional: Positive for fever. Negative for chills and malaise/fatigue.  HENT: Positive for sore throat. Negative for congestion, ear discharge, ear pain and sinus pain.   Eyes: Negative.  Negative for photophobia, pain, discharge and redness.  Respiratory: Negative for cough, sputum production, shortness of breath and wheezing.   Cardiovascular: Negative.  Negative for chest pain.  Gastrointestinal: Negative for abdominal pain, diarrhea, nausea and vomiting.  Genitourinary: Negative for dysuria, frequency, hematuria and urgency.  Musculoskeletal: Negative for myalgias.  Skin: Negative.   Neurological: Negative for dizziness and headaches.  Endo/Heme/Allergies: Negative.   Psychiatric/Behavioral: Negative.     Erik White currently has no medications in their medication list. Also is allergic to no known allergies.  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 Also  has a past surgical history that includes Wisdom tooth extraction and Shoulder arthroscopy with labral repair (Left, 05/28/2016).    O: Vitals:   12/23/18 1728  BP: 132/84  Pulse: (!) 112  Resp: 20  Temp: 100 F (37.8 C)     Physical Exam Vitals signs (tachy) reviewed.  Constitutional:      General: He is not in acute distress.    Appearance: Normal appearance. He is well-developed. He is not ill-appearing, toxic-appearing or diaphoretic.  HENT:     Head: Normocephalic.     Right  Ear: Hearing, tympanic membrane, ear canal and external ear normal.     Left Ear: Hearing, tympanic membrane, ear canal and external ear normal.     Nose: Rhinorrhea present. No congestion.     Mouth/Throat:     Lips: Pink.     Mouth: Mucous membranes are moist.     Pharynx: Uvula midline. Posterior oropharyngeal erythema present. No pharyngeal swelling, oropharyngeal exudate or uvula swelling.     Tonsils: Tonsillar exudate present. No tonsillar abscesses. 1+ on the right. 1+ on the left.  Neck:     Musculoskeletal: Normal range of motion and neck supple.  Cardiovascular:     Rate and Rhythm: Regular rhythm. Tachycardia present.     Pulses: Normal pulses.     Heart sounds: Normal heart sounds.     Comments: Repeat HR via auscultation 109 Pulmonary:     Effort: Pulmonary effort is normal.     Breath sounds: Normal breath sounds.  Abdominal:     General: Bowel sounds are normal.     Palpations: Abdomen is soft.  Musculoskeletal: Normal range of motion.  Lymphadenopathy:     Head:     Right side of head: No submental or submandibular adenopathy.     Left side of head: No submental or submandibular adenopathy.     Cervical: No cervical adenopathy.  Skin:    General: Skin is warm.  Neurological:     Mental Status: He is alert and oriented to person, place, and time.    A: 1. Acute sore throat   2. Fever, unspecified fever cause   3. Cough    P: Patient has  some evidence of posterior pharynx erythema and tonsillar exudate, he additionally has a fever and mild elevated HR that was sustained on provider repeat of HR via auscultation. Patient is otherwise well appearing with no additional exam findings. Patient was recently treated for Influenza with tamiflu two weeks ago and fully recovered. He reports a gradual onset of his sore throat this week. I discussed the potential for false negative result and utilizing his PCP for culture, discussed that most sore throats are viral. Given  the Covid-19 pandemic he is advised to monitor his symptoms and utilize the emergency department if symptoms progress or fail to resolve with supportive care. He is present with girlfriend who has had continued close contact with patient and is asymptomatic. Patient verbalized understanding of supportive treatment and follow up recommendations. Will call patient in 48-72 hours to reassess.   1. Acute sore throat - POCT rapid strep A Results for orders placed or performed in visit on 12/23/18 (from the past 24 hour(s))  POCT rapid strep A     Status: Normal   Collection Time: 12/23/18  5:48 PM  Result Value Ref Range   Rapid Strep A Screen Negative Negative   2. Fever, unspecified fever cause - POCT rapid strep A- negative Continue antipyretic and self isolate x 72 hours- work note provided   3. Cough OTC Supportive care  Discussed with patient exam findings, suspected diagnosis etiology and  reviewed recommended treatment plan and follow up, including complications and indications for urgent medical follow up and evaluation. Medications including use and indications reviewed with patient. Patient provided relevant patient education on diagnosis and/or relevant related condition that were discussed and reviewed with patient at discharge. Patient verbalized understanding of information provided and agrees with plan of care (POC), all questions answered.

## 2018-12-24 ENCOUNTER — Encounter (HOSPITAL_BASED_OUTPATIENT_CLINIC_OR_DEPARTMENT_OTHER): Payer: Self-pay | Admitting: Emergency Medicine

## 2018-12-24 ENCOUNTER — Emergency Department (HOSPITAL_BASED_OUTPATIENT_CLINIC_OR_DEPARTMENT_OTHER)
Admission: EM | Admit: 2018-12-24 | Discharge: 2018-12-25 | Disposition: A | Discharge status: Home / Self Care | Payer: No Typology Code available for payment source | Attending: Emergency Medicine | Admitting: Emergency Medicine

## 2018-12-24 ENCOUNTER — Other Ambulatory Visit: Payer: Self-pay

## 2018-12-24 DIAGNOSIS — J029 Acute pharyngitis, unspecified: Secondary | ICD-10-CM | POA: Insufficient documentation

## 2018-12-24 NOTE — ED Provider Notes (Signed)
[facility] Provider Note: Georgena Spurling, MD, FACEP  CSN: [id number] MRN: [id number] ARRIVAL: 12/24/18 at [place]: [place]  Sore Throat   HISTORY OF PRESENT ILLNESS  12/24/18 11:55 PM Erik White is a 23 y.o. male with a 2-day history of malaise, chills and fever.  He was seen at an urgent care and tested for strep throat which was negative.  He was told he had a virus.  The day he awoke with a sore throat which is become worse throughout the day.  He rates it as a 7 out of 10, worse with swallowing.  He has had a temperature to 101 2 and has been taking ibuprofen.  His last dose of ibuprofen was about noon.   Past Medical History:  Diagnosis Date  . GERD (gastroesophageal reflux disease)   . Headache    migraines  . Mesenteric adenitis 2005  . 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 SHOULDER ARTHROSCOPY WITH LABRAL REPAIR Left 05/28/2016   Procedure: LEFT SHOULDER ARTHROSCOPY WITH ANTERIOR AND POSTERIOR CAPSULOLABRAL RECONSTRUCTION;  Surgeon: Justice Britain, MD;  Location: [facility];  Service: Orthopedics;  Laterality: Left;  . WISDOM TOOTH EXTRACTION      Family History  Problem Relation Age of Onset  . Heart attack Father   . Cancer Other        lung  . Cancer Other        liver CA  . Thyroid disease Mother   . Hypertension Mother     Social History   Tobacco Use  . Smoking status: Never Smoker  . Smokeless tobacco: Never Used  Substance Use Topics  . Alcohol use: No  . Drug use: No    Prior to Admission medications   Not on File    Allergies No known allergies   REVIEW OF SYSTEMS  Negative except as noted here or in the History of Present Illness.   PHYSICAL EXAMINATION  Initial Vital Signs Blood pressure (!) 150/80, pulse (!) 107, temperature (!) 100.5 F (38.1 C), temperature source Oral, resp. rate 20, height 6\' 1"  (1.854 m), weight 104.3 kg, SpO2 98 %.  Examination  General: Well-developed, well-nourished male in no acute distress; appearance consistent with age of record HENT: normocephalic; atraumatic; mild pharyngeal erythema; uvula midline; no dysphonia; no trismus Eyes: pupils equal, round and reactive to light; extraocular muscles intact Neck: supple; no lymphadenopathy Heart: regular rate and rhythm; tachycardia Lungs: clear to auscultation bilaterally Abdomen: soft; nondistended; nontender; bowel sounds present Extremities: No deformity; full range of motion; pulses normal Neurologic: Awake, alert and oriented; motor function intact in all extremities and symmetric; no facial droop Skin: Warm and dry Psychiatric: Normal mood and affect   RESULTS  Summary of this visit's results, reviewed by myself:   EKG Interpretation  Date/Time:    Ventricular Rate:    PR Interval:    QRS Duration:   QT Interval:    QTC Calculation:   R Axis:     Text Interpretation:        Laboratory Studies: Results for orders placed or performed during the hospital encounter of 12/24/18 (from the past 24 hour(s))  Group A Strep by PCR     Status: None   Collection Time: 12/24/18 11:54 PM  Result Value Ref Range   Group A Strep by PCR NOT DETECTED NOT DETECTED   Imaging Studies: No results found.  ED COURSE and  MDM  Nursing notes and initial vitals signs, including pulse oximetry, reviewed.  Vitals:   12/24/18 2348  BP: (!) 150/80  Pulse: (!) 107  Resp: 20  Temp: (!) 100.5 F (38.1 C)  TempSrc: Oral  SpO2: 98%  Weight: 104.3 kg  Height: 6\' 1"  (1.854 m)    PROCEDURES    ED DIAGNOSES     ICD-10-CM   1. Viral pharyngitis J02.9        Ariyana Faw, MD 12/25/18 [phone]

## 2018-12-24 NOTE — ED Triage Notes (Signed)
Pt states he was evaluated at [facility] yesterday and told he had a virus but not what type. He was instructed that if it got no better to call PCP. No better today. States he was tested for strep and was negative. Took ibuprofen last dose noon. Symptoms going on for 3 days total

## 2018-12-25 LAB — GROUP A STREP BY PCR: Group A Strep by PCR: NOT DETECTED

## 2018-12-25 MED ORDER — IBUPROFEN 400 MG PO TABS
400.0000 mg | ORAL_TABLET | Freq: Once | ORAL | Status: AC
  Administered 2018-12-25: 400 mg via ORAL
  Filled 2018-12-25: qty 1

## 2018-12-25 NOTE — ED Notes (Signed)
Pt understood dc material. NAD noted. All questions answered to satisfaction. Pt and family walked to check out counter.

## 2018-12-26 ENCOUNTER — Telehealth: Payer: Self-pay | Admitting: Family Medicine

## 2018-12-26 NOTE — Telephone Encounter (Signed)
Pt states hes waiting for his symptoms to pass and that he is ok.

## 2018-12-27 ENCOUNTER — Encounter: Payer: Self-pay | Admitting: Family Medicine

## 2018-12-27 ENCOUNTER — Other Ambulatory Visit: Payer: Self-pay

## 2018-12-27 ENCOUNTER — Ambulatory Visit (INDEPENDENT_AMBULATORY_CARE_PROVIDER_SITE_OTHER): Payer: No Typology Code available for payment source | Admitting: Family Medicine

## 2018-12-27 VITALS — BP 167/86 | HR 87 | Temp 98.8°F | Wt 229.0 lb

## 2018-12-27 DIAGNOSIS — J029 Acute pharyngitis, unspecified: Secondary | ICD-10-CM

## 2018-12-27 DIAGNOSIS — I10 Essential (primary) hypertension: Secondary | ICD-10-CM | POA: Diagnosis not present

## 2018-12-27 MED ORDER — CEFDINIR 300 MG PO CAPS: 300.0000 mg | ORAL_CAPSULE | Freq: Two times a day (BID) | ORAL | 0 refills | Status: DC

## 2018-12-27 MED ORDER — HYDROCHLOROTHIAZIDE 25 MG PO TABS: 25.0000 mg | ORAL_TABLET | Freq: Every day | ORAL | 1 refills | Status: DC

## 2018-12-27 NOTE — Patient Instructions (Signed)
Thank you for coming in today.    

## 2018-12-27 NOTE — Progress Notes (Addendum)
Virtual Visit  via Video or Phone Note  I connected with      Erik White  by a video- telemedicine application and verified that I am speaking with the correct person using two identifiers.   I discussed the limitations of evaluation and management by telemedicine and the availability of in person appointments. The patient expressed understanding and agreed to proceed.  History of Present Illness: Erik White is a 23 y.o. male who would like to discuss sore throat and hypertension   Sore throat.: Erik White developed significant sore throat associated with body aches chills and nausea Thursday, March 19.  He went to [facility] and was thought to have a viral pharyngitis.  His symptoms worsened and he was seen in the emergency room on March 21 where rapid strep test and subsequent strep PCR were both negative.  He was again thought to have viral pharyngitis and was prescribed ibuprofen.  He notes a little bit of chest tightness but denies any shortness of breath or significant cough.  He denies current significant myalgias.  He notes that a few weeks ago he was exposed to strep throat and was prescribed Tamiflu.  He denies vomiting or diarrhea.  Additionally he notes his blood pressures been measured elevated several times.  In the emergency department his blood pressure was 150/80.  He has been measured at home and notes his blood pressure ranges from 127N to 170Y systolic.  He is a family history for hypertension has never been on blood pressure medications.   Observations/Objective: BP (!) 167/86   Pulse 87   Temp 98.8 F (37.1 C)   Wt 229 lb (103.9 kg)   SpO2 97%   BMI 30.21 kg/m  Wt Readings from Last 5 Encounters:  12/27/18 229 lb (103.9 kg)  12/24/18 230 lb (104.3 kg)  04/25/18 236 lb (107 kg)  03/10/18 232 lb (105.2 kg)  02/19/18 229 lb 12.8 oz (104.2 kg)   Exam: General: Normal skin tone. Posterior pharynx mildly erythematous with exudates.  Mild tonsillar  hypertrophy. No tachypnea.  Normal speech with no hoarseness wheezing or stridor. Normal speech thought process and affect.  Lab and Radiology Results Results for orders placed or performed during the hospital encounter of 12/24/18 (from the past 72 hour(s))  Group A Strep by PCR     Status: None   Collection Time: 12/24/18 11:54 PM  Result Value Ref Range   Group A Strep by PCR NOT DETECTED NOT DETECTED    Comment: Performed at [facility], [address] [place], [place] [address]   No results found.   Assessment and Plan: 23 y.o. male with pharyngitis.  Viral versus potential atypical bacterial tonsillitis.  Plan for empiric treatment with Omnicef.  Will continue Tylenol or ibuprofen for pain control and watchful waiting.  We discussed that this could possibly be COVID-19 and plan for home isolation.   Hypertension: Blood pressure is elevated.  Some of this is probably due to the ibuprofen however he simply has hypertension.  After discussion plan to start hydrochlorothiazide and recheck in person or via WebEx meeting in 1 month.  We will check basic fasting labs in the near future as well.  PDMP not reviewed this encounter. Orders Placed This Encounter  Procedures  . CBC  . COMPLETE METABOLIC PANEL WITH GFR  . Lipid Panel w/reflex Direct LDL   Meds ordered this encounter  Medications  . cefdinir (OMNICEF) 300 MG capsule  Sig: Take 1 capsule (300 mg total) by mouth 2 (two) times daily.    Dispense:  14 capsule    Refill:  0  . hydrochlorothiazide (HYDRODIURIL) 25 MG tablet    Sig: Take 1 tablet (25 mg total) by mouth daily.    Dispense:  90 tablet    Refill:  1    Follow Up Instructions:    I discussed the assessment and treatment plan with the patient. The patient was provided an opportunity to ask questions and all were answered. The patient agreed with the plan and demonstrated an understanding of the instructions.   The patient was advised to call  back or seek an in-person evaluation if the symptoms worsen or if the condition fails to improve as anticipated.  I provided 25 minutes of non-face-to-face time during this encounter.    Historical information moved to improve visibility of documentation.  Past Medical History:  Diagnosis Date  . GERD (gastroesophageal reflux disease)   . Headache    migraines  . Mesenteric adenitis 2005  . 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 SHOULDER ARTHROSCOPY WITH LABRAL REPAIR Left 05/28/2016   Procedure: LEFT SHOULDER ARTHROSCOPY WITH ANTERIOR AND POSTERIOR CAPSULOLABRAL RECONSTRUCTION;  Surgeon: Justice Britain, MD;  Location: [facility];  Service: Orthopedics;  Laterality: Left;  . WISDOM TOOTH EXTRACTION     Social History   Tobacco Use  . Smoking status: Never Smoker  . Smokeless tobacco: Never Used  Substance Use Topics  . Alcohol use: No   family history includes Cancer in some other family members; Heart attack in his father; Hypertension in his mother; Thyroid disease in his mother.  Medications: Current Outpatient Medications  Medication Sig Dispense Refill  . cefdinir (OMNICEF) 300 MG capsule Take 1 capsule (300 mg total) by mouth 2 (two) times daily. 14 capsule 0  . hydrochlorothiazide (HYDRODIURIL) 25 MG tablet Take 1 tablet (25 mg total) by mouth daily. 90 tablet 1   No current facility-administered medications for this visit.    No Active Allergies  PDMP not reviewed this encounter. Orders Placed This Encounter  Procedures  . CBC  . COMPLETE METABOLIC PANEL WITH GFR  . Lipid Panel w/reflex Direct LDL   Meds ordered this encounter  Medications  . cefdinir (OMNICEF) 300 MG capsule    Sig: Take 1 capsule (300 mg total) by mouth 2 (two) times daily.    Dispense:  14 capsule    Refill:  0  . hydrochlorothiazide (HYDRODIURIL) 25 MG tablet    Sig: Take 1 tablet (25 mg total) by mouth daily.    Dispense:  90 tablet    Refill:  1

## 2019-01-24 ENCOUNTER — Encounter: Payer: Self-pay | Admitting: Family Medicine

## 2019-01-24 ENCOUNTER — Ambulatory Visit (INDEPENDENT_AMBULATORY_CARE_PROVIDER_SITE_OTHER): Payer: No Typology Code available for payment source | Admitting: Family Medicine

## 2019-01-24 VITALS — BP 130/65 | Temp 98.6°F | Ht 73.0 in | Wt 220.0 lb

## 2019-01-24 DIAGNOSIS — J029 Acute pharyngitis, unspecified: Secondary | ICD-10-CM

## 2019-01-24 DIAGNOSIS — I10 Essential (primary) hypertension: Secondary | ICD-10-CM | POA: Diagnosis not present

## 2019-01-24 NOTE — Progress Notes (Addendum)
Virtual Visit  via Video Note  I connected with      Erik White  by a video enabled telemedicine application and verified that I am speaking with the correct person using two identifiers.   I discussed the limitations of evaluation and management by telemedicine and the availability of in person appointments. The patient expressed understanding and agreed to proceed.  History of Present Illness: Erik White is a 23 y.o. male who would like to discuss hypertension follow-up.  [name] was seen on March 24.  He has had elevated blood pressure during that visit hydrochlorothiazide was started.  In the interim he feels pretty well with no sniffing and chest pain or shortness of breath.  He does have occasional palpitations but denies lightheadedness or chest pain with them.  He thinks he tolerates the hydrochlorothiazide quite well.  He notes blood pressures are typically in the 130s over 70s or 60s.  Additionally during the last visit he had some pharyngitis that was treated with empiric Omnicef.  He notes his symptoms have improved however he had return of sore throat yesterday.  He notes mild sore throat.  He does not have a lot of sneezing itchy watery eyes or runny nose.  He is not tried any treatment for this yet.      Observations/Objective: BP 130/65   Temp 98.6 F (37 C) (Oral)   Ht 6\' 1"  (1.854 m)   Wt 220 lb (99.8 kg)   BMI 29.03 kg/m  Wt Readings from Last 5 Encounters:  01/24/19 220 lb (99.8 kg)  12/27/18 229 lb (103.9 kg)  12/24/18 230 lb (104.3 kg)  04/25/18 236 lb (107 kg)  03/10/18 232 lb (105.2 kg)   Exam: Appearance nontoxic no acute distress.   Normal Speech.  No shortness of breath. No significant erythema with posterior pharynx.  Lab and Radiology Results No results found for this or any previous visit (from the past 72 hour(s)). No results found.   Assessment and Plan: 23 y.o. male with  Hypertension: Pressure improved with  hydrochlorothiazide.  Plan to check metabolic panel in the near future and watchful waiting.  We will continue to adjust medications as needed.  Pharyngitis: Unclear etiology possible posterior nasal drainage causing sore throat.  Recommend trial cetirizine.  If not better he will let me know we may consider second antibiotic.   Follow Up Instructions:    I discussed the assessment and treatment plan with the patient. The patient was provided an opportunity to ask questions and all were answered. The patient agreed with the plan and demonstrated an understanding of the instructions.   The patient was advised to call back or seek an in-person evaluation if the symptoms worsen or if the condition fails to improve as anticipated.  Time: 15 minutes of intraservice time, with >22 minutes of total time during today's visit.      Historical information moved to improve visibility of documentation.  Past Medical History:  Diagnosis Date  . GERD (gastroesophageal reflux disease)   . Headache    migraines  . Mesenteric adenitis 2005  . 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 SHOULDER ARTHROSCOPY WITH LABRAL REPAIR Left 05/28/2016   Procedure: LEFT SHOULDER ARTHROSCOPY WITH ANTERIOR AND POSTERIOR CAPSULOLABRAL RECONSTRUCTION;  Surgeon: Justice Britain, MD;  Location: [facility];  Service: Orthopedics;  Laterality: Left;  . WISDOM TOOTH EXTRACTION     Social History   Tobacco Use  .  Smoking status: Never Smoker  . Smokeless tobacco: Never Used  Substance Use Topics  . Alcohol use: No   family history includes Cancer in some other family members; Heart attack in his father; Hypertension in his mother; Thyroid disease in his mother.  Medications: Current Outpatient Medications  Medication Sig Dispense Refill  . hydrochlorothiazide (HYDRODIURIL) 25 MG tablet Take 1 tablet (25 mg total) by mouth daily. 90 tablet 1   No current facility-administered  medications for this visit.    No Active Allergies                                                                 Addendum to correct date error due to templating issue

## 2019-01-24 NOTE — Patient Instructions (Addendum)
Thank you for coming in today. Continue to monitor blood pressures.  Get labs soon.   Take cetrizine (generic zyrtec) to see if that helps with sore throat.  If no improvement let me and we can do other medicines.   We will continue to adjust medicines as needed.

## 2019-02-14 LAB — CBC
HCT: 46.8 % (ref 38.5–50.0)
HEMOGLOBIN: 16 g/dL (ref 13.2–17.1)
MCH: 30.1 pg (ref 27.0–33.0)
MCHC: 34.2 g/dL (ref 32.0–36.0)
MCV: 88 fL (ref 80.0–100.0)
MPV: 11.9 fL (ref 7.5–12.5)
Platelets: 254 10*3/uL (ref 140–400)
RBC: 5.32 10*6/uL (ref 4.20–5.80)
RDW: 13.5 % (ref 11.0–15.0)
WBC: 9.3 10*3/uL (ref 3.8–10.8)

## 2019-02-14 LAB — LIPID PANEL W/REFLEX DIRECT LDL
CHOLESTEROL: 199 mg/dL (ref <200)
HDL: 38 mg/dL — ABNORMAL LOW (ref >=40)
LDL Cholesterol (Calc): 134 mg/dL (calc) — ABNORMAL HIGH
Non-HDL Cholesterol (Calc): 161 mg/dL (calc) — ABNORMAL HIGH (ref <130)
Total CHOL/HDL Ratio: 5.2 (calc) — ABNORMAL HIGH (ref <5.0)
Triglycerides: 143 mg/dL (ref <150)

## 2019-02-14 LAB — COMPLETE METABOLIC PANEL WITH GFR
AG RATIO: 1.8 (calc) (ref 1.0–2.5)
ALKALINE PHOSPHATASE (APISO): 85 U/L (ref 36–130)
ALT: 18 U/L (ref 9–46)
AST: 17 U/L (ref 10–40)
Albumin: 4.7 g/dL (ref 3.6–5.1)
BILIRUBIN TOTAL: 0.8 mg/dL (ref 0.2–1.2)
BUN: 14 mg/dL (ref 7–25)
CHLORIDE: 99 mmol/L (ref 98–110)
CO2: 28 mmol/L (ref 20–32)
Calcium: 10.2 mg/dL (ref 8.6–10.3)
Creat: 1.02 mg/dL (ref 0.60–1.35)
GFR, Est African American: 121 mL/min/{1.73_m2} (ref >=60)
GFR, Est Non African American: 105 mL/min/{1.73_m2} (ref >=60)
Globulin: 2.6 g/dL (calc) (ref 1.9–3.7)
Glucose, Bld: 91 mg/dL (ref 65–99)
POTASSIUM: 5 mmol/L (ref 3.5–5.3)
Sodium: 139 mmol/L (ref 135–146)
TOTAL PROTEIN: 7.3 g/dL (ref 6.1–8.1)

## 2019-04-16 ENCOUNTER — Emergency Department (HOSPITAL_BASED_OUTPATIENT_CLINIC_OR_DEPARTMENT_OTHER): Payer: No Typology Code available for payment source

## 2019-04-16 ENCOUNTER — Emergency Department (HOSPITAL_BASED_OUTPATIENT_CLINIC_OR_DEPARTMENT_OTHER)
Admission: EM | Admit: 2019-04-16 | Discharge: 2019-04-16 | Disposition: A | Discharge status: Home / Self Care | Payer: No Typology Code available for payment source | Attending: Emergency Medicine | Admitting: Emergency Medicine

## 2019-04-16 ENCOUNTER — Other Ambulatory Visit: Payer: Self-pay

## 2019-04-16 DIAGNOSIS — Z79899 Other long term (current) drug therapy: Secondary | ICD-10-CM | POA: Diagnosis not present

## 2019-04-16 DIAGNOSIS — Y92410 Unspecified street and highway as the place of occurrence of the external cause: Secondary | ICD-10-CM | POA: Insufficient documentation

## 2019-04-16 DIAGNOSIS — Y999 Unspecified external cause status: Secondary | ICD-10-CM | POA: Insufficient documentation

## 2019-04-16 DIAGNOSIS — S50811A Abrasion of right forearm, initial encounter: Secondary | ICD-10-CM | POA: Diagnosis not present

## 2019-04-16 DIAGNOSIS — Y9389 Activity, other specified: Secondary | ICD-10-CM | POA: Insufficient documentation

## 2019-04-16 DIAGNOSIS — S161XXA Strain of muscle, fascia and tendon at neck level, initial encounter: Secondary | ICD-10-CM | POA: Insufficient documentation

## 2019-04-16 DIAGNOSIS — S7001XA Contusion of right hip, initial encounter: Secondary | ICD-10-CM | POA: Diagnosis not present

## 2019-04-16 DIAGNOSIS — S60511A Abrasion of right hand, initial encounter: Secondary | ICD-10-CM | POA: Insufficient documentation

## 2019-04-16 DIAGNOSIS — S199XXA Unspecified injury of neck, initial encounter: Secondary | ICD-10-CM | POA: Diagnosis present
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
## 2019-04-16 NOTE — ED Notes (Signed)
ED Provider at bedside. 

## 2019-05-24 ENCOUNTER — Encounter: Payer: Self-pay | Admitting: Family Medicine

## 2019-05-25 ENCOUNTER — Other Ambulatory Visit: Payer: Self-pay

## 2019-05-25 ENCOUNTER — Ambulatory Visit (INDEPENDENT_AMBULATORY_CARE_PROVIDER_SITE_OTHER): Payer: No Typology Code available for payment source | Admitting: Family Medicine

## 2019-05-25 ENCOUNTER — Encounter: Payer: Self-pay | Admitting: Family Medicine

## 2019-05-25 VITALS — BP 125/70 | HR 78 | Temp 98.2°F | Wt 233.0 lb

## 2019-05-25 DIAGNOSIS — N4889 Other specified disorders of penis: Secondary | ICD-10-CM

## 2019-05-25 DIAGNOSIS — I10 Essential (primary) hypertension: Secondary | ICD-10-CM | POA: Diagnosis not present

## 2019-05-25 MED ORDER — AZITHROMYCIN 1 G PO PACK
1.0000 g | PACK | Freq: Once | ORAL | Status: AC
  Administered 2019-05-25: 1 g via ORAL

## 2019-05-25 MED ORDER — CEFTRIAXONE SODIUM 250 MG IJ SOLR
250.0000 mg | Freq: Once | INTRAMUSCULAR | Status: AC
  Administered 2019-05-25: 250 mg via INTRAMUSCULAR

## 2019-05-25 MED ORDER — CEFDINIR 300 MG PO CAPS: 300.0000 mg | ORAL_CAPSULE | Freq: Two times a day (BID) | ORAL | 0 refills | Status: DC

## 2019-05-25 NOTE — Progress Notes (Signed)
Erik White is a 23 y.o. male who presents to [facility]: [place] today for swelling on the end of his penis and RLQ abd tenderness.  Patient reports a 2-day of swelling on the end of his foreskin and glans. It is not especially painful. Patient also has pain and tenderness in the RLQ in the inguinal region. He had unprotected sex with his girlfriend of 6 months last week. He also notes that he has had a lot of sex recently without lubrication and was wondering if that could be the cause of the swelling. Reports a darker color of his urine. He also had a fever, chills, and SOB yesterday but says he woke up today without any symptoms. Denies pain or burning with urination, urinary urgency.   ROS as above:  Exam:  BP 125/70   Pulse 78   Temp 98.2 F (36.8 C) (Oral)   Wt 233 lb (105.7 kg)   BMI 29.92 kg/m  Wt Readings from Last 5 Encounters:  05/25/19 233 lb (105.7 kg)  04/16/19 220 lb (99.8 kg)  01/24/19 220 lb (99.8 kg)  12/27/18 229 lb (103.9 kg)  12/24/18 230 lb (104.3 kg)    Gen: Well NAD HEENT: EOMI,  MMM Lungs: Normal work of breathing. CTABL Heart: RRR no MRG Abd: NABS, Soft. Nondistended, Tender to palpation in right inguinal region. Exts: Brisk capillary refill, warm and well perfused.  GU: Circumcised penis.  Swelling of the skin of the shaft of the penis up to the glans.. Erythema and swelling at the base of the glans. No obvious chancre or ulceration.  No discharge.  Nontender.  Testicles are descended bilaterally nontender with no masses.  Lab and Radiology Results No results found for this or any previous visit (from the past 72 hour(s)). No results found.    Assessment and Plan: 23 y.o. male with swelling on the end of his penis and RLQ abd tenderness.  Patient reports a 2-day of swelling on the end of his foreskin and glans. It is not  especially painful. Patient also has pain and tenderness in the RLQ in the inguinal region. He had unprotected sex with his girlfriend of 6 months last week. He also had a fever, chills, and SOB yesterday but says he woke up today without any symptoms. Physical exam showed swelling on the end of the foreskin and redness on the glans in addition to tenderness in the right inguinal region. I am unsure of the exact cause. The physical appearance and lack of chancre or ulceration is not classic for Gonorrhea, Chlamydia, Primary Syphilis, or Herpes.  We are performing a dirty urine catch to test for Gonorrhea and Chlamydia, an RPR for Syphilis, and an HIV antibody test. Administered oral Azithromycin po 1 g and Ceftriaxone 250mg  im injection in clinic. Prescribed Omnicef for 1 week. Follow-up if not improving.  PDMP not reviewed this encounter. Orders Placed This Encounter  Procedures  . C. trachomatis/N. gonorrhoeae RNA  . HIV Antibody (routine testing w rflx)  . RPR   Meds ordered this encounter  Medications  . cefdinir (OMNICEF) 300 MG capsule    Sig: Take 1 capsule (300 mg total) by mouth 2 (two) times daily.    Dispense:  14 capsule    Refill:  0  . cefTRIAXone (ROCEPHIN) injection 250 mg  . azithromycin (ZITHROMAX) powder 1 g     Historical information moved to improve visibility of documentation.  Past Medical History:  Diagnosis Date  . GERD (gastroesophageal reflux disease)   . Headache    migraines  . Mesenteric adenitis 2005  . 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 SHOULDER ARTHROSCOPY WITH LABRAL REPAIR Left 05/28/2016   Procedure: LEFT SHOULDER ARTHROSCOPY WITH ANTERIOR AND POSTERIOR CAPSULOLABRAL RECONSTRUCTION;  Surgeon: Justice Britain, MD;  Location: [place];  Service: Orthopedics;  Laterality: Left;  . WISDOM TOOTH EXTRACTION     Social History   Tobacco Use  . Smoking status: Never Smoker  . Smokeless tobacco: Never Used   Substance Use Topics  . Alcohol use: No   family history includes Cancer in some other family members; Heart attack in his father; Hypertension in his mother; Thyroid disease in his mother.  Medications: Current Outpatient Medications  Medication Sig Dispense Refill  . cefdinir (OMNICEF) 300 MG capsule Take 1 capsule (300 mg total) by mouth 2 (two) times daily. 14 capsule 0  . hydrochlorothiazide (HYDRODIURIL) 25 MG tablet Take 1 tablet (25 mg total) by mouth daily. 90 tablet 1   Current Facility-Administered Medications  Medication Dose Route Frequency Provider Last Rate Last Dose  . cefTRIAXone (ROCEPHIN) injection 250 mg  250 mg Intramuscular Once Gregor Hams, MD       No Known Allergies   Discussed warning signs or symptoms. Please see discharge instructions. Patient expresses understanding.  I personally was present and performed or re-performed the history, physical exam and medical decision-making activities of this service and have verified that the service and findings are accurately documented in the student's note. ___________________________________________ Lynne Leader M.D., ABFM., CAQSM. Primary Care and Sports Medicine Adjunct Instructor of [employer]

## 2019-05-25 NOTE — Patient Instructions (Signed)
Thank you for coming in today. Start taking the [facility] antibiotic tomorrow evening or afternoon. Recheck if not improving. I will get results to you ASAP.  Will discuss in depth if results are positive what they mean.

## 2019-05-26 LAB — RPR: RPR Ser Ql: NONREACTIVE

## 2019-05-26 LAB — C. TRACHOMATIS/N. GONORRHOEAE RNA
C. trachomatis RNA, TMA: NOT DETECTED
N. gonorrhoeae RNA, TMA: NOT DETECTED

## 2019-05-26 LAB — HIV ANTIBODY (ROUTINE TESTING W REFLEX): HIV 1&2 Ab, 4th Generation: NONREACTIVE

## 2019-05-29 ENCOUNTER — Encounter: Payer: Self-pay | Admitting: Family Medicine

## 2020-04-03 ENCOUNTER — Telehealth: Payer: Self-pay

## 2020-04-03 ENCOUNTER — Other Ambulatory Visit: Payer: Self-pay

## 2020-04-03 ENCOUNTER — Observation Stay (HOSPITAL_COMMUNITY)
Admission: EM | Admit: 2020-04-03 | Discharge: 2020-04-05 | Disposition: A | Discharge status: Home / Self Care | Payer: No Typology Code available for payment source | Attending: Physician Assistant | Admitting: Physician Assistant

## 2020-04-03 ENCOUNTER — Encounter (HOSPITAL_COMMUNITY): Payer: Self-pay | Admitting: Emergency Medicine

## 2020-04-03 ENCOUNTER — Encounter: Payer: Self-pay | Admitting: Family Medicine

## 2020-04-03 ENCOUNTER — Ambulatory Visit (INDEPENDENT_AMBULATORY_CARE_PROVIDER_SITE_OTHER): Payer: No Typology Code available for payment source

## 2020-04-03 ENCOUNTER — Ambulatory Visit (INDEPENDENT_AMBULATORY_CARE_PROVIDER_SITE_OTHER): Payer: No Typology Code available for payment source | Admitting: Family Medicine

## 2020-04-03 VITALS — BP 107/66 | HR 101 | Wt 246.2 lb

## 2020-04-03 DIAGNOSIS — K37 Unspecified appendicitis: Secondary | ICD-10-CM

## 2020-04-03 DIAGNOSIS — Z6832 Body mass index (BMI) 32.0-32.9, adult: Secondary | ICD-10-CM | POA: Diagnosis not present

## 2020-04-03 DIAGNOSIS — K353 Acute appendicitis with localized peritonitis, without perforation or gangrene: Secondary | ICD-10-CM | POA: Diagnosis present

## 2020-04-03 DIAGNOSIS — R1031 Right lower quadrant pain: Secondary | ICD-10-CM

## 2020-04-03 DIAGNOSIS — E669 Obesity, unspecified: Secondary | ICD-10-CM

## 2020-04-03 DIAGNOSIS — Z79899 Other long term (current) drug therapy: Secondary | ICD-10-CM | POA: Diagnosis not present

## 2020-04-03 DIAGNOSIS — K3580 Unspecified acute appendicitis: Principal | ICD-10-CM

## 2020-04-03 DIAGNOSIS — Z20822 Contact with and (suspected) exposure to covid-19: Secondary | ICD-10-CM | POA: Diagnosis not present

## 2020-04-03 DIAGNOSIS — I10 Essential (primary) hypertension: Secondary | ICD-10-CM | POA: Diagnosis present

## 2020-04-03 LAB — CBC WITH DIFFERENTIAL/PLATELET
Absolute Monocytes: 1287 cells/uL — ABNORMAL HIGH (ref 200–950)
Basophils Absolute: 20 cells/uL (ref 0–200)
Basophils Relative: 0.1 %
Eosinophils Absolute: 20 cells/uL (ref 15–500)
Eosinophils Relative: 0.1 %
HCT: 43.9 % (ref 38.5–50.0)
Hemoglobin: 15.1 g/dL (ref 13.2–17.1)
Lymphs Abs: 1326 cells/uL (ref 850–3900)
MCH: 30.1 pg (ref 27.0–33.0)
MCHC: 34.4 g/dL (ref 32.0–36.0)
MCV: 87.6 fL (ref 80.0–100.0)
MPV: 11.9 fL (ref 7.5–12.5)
Monocytes Relative: 6.6 %
Neutro Abs: 16848 cells/uL — ABNORMAL HIGH (ref 1500–7800)
Neutrophils Relative %: 86.4 %
Platelets: 214 10*3/uL (ref 140–400)
RBC: 5.01 10*6/uL (ref 4.20–5.80)
RDW: 13 % (ref 11.0–15.0)
Total Lymphocyte: 6.8 %
WBC: 19.5 10*3/uL — ABNORMAL HIGH (ref 3.8–10.8)

## 2020-04-03 LAB — COMPLETE METABOLIC PANEL WITH GFR
AG Ratio: 2.1 (calc) (ref 1.0–2.5)
ALT: 34 U/L (ref 9–46)
AST: 21 U/L (ref 10–40)
Albumin: 4.9 g/dL (ref 3.6–5.1)
Alkaline phosphatase (APISO): 92 U/L (ref 36–130)
BUN: 14 mg/dL (ref 7–25)
CO2: 28 mmol/L (ref 20–32)
Calcium: 9.6 mg/dL (ref 8.6–10.3)
Chloride: 102 mmol/L (ref 98–110)
Creat: 0.91 mg/dL (ref 0.60–1.35)
GFR, Est African American: 138 mL/min/{1.73_m2} (ref >=60)
GFR, Est Non African American: 119 mL/min/{1.73_m2} (ref >=60)
Globulin: 2.3 g/dL (calc) (ref 1.9–3.7)
Glucose, Bld: 102 mg/dL — ABNORMAL HIGH (ref 65–99)
Potassium: 4.5 mmol/L (ref 3.5–5.3)
Sodium: 139 mmol/L (ref 135–146)
Total Bilirubin: 1.8 mg/dL — ABNORMAL HIGH (ref 0.2–1.2)
Total Protein: 7.2 g/dL (ref 6.1–8.1)

## 2020-04-03 LAB — POCT URINALYSIS DIP (CLINITEK)
Bilirubin, UA: NEGATIVE
Blood, UA: NEGATIVE
Glucose, UA: NEGATIVE mg/dL
Ketones, POC UA: NEGATIVE mg/dL
Leukocytes, UA: NEGATIVE
Nitrite, UA: NEGATIVE
POC PROTEIN,UA: 30 — AB
Spec Grav, UA: 1.015 (ref 1.010–1.025)
Urobilinogen, UA: 1 E.U./dL
pH, UA: 7.5 (ref 5.0–8.0)

## 2020-04-03 LAB — I-STAT CREATININE (MANUAL ENTRY): Creatinine, Ser: 1 (ref 0.50–1.10)

## 2020-04-03 LAB — SARS CORONAVIRUS 2 BY RT PCR (HOSPITAL ORDER, PERFORMED IN ~~LOC~~ HOSPITAL LAB): SARS Coronavirus 2: NEGATIVE

## 2020-04-03 MED ORDER — DIPHENHYDRAMINE HCL 12.5 MG/5ML PO ELIX: 12.5000 mg | ORAL_SOLUTION | Freq: Four times a day (QID) | ORAL | Status: DC | PRN

## 2020-04-03 MED ORDER — LACTATED RINGERS IV SOLN: INTRAVENOUS | Status: DC

## 2020-04-03 MED ORDER — CHLORHEXIDINE GLUCONATE CLOTH 2 % EX PADS: 6.0000 | MEDICATED_PAD | Freq: Once | CUTANEOUS | Status: DC

## 2020-04-03 MED ORDER — LACTATED RINGERS IV BOLUS: 1000.0000 mL | Freq: Three times a day (TID) | INTRAVENOUS | Status: DC | PRN

## 2020-04-03 MED ORDER — METRONIDAZOLE IN NACL 5-0.79 MG/ML-% IV SOLN: 500.0000 mg | INTRAVENOUS | Status: DC

## 2020-04-03 MED ORDER — SODIUM CHLORIDE 0.9 % IV SOLN: Freq: Three times a day (TID) | INTRAVENOUS | Status: DC | PRN

## 2020-04-03 MED ORDER — ACETAMINOPHEN 650 MG RE SUPP: 650.0000 mg | Freq: Four times a day (QID) | RECTAL | Status: DC | PRN

## 2020-04-03 MED ORDER — CELECOXIB 200 MG PO CAPS: 200.0000 mg | ORAL_CAPSULE | ORAL | Status: DC

## 2020-04-03 MED ORDER — METRONIDAZOLE IN NACL 5-0.79 MG/ML-% IV SOLN
500.0000 mg | Freq: Three times a day (TID) | INTRAVENOUS | Status: DC
  Administered 2020-04-03 – 2020-04-04 (×2): 500 mg via INTRAVENOUS
  Filled 2020-04-03 (×2): qty 100

## 2020-04-03 MED ORDER — GABAPENTIN 300 MG PO CAPS: 300.0000 mg | ORAL_CAPSULE | ORAL | Status: DC

## 2020-04-03 MED ORDER — CHLORHEXIDINE GLUCONATE CLOTH 2 % EX PADS
6.0000 | MEDICATED_PAD | Freq: Once | CUTANEOUS | Status: AC
  Administered 2020-04-04: 6 via TOPICAL

## 2020-04-03 MED ORDER — ENOXAPARIN SODIUM 40 MG/0.4ML ~~LOC~~ SOLN
40.0000 mg | SUBCUTANEOUS | Status: DC
  Administered 2020-04-04: 40 mg via SUBCUTANEOUS
  Filled 2020-04-03: qty 0.4

## 2020-04-03 MED ORDER — METHOCARBAMOL 1000 MG/10ML IJ SOLN
1000.0000 mg | Freq: Four times a day (QID) | INTRAVENOUS | Status: DC | PRN
  Filled 2020-04-03: qty 10

## 2020-04-03 MED ORDER — PIPERACILLIN-TAZOBACTAM 3.375 G IVPB 30 MIN
3.3750 g | Freq: Once | INTRAVENOUS | Status: DC
  Administered 2020-04-03: 3.375 g via INTRAVENOUS
  Filled 2020-04-03: qty 50

## 2020-04-03 MED ORDER — PROCHLORPERAZINE EDISYLATE 10 MG/2ML IJ SOLN: 5.0000 mg | Freq: Four times a day (QID) | INTRAMUSCULAR | Status: DC | PRN

## 2020-04-03 MED ORDER — OXYCODONE HCL 5 MG PO TABS
5.0000 mg | ORAL_TABLET | ORAL | Status: DC | PRN
  Administered 2020-04-03: 10 mg via ORAL
  Filled 2020-04-03: qty 1
  Filled 2020-04-03 (×2): qty 2

## 2020-04-03 MED ORDER — LORAZEPAM 2 MG/ML IJ SOLN: 0.5000 mg | Freq: Three times a day (TID) | INTRAMUSCULAR | Status: DC | PRN

## 2020-04-03 MED ORDER — LIP MEDEX EX OINT: 1.0000 "application " | TOPICAL_OINTMENT | Freq: Two times a day (BID) | CUTANEOUS | Status: DC

## 2020-04-03 MED ORDER — IOHEXOL 300 MG/ML  SOLN
100.0000 mL | Freq: Once | INTRAMUSCULAR | Status: AC | PRN
  Administered 2020-04-03: 100 mL via INTRAVENOUS

## 2020-04-03 MED ORDER — ACETAMINOPHEN 500 MG PO TABS
1000.0000 mg | ORAL_TABLET | ORAL | Status: AC
  Administered 2020-04-04: 1000 mg via ORAL
  Filled 2020-04-03: qty 2

## 2020-04-03 MED ORDER — DIPHENHYDRAMINE HCL 50 MG/ML IJ SOLN: 12.5000 mg | Freq: Four times a day (QID) | INTRAMUSCULAR | Status: DC | PRN

## 2020-04-03 MED ORDER — ONDANSETRON HCL 4 MG/2ML IJ SOLN: 4.0000 mg | Freq: Four times a day (QID) | INTRAMUSCULAR | Status: DC | PRN

## 2020-04-03 MED ORDER — SODIUM CHLORIDE 0.9 % IV SOLN
2.0000 g | INTRAVENOUS | Status: DC
  Administered 2020-04-04: 2 g via INTRAVENOUS
  Filled 2020-04-03: qty 2

## 2020-04-03 MED ORDER — HYDROMORPHONE HCL 1 MG/ML IJ SOLN
0.5000 mg | INTRAMUSCULAR | Status: DC | PRN
  Administered 2020-04-03: 1 mg via INTRAVENOUS
  Filled 2020-04-03: qty 1

## 2020-04-03 MED ORDER — LACTATED RINGERS IV BOLUS
1000.0000 mL | Freq: Once | INTRAVENOUS | Status: AC
  Administered 2020-04-03: 1000 mL via INTRAVENOUS

## 2020-04-03 MED ORDER — MAGIC MOUTHWASH
15.0000 mL | Freq: Four times a day (QID) | ORAL | Status: DC | PRN
  Filled 2020-04-03: qty 15

## 2020-04-03 MED ORDER — PROCHLORPERAZINE MALEATE 10 MG PO TABS
10.0000 mg | ORAL_TABLET | Freq: Four times a day (QID) | ORAL | Status: DC | PRN
  Filled 2020-04-03: qty 1

## 2020-04-03 MED ORDER — ONDANSETRON 4 MG PO TBDP: 4.0000 mg | ORAL_TABLET | Freq: Four times a day (QID) | ORAL | Status: DC | PRN

## 2020-04-03 MED ORDER — ACETAMINOPHEN 325 MG PO TABS: 650.0000 mg | ORAL_TABLET | Freq: Four times a day (QID) | ORAL | Status: DC | PRN

## 2020-04-03 MED ORDER — BISACODYL 10 MG RE SUPP: 10.0000 mg | Freq: Two times a day (BID) | RECTAL | Status: DC | PRN

## 2020-04-03 MED ORDER — SIMETHICONE 80 MG PO CHEW: 40.0000 mg | CHEWABLE_TABLET | Freq: Four times a day (QID) | ORAL | Status: DC | PRN

## 2020-04-03 MED ORDER — METOPROLOL TARTRATE 5 MG/5ML IV SOLN: 5.0000 mg | Freq: Four times a day (QID) | INTRAVENOUS | Status: DC | PRN

## 2020-04-03 MED ORDER — SODIUM CHLORIDE 0.9 % IV SOLN
2.0000 g | INTRAVENOUS | Status: DC
  Filled 2020-04-03: qty 20

## 2020-04-03 NOTE — ED Notes (Signed)
Transport called.

## 2020-04-03 NOTE — ED Provider Notes (Signed)
E[facility] White Provider Note   CSN: [id number] Arrival date & time: 04/03/20  1549     History Chief Complaint  Patient presents with  . acute appendicits    Erik White is a 24 y.o. male.  Patient complains of right lower quadrant pain.  Patient had a CT scan that showed appendicitis today he was told to come to the emergency department  The history is provided by the patient. No language interpreter was used.  Abdominal Pain Pain location:  RLQ Pain quality: aching   Pain radiates to:  Does not radiate Pain severity:  Moderate Onset quality:  Sudden Timing:  Constant Progression:  Worsening Chronicity:  New Context: not alcohol use   Relieved by:  Nothing Worsened by:  Nothing Ineffective treatments:  None tried Associated symptoms: no anorexia, no chest pain, no cough, no diarrhea, no fatigue and no hematuria   Risk factors: no alcohol abuse        Past Medical History:  Diagnosis Date  . GERD (gastroesophageal reflux disease)   . Headache    migraines  . Mesenteric adenitis 2005  . 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 Right lower quadrant abdominal pain 04/03/2020  . Acute appendicitis 04/03/2020  . Essential hypertension 05/25/2019  . 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 SLAP lesion of shoulder 05/01/2016  . Breast mass in male 04/20/2016  . 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 Patellar tendonitis of left knee 04/20/2016    Past Surgical History:  Procedure Laterality Date  . SHOULDER ARTHROSCOPY WITH LABRAL REPAIR Left 05/28/2016   Procedure: LEFT SHOULDER ARTHROSCOPY WITH ANTERIOR AND POSTERIOR CAPSULOLABRAL RECONSTRUCTION;  Surgeon: Justice Britain, MD;  Location: [place];  Service: Orthopedics;  Laterality: Left;  . WISDOM TOOTH EXTRACTION         Family History  Problem Relation Age of Onset  . Heart attack Father   . Cancer Other        lung  . Cancer  Other        liver CA  . Thyroid disease Mother   . Hypertension Mother     Social History   Tobacco Use  . Smoking status: Never Smoker  . Smokeless tobacco: Never Used  Vaping Use  . Vaping Use: Never used  Substance Use Topics  . Alcohol use: No  . Drug use: No    Home Medications Prior to Admission medications   Medication Sig Start Date End Date Taking? Authorizing Provider  hydrochlorothiazide (HYDRODIURIL) 25 MG tablet Take 1 tablet (25 mg total) by mouth daily. 12/27/18   Gregor Hams, MD    Allergies    Patient has no known allergies.  Review of Systems   Review of Systems  Constitutional: Negative for appetite change and fatigue.  HENT: Negative for congestion, ear discharge and sinus pressure.   Eyes: Negative for discharge.  Respiratory: Negative for cough.   Cardiovascular: Negative for chest pain.  Gastrointestinal: Positive for abdominal pain. Negative for anorexia and diarrhea.  Genitourinary: Negative for frequency and hematuria.  Musculoskeletal: Negative for back pain.  Skin: Negative for rash.  Neurological: Negative for seizures and headaches.  Psychiatric/Behavioral: Negative for hallucinations.    Physical Exam Updated Vital Signs BP 126/79   Pulse 88   Temp 98.6 F (37 C) (Oral)   Resp 18   Ht 6\' 1"  (1.854 m)   Wt 111.6 kg   SpO2 97%  BMI 32.46 kg/m   Physical Exam Vitals and nursing note reviewed.  Constitutional:      Appearance: He is well-developed.  HENT:     Head: Normocephalic.     Mouth/Throat:     Mouth: Mucous membranes are moist.  Eyes:     General: No scleral icterus.    Conjunctiva/sclera: Conjunctivae normal.  Neck:     Thyroid: No thyromegaly.  Cardiovascular:     Rate and Rhythm: Normal rate and regular rhythm.     Heart sounds: No murmur heard.  No friction rub. No gallop.   Pulmonary:     Breath sounds: No stridor. No wheezing or rales.  Chest:     Chest wall: No tenderness.  Abdominal:      General: There is no distension.     Tenderness: There is abdominal tenderness. There is no rebound.  Musculoskeletal:        General: Normal range of motion.     Cervical back: Neck supple.  Lymphadenopathy:     Cervical: No cervical adenopathy.  Skin:    Findings: No erythema or rash.  Neurological:     Mental Status: He is alert and oriented to person, place, and time.     Motor: No abnormal muscle tone.     Coordination: Coordination normal.  Psychiatric:        Behavior: Behavior normal.     ED Results / Procedures / Treatments   Labs (all labs ordered are listed, but only abnormal results are displayed) Labs Reviewed  SARS CORONAVIRUS 2 BY RT PCR ([place], [facility])    EKG None  Radiology CT Abdomen Pelvis W Contrast  Result Date: 04/03/2020 CLINICAL DATA:  Right lower quadrant pain and tenderness for the past 2 days. EXAM: CT ABDOMEN AND PELVIS WITH CONTRAST TECHNIQUE: Multidetector CT imaging of the abdomen and pelvis was performed using the standard protocol following bolus administration of intravenous contrast. CONTRAST:  155mL OMNIPAQUE IOHEXOL 300 MG/ML  SOLN COMPARISON:  CT abdomen pelvis dated January 28, 2015. FINDINGS: Lower chest: No acute abnormality. Hepatobiliary: No focal liver abnormality is seen. No gallstones, gallbladder wall thickening, or biliary dilatation. Pancreas: Unremarkable. No pancreatic ductal dilatation or surrounding inflammatory changes. Spleen: Normal in size without focal abnormality. Adrenals/Urinary Tract: Adrenal glands are unremarkable. Kidneys are normal, without renal calculi, focal lesion, or hydronephrosis. Chronic mild circumferential bladder wall thickening which may be related to underdistension. Stomach/Bowel: Dilated appendix with surrounding inflammatory changes, consistent with acute appendicitis. Appendix: Location: Right lower quadrant Diameter: 13 mm Appendicolith: Punctate appendicolith at  the base (series 4, image 49) Mucosal hyper-enhancement: Present Extraluminal gas: None Periappendiceal collection: None Stomach is within normal limits. No bowel wall thickening or obstruction. Vascular/Lymphatic: No significant vascular findings are present. No enlarged abdominal or pelvic lymph nodes. Reproductive: Prostate is unremarkable. Other: No free fluid or pneumoperitoneum. Musculoskeletal: No acute or significant osseous findings. 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 IMPRESSION: 1. Acute appendicitis. No perforation or abscess. These results will be called to the ordering clinician or representative by the Radiologist Assistant, and communication documented in the PACS or [employer]. Electronically Signed   By: Titus Dubin M.D.   On: 04/03/2020 14:51    Procedures Procedures (including critical care time)  Medications Ordered in ED Medications  piperacillin-tazobactam (ZOSYN) IVPB 3.375 g (has no administration in time range)  Chlorhexidine Gluconate Cloth 2 % PADS 6 each (has no administration in time range)    And  Chlorhexidine Gluconate Cloth 2 % PADS 6 each (has no administration in time range)  cefTRIAXone (ROCEPHIN) 2 g in sodium chloride 0.9 % 100 mL IVPB (has no administration in time range)    And  metroNIDAZOLE (FLAGYL) IVPB 500 mg (has no administration in time range)  acetaminophen (TYLENOL) tablet 1,000 mg (has no administration in time range)  gabapentin (NEURONTIN) capsule 300 mg (has no administration in time range)  celecoxib (CELEBREX) capsule 200 mg (has no administration in time range)    ED Course  I have reviewed the triage vital signs and the nursing notes.  Pertinent labs & imaging results that were available during my care of the patient were reviewed by me and considered in my medical decision making (see chart for details).    MDM Rules/Calculators/A&P                          CT scan shows appendicitis.  General surgeon  has been consulted and will see the patient and admit him and do an appendectomy        This patient presents to the ED for concern of abdominal pain, this involves an extensive number of treatment options, and is a complaint that carries with it a high risk of complications and morbidity.  The differential diagnosis includes appendicitis   Lab Tests:   I Ordered, reviewed, and interpreted labs, which included CBC and chemistries which show white count elevated at 19,000  Medicines ordered:   I ordered medication antibiotics for intra-abdominal infection Imaging Studies ordered:    ordered imaging studies which included CT abdomen and  I independently visualized and interpreted imaging which showed appendicitis  Additional history obtained:   Additional history obtained from relative  Previous records obtained and reviewed.  Consultations Obtained:   I consulted general surgery and discussed lab and imaging findings  Reevaluation:  After the interventions stated above, I reevaluated the patient and found mild improvement  Critical Interventions:  .   Final Clinical Impression(s) / ED Diagnoses Final diagnoses:  Appendicitis, unspecified appendicitis type    Rx / DC Orders ED Discharge Orders    None       Milton Ferguson, MD 04/03/20 1820

## 2020-04-03 NOTE — ED Triage Notes (Signed)
Sent by PCP, had a CT done today and it showed acute appendicitis. Denies N/V/D, chest pain or SOB.

## 2020-04-03 NOTE — Progress Notes (Addendum)
[name] - 24 y.o. male MRN [id number]  Date of birth: Nov 26, 1995  Subjective Chief Complaint  Patient presents with   Abdominal Pain    HPI DILON LANK is a 24 y.o. male with history of mesenteric adenitis, GERD and prior orchitis here today with acute onset of RLQ pain.  He reports that pain started yesterday with worsening over the past 24 hours.  States it is painful to urinate, have bowel movement and walk.  He has not noticed blood in his urine or urinary frequency.  He has had some chills but has not checked his temp.  He denies nausea but appetite has been decreased.  He has not noted blood in his stool.  He does not recall any injury or overuse that would cause a  Pulled muscle.  He denies testicular pain or swelling.   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    Past Surgical History:  Procedure Laterality Date   SHOULDER ARTHROSCOPY WITH LABRAL REPAIR Left 05/28/2016   Procedure: LEFT SHOULDER ARTHROSCOPY WITH ANTERIOR AND POSTERIOR CAPSULOLABRAL RECONSTRUCTION;  Surgeon: Justice Britain, MD;  Location: [place] [facility];  Service: Orthopedics;  Laterality: Left;   WISDOM TOOTH EXTRACTION      Social History   Socioeconomic History   Marital status: Single    Spouse name: Not on file   Number of children: Not on file   Years of education: Not on file   Highest education level: Not on file  Occupational History   Not on file  Tobacco Use   Smoking status: Never Smoker   Smokeless tobacco: Never Used  Substance and Sexual Activity   Alcohol use: No   Drug use: No   Sexual activity: Not on file  Other Topics Concern   Not on file  Social History Narrative   Not on file   Social Determinants of Health   Financial Resource Strain:     Difficulty of Paying Living Expenses:   Food Insecurity:    Worried About [place] in the Last Year:    Arboriculturist in the Last Year:   Transportation Needs:    Film/video editor (Medical):    Lack of Transportation (Non-Medical):   Physical Activity:    Days of Exercise per Week:    Minutes of Exercise per Session:   Stress:    Feeling of Stress :   Social Connections:    Frequency of Communication with Friends and Family:    Frequency of Social Gatherings with Friends and Family:    Attends Religious Services:    Active Member of Clubs or Organizations:    Attends Music therapist:    Marital Status:     Family History  Problem Relation Age of Onset   Heart attack Father    Cancer Other        lung   Cancer Other        liver CA   Thyroid disease Mother    Hypertension Mother     Health Maintenance  Topic Date Due   Hepatitis C Screening  Never done   INFLUENZA VACCINE  05/05/2020   TETANUS/TDAP  04/25/2028   HIV Screening  Completed     -----------------------------------------------------------------------------------------------------------------------------------------------------------------------------------------------------------------  Physical Exam BP 107/66 (BP Location: Right Arm, Patient Position: Sitting, Cuff Size: Large)    Pulse (!) 101    Wt 246 lb 3.2 oz (111.7 kg)    BMI 31.61 kg/m   Physical Exam Constitutional:      Appearance: He is well-developed.  HENT:     Head: Normocephalic and atraumatic.  Cardiovascular:     Rate and Rhythm: Normal rate and regular rhythm.  Pulmonary:     Effort: Pulmonary effort is normal.     Breath sounds: Normal breath sounds.  Abdominal:     General: Abdomen is flat. Bowel sounds are normal.     Palpations: Abdomen is soft.     Tenderness: There is abdominal tenderness in the right lower quadrant. There is no rebound. Positive signs include  McBurney's sign and psoas sign. Negative signs include Rovsing's sign.     Hernia: No hernia is present.  Skin:    General: Skin is warm and dry.     Findings: No rash.  Neurological:     Mental Status: He is alert.  Psychiatric:        Mood and Affect: Mood normal.        Behavior: Behavior normal.     ------------------------------------------------------------------------------------------------------------------------------------------------------------------------------------------------------------------- Assessment and Plan  Right lower quadrant abdominal pain DDX includes appendicitis, mesenteric adenitis, ureterolithiasis, and abdominal wall strain. UA with some protein but no blood, low suspicion for stone.  Given acute onset of symptoms significant tenderness on exam I have ordered stat CMP, CBC w/ Diff and contrast enhanced CT of the abdomen and pelvis today.   Addendum:  CT with acute appendicitis.  Patient instructed to go to ED for emergent surgical evaluation.     No orders of the defined types were placed in this encounter.   No follow-ups on file.    This visit occurred during the SARS-CoV-2 public health emergency.  Safety protocols were in place, including screening questions prior to the visit, additional usage of staff PPE, and extensive cleaning of exam room while observing appropriate contact time as indicated for disinfecting solutions.

## 2020-04-03 NOTE — Patient Instructions (Addendum)
Nice to meet you today! Please stop by lab and have labs completed.  We'll be in touch with results of labs and CT.

## 2020-04-03 NOTE — Telephone Encounter (Signed)
Called and advised patient of imaging results:  Acute appendicitis.   Advised patient to go to the hospital. Either [facility] for immediate treatment.

## 2020-04-03 NOTE — H&P (Signed)
Erik White  January 29, 1996 [id number]  CARE TEAM:  PCP: Luetta Nutting, DO  Outpatient Care Team: Patient Care Team: Luetta Nutting, DO as PCP - General (Family Medicine)  Inpatient Treatment Team: Treatment Team: Attending Provider: Milton Ferguson, MD; Registered Nurse: Kathlen Mody, RN; Technician: Catalina Lunger, NT; Consulting Physician: Edison Pace, Md, MD   This patient is a 24 y.o.male who presents today for surgical evaluation at the request of Verneda Skill, [facility] ED.   Chief complaint / Reason for evaluation: Right lower quadrant abdominal pain with probable appendicitis  Male with history of prior abdominal pain and mesenteric adenitis diagnosed years ago.  History of orchitis and GERD.  Noted worsening abdominal pain starting a little more than a day ago.  Crampy with decreased appetite.  Pain became focal in the right lower side.  Was seen at primary care office.  Appendicitis suspected.  Sent for CAT scan that showed a suspicion of nonperforated appendicitis.  Told to go the emergency room.  Patient went to [facility] emergency room.  Surgery just paged.  Patient in the room with his mother.  He has never had any prior abdominal surgery.  No smoking or diabetes. No personal nor family history of GI/colon cancer, inflammatory bowel disease, irritable bowel syndrome, allergy such as Celiac Sprue, dietary/dairy problems, colitis, ulcers nor gastritis.  No recent sick contacts/gastroenteritis.  No travel outside the country.  No changes in diet.  No dysphagia to solids or liquids.  No significant heartburn or reflux.  No hematochezia, hematemesis, coffee ground emesis.  No evidence of prior gastric/peptic ulceration.  Patiently normally can walk a few miles without difficulty.  Assessment  Erik White  24 y.o. male       Problem List:  Principal Problem:   Acute appendicitis with localized peritonitis Active Problems:   Essential hypertension   Obesity (BMI  30-39.9)   Classic history physical CT scan for appendicitis.  No evidence of abscess or major perforation  Plan:  Admission  IV antibiotics.  IV fluid rehydration.  Nausea medications.  Pain control.  Diagnostic laparoscopy with appendectomy this hospitalization.  There are many surgeries tonight and Covid test is not back so most likely will do in the morning.    The anatomy & physiology of the digestive tract was discussed.  The pathophysiology of appendicitis and other appendiceal disorders were discussed.  Natural history risks without surgery was discussed.   I feel the risks of no intervention will lead to serious problems that outweigh the operative risks; therefore, I recommended diagnostic laparoscopy with removal of appendix to remove the pathology.  Laparoscopic & open techniques were discussed.   I noted a good likelihood this will help address the problem.   Risks such as bleeding, infection, abscess, leak, reoperation, injury to other organs, need for repair of tissues / organs, possible ostomy, hernia, heart attack, stroke, death, and other risks were discussed.  Goals of post-operative recovery were discussed as well.  We will work to minimize complications.  Questions were answered.  The patient expresses understanding & wishes to proceed with surgery.   -VTE prophylaxis- SCDs, etc -mobilize as tolerated to help recovery  35 minutes spent in review, evaluation, examination, counseling, and coordination of care.  More than 50% of that time was spent in counseling.  Adin Hector, MD, FACS, MASCRS Gastrointestinal and Minimally Invasive Surgery  [facility] [address] [place], [place] [address] [phone] Fax [phone]  Main/Paging  CONTACT INFORMATION: Weekday (9AM-5PM) concerns: Call [facility] main office at [phone] Weeknight (5PM-9AM) or Weekend/Holiday concerns: Check [email or web address] for General Surgery [facility]  coverage (Please, do not use SecureChat as it is not reliable communication to operating surgeons for immediate patient care)      04/03/2020      Past Medical History:  Diagnosis Date  . GERD (gastroesophageal reflux disease)   . Headache    migraines  . Mesenteric adenitis 2005  . 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 SHOULDER ARTHROSCOPY WITH LABRAL REPAIR Left 05/28/2016   Procedure: LEFT SHOULDER ARTHROSCOPY WITH ANTERIOR AND POSTERIOR CAPSULOLABRAL RECONSTRUCTION;  Surgeon: Justice Britain, MD;  Location: [place];  Service: Orthopedics;  Laterality: Left;  . WISDOM TOOTH EXTRACTION      Social History   Socioeconomic History  . Marital status: Single    Spouse name: Not on file  . Number of children: Not on file  . Years of education: Not on file  . Highest education level: Not on file  Occupational History  . Not on file  Tobacco Use  . Smoking status: Never Smoker  . Smokeless tobacco: Never Used  Vaping Use  . Vaping Use: Never used  Substance and Sexual Activity  . Alcohol use: No  . Drug use: No  . Sexual activity: Not on file  Other Topics Concern  . Not on file  Social History Narrative  . Not on file   Social Determinants of Health   Financial Resource Strain:   . Difficulty of Paying Living Expenses:   Food Insecurity:   . Worried About Charity fundraiser in the Last Year:   . Arboriculturist in the Last Year:   Transportation Needs:   . Film/video editor (Medical):   Marland Kitchen Lack of Transportation (Non-Medical):   Physical Activity:   . Days of Exercise per Week:   . Minutes of Exercise per Session:   Stress:   . Feeling of Stress :   Social Connections:   . Frequency of Communication with Friends and Family:   . Frequency of Social Gatherings with Friends and Family:   . Attends Religious Services:   . Active Member of Clubs or Organizations:   . Attends Archivist Meetings:   Marland Kitchen  Marital Status:   Intimate Partner Violence:   . Fear of Current or Ex-Partner:   . Emotionally Abused:   Marland Kitchen Physically Abused:   . Sexually Abused:     Family History  Problem Relation Age of Onset  . Heart attack Father   . Cancer Other        lung  . Cancer Other        liver CA  . Thyroid disease Mother   . Hypertension Mother     Current Facility-Administered Medications  Medication Dose Route Frequency Provider Last Rate Last Admin  . piperacillin-tazobactam (ZOSYN) IVPB 3.375 g  3.375 g Intravenous Once Milton Ferguson, MD       Current Outpatient Medications  Medication Sig Dispense Refill  . hydrochlorothiazide (HYDRODIURIL) 25 MG tablet Take 1 tablet (25 mg total) by mouth daily. 90 tablet 1     No Known Allergies  ROS:   All other systems reviewed & are negative except per HPI or as noted below: Constitutional: No fevers, +chills, sweats.  Weight stable Eyes:  No vision changes, No discharge HENT:  No sore throats, nasal drainage Lymph: No  neck swelling, No bruising easily Pulmonary:  No cough, productive sputum CV: No orthopnea, PND  Patient walks 30 minutes for about 2 miles without difficulty.  No exertional chest/neck/shoulder/arm pain. GI: No personal nor family history of GI/colon cancer, inflammatory bowel disease, irritable bowel syndrome, allergy such as Celiac Sprue, dietary/dairy problems, colitis, ulcers nor gastritis.  No recent sick contacts/gastroenteritis.  No travel outside the country.  No changes in diet. Renal: No UTIs, No hematuria Genital:  No drainage, bleeding, masses Musculoskeletal: No severe joint pain.  Good ROM major joints Skin:  No sores or lesions.  No rashes Heme/Lymph:  No easy bleeding.  No swollen lymph nodes Neuro: No focal weakness/numbness.  No seizures Psych: No suicidal ideation.  No hallucinations  BP 133/75   Pulse 99   Temp 98.6 F (37 C) (Oral)   Resp 18   Ht 6\' 1"  (1.854 m)   Wt 111.6 kg   SpO2 97%   BMI  32.46 kg/m   Physical Exam: Constitutional: Not cachectic.  Hygeine adequate.  Vitals signs as above.   Eyes: Pupils reactive, normal extraocular movements. Sclera nonicteric Neuro: CN II-XII intact.  No major focal sensory defects.  No major motor deficits. Lymph: No head/neck/groin lymphadenopathy Psych:  No severe agitation.  No severe anxiety.  Judgment & insight Adequate, Oriented x4, HENT: Normocephalic, Mucus membranes moist.  No thrush.   Neck: Supple, No tracheal deviation.  No obvious thyromegaly Chest: No pain to chest wall compression.  Good respiratory excursion.  No audible wheezing CV:  Pulses intact.  Regular rhythm.  No major extremity edema Abdomen:  Soft.  Mildly distended.  Tenderness at RLQ.  Some voluntary guarding.  Evidence of reproduction of pain and peritonitis with cough and bed shake.  Positive Rovsing sign.  Mild psoas sign.  No incarcerated hernias.  No hepatomegaly.  No splenomegaly Gen:  No inguinal hernias.  No inguinal lymphadenopathy.   Ext: No obvious deformity or contracture no significant edema.  No cyanosis Skin: No major subcutaneous nodules.  Warm and dry Musculoskeletal: Severe joint rigidity not present.  No obvious clubbing.  No digital petechiae.     Results:   Labs: Results for orders placed or performed in visit on 04/03/20 (from the past 48 hour(s))  I-STAT Creatinine (manual entry)     Status: None   Collection Time: 04/03/20  2:42 PM  Result Value Ref Range   Creatinine, Ser 1.00 0.50 - 1.10    Imaging / Studies: CT Abdomen Pelvis W Contrast  Result Date: 04/03/2020 CLINICAL DATA:  Right lower quadrant pain and tenderness for the past 2 days. EXAM: CT ABDOMEN AND PELVIS WITH CONTRAST TECHNIQUE: Multidetector CT imaging of the abdomen and pelvis was performed using the standard protocol following bolus administration of intravenous contrast. CONTRAST:  160mL OMNIPAQUE IOHEXOL 300 MG/ML  SOLN COMPARISON:  CT abdomen pelvis dated January 28, 2015. FINDINGS: Lower chest: No acute abnormality. Hepatobiliary: No focal liver abnormality is seen. No gallstones, gallbladder wall thickening, or biliary dilatation. Pancreas: Unremarkable. No pancreatic ductal dilatation or surrounding inflammatory changes. Spleen: Normal in size without focal abnormality. Adrenals/Urinary Tract: Adrenal glands are unremarkable. Kidneys are normal, without renal calculi, focal lesion, or hydronephrosis. Chronic mild circumferential bladder wall thickening which may be related to underdistension. Stomach/Bowel: Dilated appendix with surrounding inflammatory changes, consistent with acute appendicitis. Appendix: Location: Right lower quadrant Diameter: 13 mm Appendicolith: Punctate appendicolith at the base (series 4, image 49) Mucosal hyper-enhancement: Present Extraluminal gas: None Periappendiceal collection: None  Stomach is within normal limits. No bowel wall thickening or obstruction. Vascular/Lymphatic: No significant vascular findings are present. No enlarged abdominal or pelvic lymph nodes. Reproductive: Prostate is unremarkable. Other: No free fluid or pneumoperitoneum. Musculoskeletal: No acute or significant osseous findings. 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 IMPRESSION: 1. Acute appendicitis. No perforation or abscess. These results will be called to the ordering clinician or representative by the Radiologist Assistant, and communication documented in the PACS or [employer]. Electronically Signed   By: Titus Dubin M.D.   On: 04/03/2020 14:51    Medications / Allergies: per chart  Antibiotics: Anti-infectives (From admission, onward)   Start     Dose/Rate Route Frequency Ordered Stop   04/03/20 1745  piperacillin-tazobactam (ZOSYN) IVPB 3.375 g     Discontinue     3.375 g 100 mL/hr over 30 Minutes Intravenous  Once 04/03/20 1736          Note: Portions of this report may have been transcribed using voice recognition  software. Every effort was made to ensure accuracy; however, inadvertent computerized transcription errors may be present.   Any transcriptional errors that result from this process are unintentional.    Adin Hector, MD, FACS, MASCRS Gastrointestinal and Minimally Invasive Surgery  [facility] [address], [place], [place] [address] [phone] Fax [phone] Main/Paging  CONTACT INFORMATION: Weekday (9AM-5PM) concerns: Call [facility] main office at [phone] Weeknight (5PM-9AM) or Weekend/Holiday concerns: Check [email or web address] for General Surgery [facility] coverage (Please, do not use SecureChat as it is not reliable communication to operating surgeons for immediate patient care)      04/03/2020  6:27 PM

## 2020-04-03 NOTE — Assessment & Plan Note (Addendum)
DDX includes appendicitis, mesenteric adenitis, ureterolithiasis, and abdominal wall strain. UA with some protein but no blood, low suspicion for stone.  Given acute onset of symptoms significant tenderness on exam I have ordered stat CMP, CBC w/ Diff and contrast enhanced CT of the abdomen and pelvis today.   Addendum:  CT with acute appendicitis.  Patient instructed to go to ED for emergent surgical evaluation.

## 2020-04-04 ENCOUNTER — Observation Stay (HOSPITAL_COMMUNITY): Payer: No Typology Code available for payment source | Admitting: Certified Registered Nurse Anesthetist

## 2020-04-04 ENCOUNTER — Encounter (HOSPITAL_COMMUNITY)
Admission: EM | Disposition: A | Discharge status: Home / Self Care | Payer: Self-pay | Source: Home / Self Care | Attending: Emergency Medicine

## 2020-04-04 ENCOUNTER — Encounter (HOSPITAL_COMMUNITY): Payer: Self-pay

## 2020-04-04 HISTORY — PX: LAPAROSCOPIC APPENDECTOMY: SHX408

## 2020-04-04 LAB — SURGICAL PCR SCREEN
MRSA, PCR: NEGATIVE
Staphylococcus aureus: NEGATIVE

## 2020-04-04 SURGERY — APPENDECTOMY, LAPAROSCOPIC
Anesthesia: General

## 2020-04-04 MED ORDER — DEXAMETHASONE SODIUM PHOSPHATE 10 MG/ML IJ SOLN
INTRAMUSCULAR | Status: AC
  Filled 2020-04-04: qty 1

## 2020-04-04 MED ORDER — OXYCODONE HCL 5 MG/5ML PO SOLN: 5.0000 mg | Freq: Once | ORAL | Status: DC | PRN

## 2020-04-04 MED ORDER — ACETAMINOPHEN 325 MG PO TABS: 650.0000 mg | ORAL_TABLET | Freq: Four times a day (QID) | ORAL | Status: DC | PRN

## 2020-04-04 MED ORDER — PROMETHAZINE HCL 25 MG/ML IJ SOLN: 6.2500 mg | INTRAMUSCULAR | Status: DC | PRN

## 2020-04-04 MED ORDER — IBUPROFEN 200 MG PO TABS
600.0000 mg | ORAL_TABLET | Freq: Three times a day (TID) | ORAL | Status: DC
  Administered 2020-04-04 – 2020-04-05 (×3): 600 mg via ORAL
  Filled 2020-04-04 (×3): qty 3

## 2020-04-04 MED ORDER — IBUPROFEN 200 MG PO TABS
ORAL_TABLET | ORAL | Status: DC
Start: 2020-04-04 — End: 2021-08-06

## 2020-04-04 MED ORDER — OXYCODONE HCL 5 MG PO TABS: 5.0000 mg | ORAL_TABLET | Freq: Once | ORAL | Status: DC | PRN

## 2020-04-04 MED ORDER — SCOPOLAMINE 1 MG/3DAYS TD PT72
1.0000 | MEDICATED_PATCH | Freq: Once | TRANSDERMAL | Status: AC
  Administered 2020-04-04: 1 via TRANSDERMAL
  Filled 2020-04-04: qty 1

## 2020-04-04 MED ORDER — MIDAZOLAM HCL 5 MG/5ML IJ SOLN
INTRAMUSCULAR | Status: DC | PRN
  Administered 2020-04-04: 2 mg via INTRAVENOUS

## 2020-04-04 MED ORDER — FENTANYL CITRATE (PF) 100 MCG/2ML IJ SOLN
INTRAMUSCULAR | Status: AC
  Administered 2020-04-04: 50 ug via INTRAVENOUS
  Filled 2020-04-04: qty 2

## 2020-04-04 MED ORDER — LIDOCAINE 2% (20 MG/ML) 5 ML SYRINGE
INTRAMUSCULAR | Status: AC
  Filled 2020-04-04: qty 5

## 2020-04-04 MED ORDER — FENTANYL CITRATE (PF) 100 MCG/2ML IJ SOLN
25.0000 ug | INTRAMUSCULAR | Status: DC | PRN
  Administered 2020-04-04 (×2): 50 ug via INTRAVENOUS

## 2020-04-04 MED ORDER — ONDANSETRON HCL 4 MG/2ML IJ SOLN: 4.0000 mg | Freq: Four times a day (QID) | INTRAMUSCULAR | Status: DC | PRN

## 2020-04-04 MED ORDER — ONDANSETRON HCL 4 MG/2ML IJ SOLN
INTRAMUSCULAR | Status: DC | PRN
  Administered 2020-04-04: 4 mg via INTRAVENOUS

## 2020-04-04 MED ORDER — SODIUM CHLORIDE 0.9 % IV SOLN
2.0000 g | INTRAVENOUS | Status: DC
  Administered 2020-04-04: 2 g via INTRAVENOUS
  Filled 2020-04-04: qty 2

## 2020-04-04 MED ORDER — SUGAMMADEX SODIUM 200 MG/2ML IV SOLN
INTRAVENOUS | Status: DC | PRN
  Administered 2020-04-04: 200 mg via INTRAVENOUS

## 2020-04-04 MED ORDER — PROPOFOL 10 MG/ML IV BOLUS
INTRAVENOUS | Status: AC
  Filled 2020-04-04: qty 20

## 2020-04-04 MED ORDER — ACETAMINOPHEN 325 MG PO TABS
650.0000 mg | ORAL_TABLET | Freq: Four times a day (QID) | ORAL | Status: DC
  Administered 2020-04-04 – 2020-04-05 (×3): 650 mg via ORAL
  Filled 2020-04-04 (×3): qty 2

## 2020-04-04 MED ORDER — SODIUM CHLORIDE 0.9 % IV SOLN: INTRAVENOUS | Status: DC

## 2020-04-04 MED ORDER — ROCURONIUM BROMIDE 10 MG/ML (PF) SYRINGE
PREFILLED_SYRINGE | INTRAVENOUS | Status: AC
  Filled 2020-04-04: qty 10

## 2020-04-04 MED ORDER — METRONIDAZOLE IN NACL 5-0.79 MG/ML-% IV SOLN
500.0000 mg | Freq: Three times a day (TID) | INTRAVENOUS | Status: DC
  Administered 2020-04-04 – 2020-04-05 (×3): 500 mg via INTRAVENOUS
  Filled 2020-04-04 (×3): qty 100

## 2020-04-04 MED ORDER — OXYCODONE HCL 5 MG PO TABS: ORAL_TABLET | ORAL | 0 refills | Status: DC

## 2020-04-04 MED ORDER — TRAMADOL HCL 50 MG PO TABS: 50.0000 mg | ORAL_TABLET | Freq: Four times a day (QID) | ORAL | Status: DC | PRN

## 2020-04-04 MED ORDER — MIDAZOLAM HCL 2 MG/2ML IJ SOLN
INTRAMUSCULAR | Status: AC
  Filled 2020-04-04: qty 2

## 2020-04-04 MED ORDER — LACTATED RINGERS IR SOLN
Status: DC | PRN
  Administered 2020-04-04: 1000 mL

## 2020-04-04 MED ORDER — DEXAMETHASONE SODIUM PHOSPHATE 10 MG/ML IJ SOLN
INTRAMUSCULAR | Status: DC | PRN
  Administered 2020-04-04: 8 mg via INTRAVENOUS

## 2020-04-04 MED ORDER — ROCURONIUM BROMIDE 10 MG/ML (PF) SYRINGE
PREFILLED_SYRINGE | INTRAVENOUS | Status: DC | PRN
  Administered 2020-04-04: 60 mg via INTRAVENOUS
  Administered 2020-04-04: 20 mg via INTRAVENOUS

## 2020-04-04 MED ORDER — HYDROMORPHONE HCL 1 MG/ML IJ SOLN
1.0000 mg | INTRAMUSCULAR | Status: DC | PRN
  Administered 2020-04-04: 1 mg via INTRAVENOUS
  Filled 2020-04-04: qty 1

## 2020-04-04 MED ORDER — BUPIVACAINE-EPINEPHRINE 0.25% -1:200000 IJ SOLN
INTRAMUSCULAR | Status: DC | PRN
  Administered 2020-04-04: 30 mL

## 2020-04-04 MED ORDER — PROPOFOL 10 MG/ML IV BOLUS
INTRAVENOUS | Status: DC | PRN
  Administered 2020-04-04: 200 mg via INTRAVENOUS

## 2020-04-04 MED ORDER — OXYCODONE HCL 5 MG PO TABS
5.0000 mg | ORAL_TABLET | ORAL | Status: DC | PRN
  Administered 2020-04-05: 5 mg via ORAL
  Administered 2020-04-05: 10 mg via ORAL

## 2020-04-04 MED ORDER — HYDROCHLOROTHIAZIDE 25 MG PO TABS
25.0000 mg | ORAL_TABLET | Freq: Every day | ORAL | Status: DC
  Administered 2020-04-05: 25 mg via ORAL
  Filled 2020-04-04: qty 1

## 2020-04-04 MED ORDER — CELECOXIB 200 MG PO CAPS
200.0000 mg | ORAL_CAPSULE | Freq: Once | ORAL | Status: AC
  Administered 2020-04-04: 200 mg via ORAL
  Filled 2020-04-04: qty 1

## 2020-04-04 MED ORDER — LACTATED RINGERS IV SOLN: INTRAVENOUS | Status: DC

## 2020-04-04 MED ORDER — LIDOCAINE 2% (20 MG/ML) 5 ML SYRINGE
INTRAMUSCULAR | Status: DC | PRN
  Administered 2020-04-04: 100 mg via INTRAVENOUS

## 2020-04-04 MED ORDER — GABAPENTIN 300 MG PO CAPS
300.0000 mg | ORAL_CAPSULE | Freq: Once | ORAL | Status: AC
  Administered 2020-04-04: 300 mg via ORAL
  Filled 2020-04-04: qty 1

## 2020-04-04 MED ORDER — SODIUM CHLORIDE 0.45 % IV SOLN: INTRAVENOUS | Status: DC

## 2020-04-04 MED ORDER — ACETAMINOPHEN 325 MG PO TABS: 650.0000 mg | ORAL_TABLET | Freq: Four times a day (QID) | ORAL | Status: DC

## 2020-04-04 MED ORDER — ACETAMINOPHEN 650 MG RE SUPP: 650.0000 mg | Freq: Four times a day (QID) | RECTAL | Status: DC | PRN

## 2020-04-04 MED ORDER — FENTANYL CITRATE (PF) 100 MCG/2ML IJ SOLN
INTRAMUSCULAR | Status: AC
  Filled 2020-04-04: qty 2

## 2020-04-04 MED ORDER — AMOXICILLIN-POT CLAVULANATE 875-125 MG PO TABS
1.0000 | ORAL_TABLET | Freq: Two times a day (BID) | ORAL | 0 refills | Status: AC
Start: 2020-04-04 — End: 2020-04-11

## 2020-04-04 MED ORDER — ONDANSETRON 4 MG PO TBDP: 4.0000 mg | ORAL_TABLET | Freq: Four times a day (QID) | ORAL | Status: DC | PRN

## 2020-04-04 MED ORDER — FENTANYL CITRATE (PF) 250 MCG/5ML IJ SOLN
INTRAMUSCULAR | Status: AC
  Filled 2020-04-04: qty 5

## 2020-04-04 MED ORDER — EPHEDRINE 5 MG/ML INJ
INTRAVENOUS | Status: AC
  Filled 2020-04-04: qty 10

## 2020-04-04 MED ORDER — FENTANYL CITRATE (PF) 100 MCG/2ML IJ SOLN
INTRAMUSCULAR | Status: DC | PRN
  Administered 2020-04-04 (×2): 50 ug via INTRAVENOUS
  Administered 2020-04-04: 100 ug via INTRAVENOUS

## 2020-04-04 MED ORDER — ONDANSETRON HCL 4 MG/2ML IJ SOLN
INTRAMUSCULAR | Status: AC
  Filled 2020-04-04: qty 4

## 2020-04-04 MED ORDER — CHLORHEXIDINE GLUCONATE 0.12 % MT SOLN
15.0000 mL | OROMUCOSAL | Status: AC
  Administered 2020-04-04: 15 mL via OROMUCOSAL

## 2020-04-04 MED ORDER — BUPIVACAINE-EPINEPHRINE (PF) 0.25% -1:200000 IJ SOLN
INTRAMUSCULAR | Status: AC
  Filled 2020-04-04: qty 30

## 2020-04-04 SURGICAL SUPPLY — 34 items
APPLIER CLIP ROT 10 11.4 M/L (STAPLE)
CHLORAPREP W/TINT 26 (MISCELLANEOUS) ×2 IMPLANT
CLIP APPLIE ROT 10 11.4 M/L (STAPLE) IMPLANT
CNTNR URN SCR LID CUP LEK RST (MISCELLANEOUS) ×1 IMPLANT
CONT SPEC 4OZ STRL OR WHT (MISCELLANEOUS) ×2
COVER SURGICAL LIGHT HANDLE (MISCELLANEOUS) ×2 IMPLANT
COVER WAND RF STERILE (DRAPES) IMPLANT
CUTTER FLEX LINEAR 45M (STAPLE) ×2 IMPLANT
DECANTER SPIKE VIAL GLASS SM (MISCELLANEOUS) ×2 IMPLANT
DERMABOND ADVANCED (GAUZE/BANDAGES/DRESSINGS) ×1
DERMABOND ADVANCED .7 DNX12 (GAUZE/BANDAGES/DRESSINGS) ×1 IMPLANT
DRAPE LAPAROSCOPIC ABDOMINAL (DRAPES) ×2 IMPLANT
ELECT REM PT RETURN 15FT ADLT (MISCELLANEOUS) ×2 IMPLANT
ENDOLOOP SUT PDS II  0 18 (SUTURE)
ENDOLOOP SUT PDS II 0 18 (SUTURE) IMPLANT
GLOVE SURG ORTHO 8.0 STRL STRW (GLOVE) ×2 IMPLANT
GOWN STRL REUS W/TWL XL LVL3 (GOWN DISPOSABLE) ×4 IMPLANT
KIT BASIN (CUSTOM PROCEDURE TRAY) ×2 IMPLANT
KIT TURNOVER KIT A (KITS) IMPLANT
PENCIL SMOKE EVACUATOR (MISCELLANEOUS) ×2 IMPLANT
POUCH SPECIMEN RETRIEVAL 10MM (ENDOMECHANICALS) ×2 IMPLANT
RELOAD 45 VASCULAR/THIN (ENDOMECHANICALS) IMPLANT
RELOAD STAPLE TA45 3.5 REG BLU (ENDOMECHANICALS) ×2 IMPLANT
SET IRRIG TUBING LAPAROSCOPIC (IRRIGATION / IRRIGATOR) ×2 IMPLANT
SET TUBE SMOKE EVAC HIGH FLOW (TUBING) ×2 IMPLANT
SHEARS HARMONIC ACE PLUS 36CM (ENDOMECHANICALS) ×2 IMPLANT
STRIP CLOSURE SKIN 1/2X4 (GAUZE/BANDAGES/DRESSINGS) ×2 IMPLANT
SUT MNCRL AB 4-0 PS2 18 (SUTURE) ×2 IMPLANT
TOWEL OR 17X26 10 PK STRL BLUE (TOWEL DISPOSABLE) ×2 IMPLANT
TOWEL OR NON WOVEN STRL DISP B (DISPOSABLE) ×2 IMPLANT
TRAY FOLEY MTR SLVR 16FR STAT (SET/KITS/TRAYS/PACK) ×2 IMPLANT
TRAY LAPAROSCOPIC (CUSTOM PROCEDURE TRAY) ×2 IMPLANT
TROCAR XCEL BLUNT TIP 100MML (ENDOMECHANICALS) ×2 IMPLANT
TROCAR XCEL NON-BLD 11X100MML (ENDOMECHANICALS) ×2 IMPLANT

## 2020-04-04 NOTE — Discharge Instructions (Signed)
[facility] LAPAROSCOPIC SURGERY: POST OP INSTRUCTIONS Always review your discharge instruction sheet given to you by the facility where your surgery was performed. IF YOU HAVE DISABILITY OR FAMILY LEAVE FORMS, YOU MUST BRING THEM TO THE OFFICE FOR PROCESSING.   DO NOT GIVE THEM TO YOUR DOCTOR.  PAIN CONTROL  1. First take acetaminophen (Tylenol) AND/or ibuprofen (Advil) to control your pain after surgery.  Follow directions on package.  Taking acetaminophen (Tylenol) and/or ibuprofen (Advil) regularly after surgery will help to control your pain and lower the amount of prescription pain medication you may need.  You should not take more than 3,000 mg (3 grams) of acetaminophen (Tylenol) in 24 hours.  You should not take ibuprofen (Advil), aleve, motrin, naprosyn or other NSAIDS if you have a history of stomach ulcers or chronic kidney disease.  2. A prescription for pain medication may be given to you upon discharge.  Take your pain medication as prescribed, if you still have uncontrolled pain after taking acetaminophen (Tylenol) or ibuprofen (Advil). 3. Use ice packs to help control pain. 4. If you need a refill on your pain medication, please contact your pharmacy.  They will contact our office to request authorization. Prescriptions will not be filled after 5pm or on week-ends.  HOME MEDICATIONS 5. Take your usually prescribed medications unless otherwise directed.  DIET 6. You should follow a light diet the first few days after arrival home.  Be sure to include lots of fluids daily. Avoid fatty, fried foods.   CONSTIPATION 7. It is common to experience some constipation after surgery and if you are taking pain medication.  Increasing fluid intake and taking a stool softener (such as Colace) will usually help or prevent this problem from occurring.  A mild laxative (Milk of Magnesia or Miralax) should be taken according to package instructions if there are no bowel  movements after 48 hours.  WOUND/INCISION CARE 8. Most patients will experience some swelling and bruising in the area of the incisions.  Ice packs will help.  Swelling and bruising can take several days to resolve.  9. Unless discharge instructions indicate otherwise, follow guidelines below  a. STERI-STRIPS - you may remove your outer bandages 48 hours after surgery, and you may shower at that time.  You have steri-strips (small skin tapes) in place directly over the incision.  These strips should be left on the skin for 7-10 days.   b. DERMABOND/SKIN GLUE - you may shower in 24 hours.  The glue will flake off over the next 2-3 weeks. 10. Any sutures or staples will be removed at the office during your follow-up visit.  ACTIVITIES 11. You may resume regular (light) daily activities beginning the next day--such as daily self-care, walking, climbing stairs--gradually increasing activities as tolerated.  You may have sexual intercourse when it is comfortable.  Refrain from any heavy lifting or straining until approved by your doctor. a. You may drive when you are no longer taking prescription pain medication, you can comfortably wear a seatbelt, and you can safely maneuver your car and apply brakes.  FOLLOW-UP 12. You should see your doctor in the office for a follow-up appointment approximately 2-3 weeks after your surgery.  You should have been given your post-op/follow-up appointment when your surgery was scheduled.  If you did not receive a post-op/follow-up appointment, make sure that you call for this appointment within a day or two after you arrive home to insure a convenient appointment time.     WHEN TO CALL YOUR DOCTOR: 1. Fever over 101.0 2. Inability to urinate 3. Continued bleeding from incision. 4. Increased pain, redness, or drainage from the incision. 5. Increasing abdominal pain  The clinic staff is available to answer your questions during regular business hours.  Please don't  hesitate to call and ask to speak to one of the nurses for clinical concerns.  If you have a medical emergency, go to the nearest emergency room or call 911.  A surgeon from [facility] is always on call at the hospital. [address], [address], [place], [place]  [address] ? [address] [address], , [place]   [address] [phone] ? [phone] ? FAX [phone] Web site: [email or web address]  .........   Managing Your Pain After Surgery Without Opioids    Thank you for participating in our program to help patients manage their pain after surgery without opioids. This is part of our effort to provide you with the best care possible, without exposing you or your family to the risk that opioids pose.  What pain can I expect after surgery? You can expect to have some pain after surgery. This is normal. The pain is typically worse the day after surgery, and quickly begins to get better. Many studies have found that many patients are able to manage their pain after surgery with Over-the-Counter (OTC) medications such as Tylenol and Motrin. If you have a condition that does not allow you to take Tylenol or Motrin, notify your surgical team.  How will I manage my pain? The best strategy for controlling your pain after surgery is around the clock pain control with Tylenol (acetaminophen) and Motrin (ibuprofen or Advil). Alternating these medications with each other allows you to maximize your pain control. In addition to Tylenol and Motrin, you can use heating pads or ice packs on your incisions to help reduce your pain.  How will I alternate your regular strength over-the-counter pain medication? You will take a dose of pain medication every three hours. ; Start by taking 650 mg of Tylenol (2 pills of 325 mg) ; 3 hours later take 600 mg of Motrin (3 pills of 200 mg) ; 3 hours after taking the Motrin take 650 mg of Tylenol ; 3 hours after that take 600 mg of  Motrin.   - 1 -  See example - if your first dose of Tylenol is at 12:00 PM   12:00 PM Tylenol 650 mg (2 pills of 325 mg)  3:00 PM Motrin 600 mg (3 pills of 200 mg)  6:00 PM Tylenol 650 mg (2 pills of 325 mg)  9:00 PM Motrin 600 mg (3 pills of 200 mg)  Continue alternating every 3 hours   We recommend that you follow this schedule around-the-clock for at least 3 days after surgery, or until you feel that it is no longer needed. Use the table on the last page of this handout to keep track of the medications you are taking. Important: Do not take more than 3000mg of Tylenol or 3200mg of Motrin in a 24-hour period. Do not take ibuprofen/Motrin if you have a history of bleeding stomach ulcers, severe kidney disease, &/or actively taking a blood thinner  What if I still have pain? If you have pain that is not controlled with the over-the-counter pain medications (Tylenol and Motrin or Advil) you might have what we call "breakthrough" pain. You will receive a prescription for a small amount of an opioid pain medication such as   Oxycodone, Tramadol, or Tylenol with Codeine. Use these opioid pills in the first 24 hours after surgery if you have breakthrough pain. Do not take more than 1 pill every 4-6 hours.  If you still have uncontrolled pain after using all opioid pills, don't hesitate to call our staff using the number provided. We will help make sure you are managing your pain in the best way possible, and if necessary, we can provide a prescription for additional pain medication.   Day 1    Time  Name of Medication Number of pills taken  Amount of Acetaminophen  Pain Level   Comments  AM PM       AM PM       AM PM       AM PM       AM PM       AM PM       AM PM       AM PM       Total Daily amount of Acetaminophen Do not take more than  3,000 mg per day      Day 2    Time  Name of Medication Number of pills taken  Amount of Acetaminophen  Pain Level   Comments  AM  PM       AM PM       AM PM       AM PM       AM PM       AM PM       AM PM       AM PM       Total Daily amount of Acetaminophen Do not take more than  3,000 mg per day      Day 3    Time  Name of Medication Number of pills taken  Amount of Acetaminophen  Pain Level   Comments  AM PM       AM PM       AM PM       AM PM          AM PM       AM PM       AM PM       AM PM       Total Daily amount of Acetaminophen Do not take more than  3,000 mg per day      Day 4    Time  Name of Medication Number of pills taken  Amount of Acetaminophen  Pain Level   Comments  AM PM       AM PM       AM PM       AM PM       AM PM       AM PM       AM PM       AM PM       Total Daily amount of Acetaminophen Do not take more than  3,000 mg per day      Day 5    Time  Name of Medication Number of pills taken  Amount of Acetaminophen  Pain Level   Comments  AM PM       AM PM       AM PM       AM PM       AM PM       AM PM       AM PM         AM PM       Total Daily amount of Acetaminophen Do not take more than  3,000 mg per day       Day 6    Time  Name of Medication Number of pills taken  Amount of Acetaminophen  Pain Level  Comments  AM PM       AM PM       AM PM       AM PM       AM PM       AM PM       AM PM       AM PM       Total Daily amount of Acetaminophen Do not take more than  3,000 mg per day      Day 7    Time  Name of Medication Number of pills taken  Amount of Acetaminophen  Pain Level   Comments  AM PM       AM PM       AM PM       AM PM       AM PM       AM PM       AM PM       AM PM       Total Daily amount of Acetaminophen Do not take more than  3,000 mg per day        For additional information about how and where to safely dispose of unused opioid medications - https://www.morepowerfulnc.org  Disclaimer: This document contains information and/or instructional materials adapted from [facility]  for the typical patient with your condition. It does not replace medical advice from your health care provider because your experience may differ from that of the typical patient. Talk to your health care provider if you have any questions about this document, your condition or your treatment plan. Adapted from [facility]  

## 2020-04-04 NOTE — Anesthesia Postprocedure Evaluation (Signed)
Anesthesia Post Note  Patient: Erik White  Procedure(s) Performed: APPENDECTOMY LAPAROSCOPIC (N/A )     Patient location during evaluation: PACU Anesthesia Type: General Level of consciousness: awake and alert and oriented Pain management: pain level controlled Vital Signs Assessment: post-procedure vital signs reviewed and stable Respiratory status: spontaneous breathing, nonlabored ventilation and respiratory function stable Cardiovascular status: blood pressure returned to baseline Postop Assessment: no apparent nausea or vomiting Anesthetic complications: no   No complications documented.  Last Vitals:  Vitals:   04/04/20 1316 04/04/20 1411  BP: 113/72 120/64  Pulse: 82 76  Resp: 16 18  Temp: 36.7 C 36.5 C  SpO2: 98% 98%    Last Pain:  Vitals:   04/04/20 1411  TempSrc: Oral  PainSc:                  Brennan Bailey

## 2020-04-04 NOTE — Transfer of Care (Signed)
Immediate Anesthesia Transfer of Care Note  Patient: Erik White  Procedure(s) Performed: APPENDECTOMY LAPAROSCOPIC (N/A )  Patient Location: PACU  Anesthesia Type:General  Level of Consciousness: awake, alert  and patient cooperative  Airway & Oxygen Therapy: Patient Spontanous Breathing and Patient connected to face mask oxygen  Post-op Assessment: Report given to RN and Post -op Vital signs reviewed and stable  Post vital signs: Reviewed and stable  Last Vitals:  Vitals Value Taken Time  BP 129/62 04/04/20 1148  Temp 37.2 C 04/04/20 1148  Pulse 79 04/04/20 1154  Resp 0 04/04/20 1154  SpO2 100 % 04/04/20 1154  Vitals shown include unvalidated device data.  Last Pain:  Vitals:   04/04/20 1148  TempSrc:   PainSc: Asleep      Patients Stated Pain Goal: 2 (66/81/59 4707)  Complications: No complications documented.

## 2020-04-04 NOTE — Anesthesia Preprocedure Evaluation (Addendum)
Anesthesia Evaluation  Patient identified by MRN, date of birth, ID band Patient awake    Reviewed: Allergy & Precautions, NPO status , Patient's Chart, lab work & pertinent test results  History of Anesthesia Complications Negative for: history of anesthetic complications  Airway Mallampati: II  TM Distance: >3 FB Neck ROM: Full    Dental no notable dental hx.    Pulmonary neg pulmonary ROS,    Pulmonary exam normal        Cardiovascular hypertension, Pt. on medications Normal cardiovascular exam     Neuro/Psych negative neurological ROS  negative psych ROS   GI/Hepatic Neg liver ROS, GERD  ,Acute appendicitis   Endo/Other  negative endocrine ROS  Renal/GU negative Renal ROS  negative genitourinary   Musculoskeletal negative musculoskeletal ROS (+)   Abdominal   Peds  Hematology negative hematology ROS (+)   Anesthesia Other Findings Day of surgery medications reviewed with patient.  Reproductive/Obstetrics negative OB ROS                            Anesthesia Physical Anesthesia Plan  ASA: II and emergent  Anesthesia Plan: General   Post-op Pain Management:    Induction: Intravenous  PONV Risk Score and Plan: 3 and Treatment may vary due to age or medical condition, Ondansetron, Dexamethasone, Midazolam and Scopolamine patch - Pre-op  Airway Management Planned: Oral ETT  Additional Equipment: None  Intra-op Plan:   Post-operative Plan: Extubation in OR  Informed Consent: I have reviewed the patients History and Physical, chart, labs and discussed the procedure including the risks, benefits and alternatives for the proposed anesthesia with the patient or authorized representative who has indicated his/her understanding and acceptance.     Dental advisory given  Plan Discussed with: CRNA  Anesthesia Plan Comments:        Anesthesia Quick Evaluation

## 2020-04-04 NOTE — Op Note (Signed)
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 History of orchitis and GERD.  Noted worsening abdominal pain starting a little more than a day ago. Crampy with decreased appetite.  Pain became focal in the right lower side.  Was seen at primary care office.  Appendicitis suspected.  Sent for CAT scan that showed a suspicion of nonperforated appendicitis. Told to go the emergency room.  Patient went to [facility] emergency room.  Procedure:  Patient is brought to the operating room and placed in a supine position on the operating room table. Following administration of general anesthesia, a time out was held and the patient's name and procedure is confirmed. Patient is then prepped and draped in the usual strict aseptic fashion.  After ascertaining that an adequate level of anesthesia has been achieved, a peri-umbilical incision is made with a #15 blade. Dissection is carried down to the fascia. Fascia is incised in the midline and the peritoneal cavity is entered cautiously. A #0-vicryl pursestring suture is placed in the fascia. An Hassan cannula is introduced under direct vision and secured with the pursestring suture. The abdomen is insufflated with carbon dioxide. The laparoscope is introduced and the abdomen is explored. Operative ports are placed in the right upper quadrant and left lower quadrant. The appendix is identified. It is adherent to the lateral aspect of the terminal ileum and mesentery.  The mesoappendix is divided with the harmonic scalpel. Dissection is carried down to the base of the appendix. The base of the appendix is dissected out  clearing the junction with the cecal wall. Using an Endo-GIA stapler, the base of the appendix is transected at the junction with the cecal wall. There is good approximation of tissue along the staple line. There is good hemostasis along the staple line. The appendix is placed into an endo-catch bag and withdrawn through the umbilical port. The #0-vicryl pursestring suture is tied securely.  Right lower quadrant is irrigated with warm saline which is evacuated. Good hemostasis is noted. Ports are removed under direct vision. Good hemostasis is noted at the port sites. Pneumoperitoneum is released.  Skin incisions are anesthetized with local anesthetic. Wounds are closed with interrupted 4-0 Monocryl subcuticular sutures. Wounds are washed and dried and Dermabond was applied. The patient is awakened from anesthesia and brought to the recovery room. The patient tolerated the procedure well.  Armandina Gemma, MD [facility] Office: [phone]

## 2020-04-04 NOTE — Anesthesia Procedure Notes (Signed)
Procedure Name: Intubation Date/Time: 04/04/2020 10:42 AM Performed by: West Pugh, CRNA Pre-anesthesia Checklist: Patient identified, Emergency Drugs available, Suction available, Patient being monitored and Timeout performed Patient Re-evaluated:Patient Re-evaluated prior to induction Oxygen Delivery Method: Circle system utilized Preoxygenation: Pre-oxygenation with 100% oxygen Induction Type: IV induction Ventilation: Mask ventilation without difficulty Laryngoscope Size: Mac and 4 Grade View: Grade I Tube type: Oral Tube size: 7.5 mm Number of attempts: 1 Airway Equipment and Method: Stylet Placement Confirmation: ETT inserted through vocal cords under direct vision,  positive ETCO2,  CO2 detector and breath sounds checked- equal and bilateral Secured at: 22 cm Tube secured with: Tape Dental Injury: Teeth and Oropharynx as per pre-operative assessment

## 2020-04-05 ENCOUNTER — Encounter (HOSPITAL_COMMUNITY): Payer: Self-pay | Admitting: Surgery

## 2020-04-05 LAB — SURGICAL PATHOLOGY

## 2020-04-05 NOTE — Discharge Summary (Signed)
[facility] Discharge Summary   Patient ID: Erik White MRN: [id number] DOB/AGE: 23/26/1997 23 y.o.  Admit date: 04/03/2020 Discharge date: 04/05/2020  Admitting Diagnosis: Acute appendicitis  Discharge Diagnosis Acute appendicitis  Consultants None   Imaging: CT Abdomen Pelvis W Contrast  Result Date: 04/03/2020 CLINICAL DATA:  Right lower quadrant pain and tenderness for the past 2 days. EXAM: CT ABDOMEN AND PELVIS WITH CONTRAST TECHNIQUE: Multidetector CT imaging of the abdomen and pelvis was performed using the standard protocol following bolus administration of intravenous contrast. CONTRAST:  160mL OMNIPAQUE IOHEXOL 300 MG/ML  SOLN COMPARISON:  CT abdomen pelvis dated January 28, 2015. FINDINGS: Lower chest: No acute abnormality. Hepatobiliary: No focal liver abnormality is seen. No gallstones, gallbladder wall thickening, or biliary dilatation. Pancreas: Unremarkable. No pancreatic ductal dilatation or surrounding inflammatory changes. Spleen: Normal in size without focal abnormality. Adrenals/Urinary Tract: Adrenal glands are unremarkable. Kidneys are normal, without renal calculi, focal lesion, or hydronephrosis. Chronic mild circumferential bladder wall thickening which may be related to underdistension. Stomach/Bowel: Dilated appendix with surrounding inflammatory changes, consistent with acute appendicitis. Appendix: Location: Right lower quadrant Diameter: 13 mm Appendicolith: Punctate appendicolith at the base (series 4, image 49) Mucosal hyper-enhancement: Present Extraluminal gas: None Periappendiceal collection: None Stomach is within normal limits. No bowel wall thickening or obstruction. Vascular/Lymphatic: No significant vascular findings are present. No enlarged abdominal or pelvic lymph nodes. Reproductive: Prostate is unremarkable. Other: No free fluid or pneumoperitoneum. Musculoskeletal: No acute or significant osseous findings. 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 IMPRESSION: 1. Acute appendicitis. No perforation or abscess. These results will be called to the ordering clinician or representative by the Radiologist Assistant, and communication documented in the PACS or [employer]. Electronically Signed   By: Titus Dubin M.D.   On: 04/03/2020 14:51    Procedures Dr. Harlow Asa (04/04/20) -Laparoscopic Appendectomy  Hospital Course:  Patient is a 24 year old male who presented to [facility] with abdominal pain.  Workup showed acute appendicitis.  Patient was admitted and underwent procedure listed above.  Tolerated procedure well and was transferred to the floor.  Diet was advanced as tolerated.  On POD#1, the patient was voiding well, tolerating diet, ambulating well, pain well controlled, vital signs stable, incisions c/d/i and felt stable for discharge home.  Patient will follow up in our office in 3 weeks and knows to call with questions or concerns.  He will call to confirm appointment date/time.    Physical Exam: General:  Alert, NAD, pleasant, comfortable Abd:  Soft, ND, mild tenderness, incisions C/D/I   I or a member of my team have reviewed this patient in the Controlled Substance Database.   Allergies as of 04/05/2020   No Known Allergies     Medication List    TAKE these medications   acetaminophen 325 MG tablet Commonly known as: TYLENOL Take 2 tablets (650 mg total) by mouth every 6 (six) hours.   amoxicillin-clavulanate 875-125 MG tablet Commonly known as: Augmentin Take 1 tablet by mouth 2 (two) times daily for 7 days.   hydrochlorothiazide 25 MG tablet Commonly known as: HYDRODIURIL Take 1 tablet (25 mg total) by mouth daily. What changed:   when to take this  reasons to take this   ibuprofen 200 MG tablet Commonly known as: ADVIL Take 2-3 tablets every 6 hours as needed to control pain.  You can alternate with plain Tylenol/acetaminophen.  You can buy both of these over the counter at any Drug  store.  oxyCODONE 5 MG immediate release tablet Commonly known as: Oxy IR/ROXICODONE You can take one tablet every 4 hours as needed for pain not relieved by plain Tylenol/acetaminophen and ibuprofen.         Follow-up Information    [facility]. Go on 04/25/2020.   Specialty: General Surgery Why: Follow up appointment scheduled for 1:30 PM. Please arrive 30 min prior to appointment time. Bring photo ID and insurance information.  Contact information: [address] [place] [address] [phone]               Signed: Brigid Re, [facility] 04/05/2020, 8:59 AM Please see Amion for pager number during day hours 7:00am-4:30pm

## 2020-04-10 ENCOUNTER — Other Ambulatory Visit: Payer: Self-pay | Admitting: *Deleted

## 2020-04-10 NOTE — Patient Outreach (Signed)
[facility]) Care Management  04/10/2020  Erik White 1996/07/19 [id number]   Transition of care telephone call  Referral received:04/09/20 Initial outreach:04/10/20 Insurance: [employer]  Initial unsuccessful telephone call to patient's preferred number in order to complete transition of care assessment; no answer, unable to leave a message as mailbox is full .   Objective: Per the electronic medical record, Erik White   was hospitalized at [facility] from 6/30-7/2   With/for Acute appendicitis, Appendectomy  . Comorbidities include:Hypertension , obesity . He was discharged to home on 04/05/20 without the need for home health services or durable medical equipment per the discharge summary.   Plan: This RNCM will route unsuccessful outreach letter with [facility] Management pamphlet and 24 hour Nurse Advice Line Magnet to [name] clinical pool to be mailed to patient's home address. This RNCM will attempt another outreach within 4 business days.  Joylene Draft, RN, BSN  [facility] Management Coordinator  [phone]- Mobile [phone]- Toll Free Main Office

## 2020-04-15 ENCOUNTER — Encounter: Payer: Self-pay | Admitting: *Deleted

## 2020-04-15 ENCOUNTER — Other Ambulatory Visit: Payer: Self-pay | Admitting: *Deleted

## 2020-04-15 NOTE — Patient Outreach (Signed)
[facility]) Care Management  04/15/2020  ORBIE GRUPE 04/26/1996 [id number]   Transition of care call/case closure   Referral received:04/09/20 Initial outreach:04/10/20 Insurance:  [employer]   Subjective: 2nd call  successful outreach  call to patient's preferred number in order to complete transition of care assessment; 2 HIPAA identifiers verified. Explained purpose of call and completed transition of care assessment.  Darry states that he is doing fine, he has already returned to work. He denies post-operative problems, says surgical incisions are unremarkable, states surgical pain well managed with prescribed medications using occasional advil, tolerating diet, denies bowel or bladder problems.   He does not  use a [facility] outpatient pharmacy.     Objective:  Jahfari Ambers   was hospitalized at [facility] from 6/30-7/2   With/for Acute appendicitis, Appendectomy  . Comorbidities include:Hypertension , obesity . He was discharged to home on 04/05/20 without the need for home health services or durable medical equipment per the discharge summary.   Assessment:  Patient voices good understanding of all discharge instructions.  See transition of care flowsheet for assessment details.   Plan:  Reviewed hospital discharge diagnosis of Appendectomy   and discharge treatment plan using hospital discharge instructions, assessing medication adherence, reviewing problems requiring provider notification, and discussing the importance of follow up with surgeon, primary care provider and/or specialists as directed.   No ongoing care management needs identified so will close case to [facility] Management services.  Patient was routed [facility] outreach letter on initial call attempt on 04/10/20.   Joylene Draft, RN, BSN  [facility] Management Coordinator  [phone]- Mobile [phone]- Toll Free Main Office

## 2020-04-25 ENCOUNTER — Other Ambulatory Visit: Payer: Self-pay

## 2020-04-25 ENCOUNTER — Encounter: Payer: Self-pay | Admitting: Family Medicine

## 2020-04-25 ENCOUNTER — Ambulatory Visit (INDEPENDENT_AMBULATORY_CARE_PROVIDER_SITE_OTHER): Payer: No Typology Code available for payment source | Admitting: Family Medicine

## 2020-04-25 VITALS — BP 116/70 | HR 69 | Temp 98.4°F | Wt 257.0 lb

## 2020-04-25 DIAGNOSIS — H9222 Otorrhagia, left ear: Secondary | ICD-10-CM | POA: Diagnosis not present
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
## 2020-04-25 NOTE — Patient Instructions (Signed)
Eardrum Rupture, Adult  An eardrum rupture is a hole (perforation) in the eardrum. The eardrum is a thin, round tissue inside of the ear that separates the ear canal from the middle ear. The eardrum is also called the tympanic membrane. It transfers sound vibrations through small bones in the middle ear to the hearing nerve in the inner ear. It also protects the middle ear from germs. An eardrum rupture can cause pain and hearing loss. What are the causes? This condition may be caused by:  An infection.  A sudden injury, such as from: ? Inserting a thin, sharp object into the ear. ? A hit to the side of the head, especially by an open hand. ? Falling onto water or a flat surface. ? A rapid change in pressure, such as from flying or scuba diving. ? A sudden increase in pressure against the eardrum, such as from an explosion or a very loud noise.  Inserting a cotton-tipped swab in the ear.  A long-term eustachian tube disorder. Eustachian tubes are parts of the body that connect each middle ear space to the back of the nose.  A medical procedure or surgery, such as a procedure to remove wax from the ear canal.  Removing a man-made pressure equalization tube(PE tube) that was placed through the eardrum.  Having a PE tube fall out. What increases the risk? You are more likely to develop this condition if:  You have had PE tubes inserted in your ears.  You have an ear infection.  You play sports that: ? Involve balls or contact with other players. ? Take place in water, such as diving, scuba diving, or waterskiing. What are the signs or symptoms? Symptoms of this condition include:  Sudden pain at the time of the injury.  Ear pain that suddenly improves.  Ringing in the ear after the injury.  Drainage from the ear. The drainage may be clear, cloudy or pus-like, or bloody.  Hearing loss.  Dizziness. How is this diagnosed? This condition is diagnosed based on your symptoms  and medical history as well as a physical exam. Your health care provider can usually see a perforation using an ear scope (otoscope). You may have tests, such as:  A hearing test (audiogram) to check for hearing loss.  A test in which a sample of ear drainage is tested for infection (culture). How is this treated? An eardrum typically heals on its own within a few weeks. If your eardrum does not heal, your health care provider may recommend a procedure to place a patch over your eardrum or surgery to repair your eardrum. Your health care provider may also prescribe antibiotic medicines to help prevent infection. If the ear heals completely, any hearing loss should be temporary. Follow these instructions at home:  Keep your ear dry. This is very important. Follow instructions from your health care provider about how to keep your ear dry. You may need to wear waterproof earplugs when bathing and swimming.  Take over-the-counter and prescription medicines only as told by your health care provider.  Return to sports and activities as told by your health care provider. Ask your health care provider what activities are safe for you.  Wear headgear with ear protection when you play sports in which ear injuries are common.  If directed, apply heat to your affected ear as often as told by your health care provider. Use the heat source that your health care provider recommends, such as a moist heat pack   or a heating pad. This will help to relieve pain. ? Place a towel between your skin and the heat source. ? Leave the heat on for 20-30 minutes. ? Remove the heat if your skin turns bright red. This is especially important if you are unable to feel pain, heat, or cold. You may have a greater risk of getting burned.  Keep all follow-up visits as told by your health care provider. This is important.  Talk to your health care provider before traveling by plane. Contact a health care provider if:  You  have mucus or blood draining from your ear.  You have a fever.  You have ear pain.  You have hearing loss, dizziness, or ringing in your ear. Get help right away if:  You have sudden hearing loss.  You are very dizzy.  You have severe ear pain.  Your face feels weak or becomes paralyzed. Summary  An eardrum rupture is a hole (perforation) in the eardrum that can cause pain and hearing loss. It is usually caused by a sudden injury to the ear.  The eardrum will likely heal on its own within a few weeks. In some cases, surgery may be necessary.  After the injury, follow instructions from your health care provider about how to keep your ear dry as it heals. This information is not intended to replace advice given to you by your health care provider. Make sure you discuss any questions you have with your health care provider. Document Revised: 09/03/2017 Document Reviewed: 11/27/2016 Elsevier Patient Education  2020 [employer]  

## 2020-04-25 NOTE — Assessment & Plan Note (Signed)
There is a small clot adjacent to TM and difficult to fully visualize, but bleeding appears to be coming from TM.  This, along with the diminished hearing makes me think he may have perforation.   Urgent referral placed to ENT.

## 2020-04-26 ENCOUNTER — Encounter: Payer: Self-pay | Admitting: Family Medicine

## 2020-05-31 ENCOUNTER — Ambulatory Visit (INDEPENDENT_AMBULATORY_CARE_PROVIDER_SITE_OTHER): Payer: No Typology Code available for payment source | Admitting: Otolaryngology

## 2020-05-31 ENCOUNTER — Other Ambulatory Visit: Payer: Self-pay

## 2020-05-31 ENCOUNTER — Encounter (INDEPENDENT_AMBULATORY_CARE_PROVIDER_SITE_OTHER): Payer: Self-pay | Admitting: Otolaryngology

## 2020-05-31 VITALS — Temp 97.3°F

## 2020-05-31 DIAGNOSIS — S00412A Abrasion of left ear, initial encounter: Secondary | ICD-10-CM
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
## 2020-10-28 ENCOUNTER — Encounter: Payer: Self-pay | Admitting: Physician Assistant

## 2020-10-28 ENCOUNTER — Telehealth (INDEPENDENT_AMBULATORY_CARE_PROVIDER_SITE_OTHER): Payer: No Typology Code available for payment source | Admitting: Physician Assistant

## 2020-10-28 VITALS — BP 135/80 | HR 117 | Temp 99.5°F

## 2020-10-28 DIAGNOSIS — R0602 Shortness of breath: Secondary | ICD-10-CM

## 2020-10-28 DIAGNOSIS — Z20822 Contact with and (suspected) exposure to covid-19: Secondary | ICD-10-CM | POA: Diagnosis not present

## 2020-10-28 DIAGNOSIS — J329 Chronic sinusitis, unspecified: Secondary | ICD-10-CM

## 2020-10-28 DIAGNOSIS — R059 Cough, unspecified: Secondary | ICD-10-CM

## 2020-10-28 DIAGNOSIS — R000 Tachycardia, unspecified: Secondary | ICD-10-CM

## 2020-10-28 DIAGNOSIS — J40 Bronchitis, not specified as acute or chronic: Secondary | ICD-10-CM

## 2020-10-28 MED ORDER — ALBUTEROL SULFATE HFA 108 (90 BASE) MCG/ACT IN AERS: 2.0000 | INHALATION_SPRAY | Freq: Four times a day (QID) | RESPIRATORY_TRACT | 0 refills | Status: DC | PRN

## 2020-10-28 MED ORDER — PREDNISONE 50 MG PO TABS: ORAL_TABLET | ORAL | 0 refills | Status: DC

## 2020-10-28 MED ORDER — AZITHROMYCIN 250 MG PO TABS: ORAL_TABLET | ORAL | 0 refills | Status: DC

## 2020-10-28 NOTE — Progress Notes (Signed)
Patient ID: Erik White, male   DOB: 12-Apr-1996, 25 y.o.   MRN: [id number] .Marland KitchenVirtual Visit via Video Note  I connected with Erik White on 10/28/20 at 10:10 AM EST by a video enabled telemedicine application and verified that I am speaking with the correct person using two identifiers.  Location: Patient: work Provider: clinic  .Marland KitchenParticipating in visit:  Patient: Damani Provider: Iran Planas PA-C Provider in training: Martin Majestic PA-Student   I discussed the limitations of evaluation and management by telemedicine and the availability of in person appointments. The patient expressed understanding and agreed to proceed.  History of Present Illness: Pt is a 25 yo male with URI symptoms on Day 6. He has taken 2 covid test that were negative. His girlfriend has also been sick. He continues to worsen.  He reports cough, shortness of breath, headache, low-grade fever, chills, fatigue, no appetite, sinus pressure and congestion.  He did go back to work today.  Patient is taking Mucinex, ibuprofen and Tylenol with minimal benefits. Pt is not vaccinated.     .. Active Ambulatory Problems    Diagnosis Date Noted  . Breast mass in male 04/20/2016  . 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 Patellar tendonitis of left knee 04/20/2016  . SLAP lesion of shoulder 05/01/2016  . 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 Essential hypertension 05/25/2019  . Right lower quadrant abdominal pain 04/03/2020  . Acute appendicitis with localized peritonitis 04/03/2020  . Obesity (BMI 30-39.9) 04/03/2020  . Ear bleeding, left 04/25/2020   Resolved Ambulatory Problems    Diagnosis Date Noted  . 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 Elevated TSH 04/28/2016  . Right shoulder pain 04/27/2017  . Scapular dysfunction 04/27/2017  . Acute appendicitis 04/03/2020   Past Medical History:  Diagnosis Date  . GERD (gastroesophageal reflux disease)   . Headache   . Mesenteric adenitis 2005   Reviewed med,  allergy, problem list.    Observations/Objective: No acute distress Flushed cheeks Normal breathing. No labored breathing.  Productive cough  .Marland Kitchen Today's Vitals   10/28/20 1021  BP: 135/80  Pulse: (!) 117  Temp: 99.5 F (37.5 C)  TempSrc: Oral  SpO2: 97%   There is no height or weight on file to calculate BMI.       Assessment and Plan: Marland KitchenMarland KitchenAscher was seen today for fever.  Diagnoses and all orders for this visit:  Suspected COVID-19 virus infection -     albuterol (VENTOLIN HFA) 108 (90 Base) MCG/ACT inhaler; Inhale 2 puffs into the lungs every 6 (six) hours as needed for shortness of breath. -     Novel Coronavirus, NAA ([facility])  Sinobronchitis -     azithromycin (ZITHROMAX Z-PAK) 250 MG tablet; Take 2 tablets (500 mg) on  Day 1,  followed by 1 tablet (250 mg) once daily on Days 2 through 5. -     predniSONE (DELTASONE) 50 MG tablet; One tab PO daily for 5 days. -     albuterol (VENTOLIN HFA) 108 (90 Base) MCG/ACT inhaler; Inhale 2 puffs into the lungs every 6 (six) hours as needed for shortness of breath.  Cough -     Novel Coronavirus, NAA ([facility])  Shortness of breath -     Novel Coronavirus, NAA ([facility])   I do think pt needs to be tested for covid with PCR despite to rapid negative Covid test.  Patient should quarantine until he is asymptomatic and or Covid results are in.  Discussed to continue symptomatic care.  He is at 1 week mark.  Treated for sinobronchitis with Z-Pak and prednisone.  Follow-up as needed or if symptoms worsen.  Sudden shortness of breath or chest pain go to emergency room.   Follow Up Instructions:    I discussed the assessment and treatment plan with the patient. The patient was provided an opportunity to ask questions and all were answered. The patient agreed with the plan and demonstrated an understanding of the instructions.   The patient was advised to call back or seek an in-person evaluation if the symptoms worsen or if the  condition fails to improve as anticipated.   Iran Planas, PA-C

## 2020-10-28 NOTE — Progress Notes (Deleted)
Sore throat  Fever  Chills Cough

## 2020-10-30 LAB — NOVEL CORONAVIRUS, NAA: SARS-CoV-2, NAA: DETECTED — AB

## 2020-10-30 LAB — SARS-COV-2, NAA 2 DAY TAT

## 2020-10-30 NOTE — Progress Notes (Signed)
Positive for covid. You were still symptomatic so need to quarantine for 10 days from symptoms. Call with any concerning worsening symptoms but treatment plan as discussed in virtual visit.

## 2021-02-01 ENCOUNTER — Ambulatory Visit
Admission: RE | Admit: 2021-02-01 | Discharge: 2021-02-01 | Disposition: A | Discharge status: Home / Self Care | Payer: No Typology Code available for payment source | Source: Ambulatory Visit | Attending: Emergency Medicine | Admitting: Emergency Medicine

## 2021-02-01 ENCOUNTER — Other Ambulatory Visit: Payer: Self-pay

## 2021-02-01 ENCOUNTER — Ambulatory Visit (INDEPENDENT_AMBULATORY_CARE_PROVIDER_SITE_OTHER): Payer: No Typology Code available for payment source

## 2021-02-01 ENCOUNTER — Ambulatory Visit: Payer: No Typology Code available for payment source

## 2021-02-01 VITALS — BP 121/80 | HR 75 | Temp 98.3°F | Resp 16

## 2021-02-01 DIAGNOSIS — S8011XA Contusion of right lower leg, initial encounter: Secondary | ICD-10-CM | POA: Diagnosis not present

## 2021-02-01 DIAGNOSIS — M79604 Pain in right leg: Secondary | ICD-10-CM | POA: Diagnosis not present

## 2021-02-01 MED ORDER — MELOXICAM 15 MG PO TABS
15.0000 mg | ORAL_TABLET | Freq: Every day | ORAL | 0 refills | Status: DC
Start: 2021-02-01 — End: 2021-08-06
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
## 2021-02-01 NOTE — ED Provider Notes (Signed)
[place]   [id number] 02/01/21 Arrival Time: 9509  CC: RT LE PAIN  SUBJECTIVE: History from: patient. Erik White is a 25 y.o. male complains of RT LE pain and injury x few days.  Kneed in LE while playing basketball.  Has bruising and swelling.  Localizes the pain to the inside of LE.  Describes the pain as intermittent and 3/10 in character.  Has tried OTC medications without relief.  Symptoms are made worse with weight-bearing/ walking.  Denies similar symptoms in the past.  Denies fever, chills, erythema, weakness, numbness and tingling.    ROS: As per HPI.  All other pertinent ROS negative.     Past Medical History:  Diagnosis Date  . GERD (gastroesophageal reflux disease)   . Headache    migraines  . Mesenteric adenitis 2005  . 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 APPENDECTOMY    . LAPAROSCOPIC APPENDECTOMY N/A 04/04/2020   Procedure: APPENDECTOMY LAPAROSCOPIC;  Surgeon: Armandina Gemma, MD;  Location: [facility];  Service: General;  Laterality: N/A;  . SHOULDER ARTHROSCOPY WITH LABRAL REPAIR Left 05/28/2016   Procedure: LEFT SHOULDER ARTHROSCOPY WITH ANTERIOR AND POSTERIOR CAPSULOLABRAL RECONSTRUCTION;  Surgeon: Justice Britain, MD;  Location: [place];  Service: Orthopedics;  Laterality: Left;  . WISDOM TOOTH EXTRACTION     No Known Allergies No current facility-administered medications on file prior to encounter.   Current Outpatient Medications on File Prior to Encounter  Medication Sig Dispense Refill  . ibuprofen (ADVIL) 200 MG tablet Take 2-3 tablets every 6 hours as needed to control pain.  You can alternate with plain Tylenol/acetaminophen.  You can buy both of these over the counter at any Drug store.    . hydrochlorothiazide (HYDRODIURIL) 25 MG tablet Take 1 tablet (25 mg total) by mouth daily. (Patient taking differently: Take 25 mg by mouth daily as needed (when blood pressure gets elevated).) 90 tablet 1  .  [DISCONTINUED] albuterol (VENTOLIN HFA) 108 (90 Base) MCG/ACT inhaler Inhale 2 puffs into the lungs every 6 (six) hours as needed for shortness of breath. 6.7 g 0   Social History   Socioeconomic History  . Marital status: Single    Spouse name: Not on file  . Number of children: Not on file  . Years of education: Not on file  . Highest education level: Not on file  Occupational History  . Not on file  Tobacco Use  . Smoking status: Never Smoker  . Smokeless tobacco: Never Used  Vaping Use  . Vaping Use: Never used  Substance and Sexual Activity  . Alcohol use: No  . Drug use: No  . Sexual activity: Not on file  Other Topics Concern  . Not on file  Social History Narrative  . Not on file   Social Determinants of Health   Financial Resource Strain: Not on file  Food Insecurity: Not on file  Transportation Needs: Not on file  Physical Activity: Not on file  Stress: Not on file  Social Connections: Not on file  Intimate Partner Violence: Not on file   Family History  Problem Relation Age of Onset  . Heart attack Father   . Cancer Other        lung  . Cancer Other        liver CA  . Thyroid disease Mother   . Hypertension Mother     OBJECTIVE:  Vitals:   02/01/21 1404  BP: 121/80  Pulse:  75  Resp: 16  Temp: 98.3 F (36.8 C)  TempSrc: Oral  SpO2: 97%    General appearance: ALERT; in no acute distress.  Head: NCAT Lungs: Normal respiratory effort Musculoskeletal: RT LE Inspection: Dark ecchymosis over proximal medial aspect of RLE Palpation: TTP over proximal medial aspect of RLE along tibia Strength: 5/5 knee flexion, 5/5 knee extension, 5/5 dorsiflexion, 5/5 plantar flexion Skin: warm and dry Neurologic: Ambulates without difficulty; Sensation intact about the lower extremities Psychological: alert and cooperative; normal mood and affect  DIAGNOSTIC STUDIES:  DG Tibia/Fibula Right  Result Date: 02/01/2021 CLINICAL DATA:  Right lower leg bruising.  EXAM: RIGHT TIBIA AND FIBULA - 2 VIEW COMPARISON:  None. FINDINGS: There is no evidence of fracture or other focal bone lesions. Soft tissues are unremarkable. IMPRESSION: No acute bone abnormality to the right lower leg. Electronically Signed   By: Markus Daft M.D.   On: 02/01/2021 14:45     X-rays negative for bony abnormalities including fracture, or dislocation.  No soft tissue swelling.    I have reviewed the x-rays myself and the radiologist interpretation. I am in agreement with the radiologist interpretation.     ASSESSMENT & PLAN:  1. Acute pain of right lower extremity   2. Pain of right lower extremity due to injury     Meds ordered this encounter  Medications  . meloxicam (MOBIC) 15 MG tablet    Sig: Take 1 tablet (15 mg total) by mouth daily.    Dispense:  20 tablet    Refill:  0    Order Specific Question:   Supervising Provider    Answer:   Raylene Everts [[id number]]   X-ray negative for fracture or dislocation Continue conservative management of rest, ice, and gentle stretches Take mobic as needed for pain relief (may cause abdominal discomfort, ulcers, and GI bleeds avoid taking with other NSAIDs) Follow up with PCP if symptoms persist Return or go to the ER if you have any new or worsening symptoms (fever, chills, chest pain, redness, swelling, bruising, deformity, etc...)    Reviewed expectations re: course of current medical issues. Questions answered. Outlined signs and symptoms indicating need for more acute intervention. Patient verbalized understanding. After Visit Summary given.    Lestine Box, PA-C 02/01/21 1454

## 2021-02-01 NOTE — Discharge Instructions (Addendum)
X-ray negative for fracture or dislocation Continue conservative management of rest, ice, and gentle stretches Take mobic as needed for pain relief (may cause abdominal discomfort, ulcers, and GI bleeds avoid taking with other NSAIDs) Follow up with PCP if symptoms persist Return or go to the ER if you have any new or worsening symptoms (fever, chills, chest pain, redness, swelling, bruising, deformity, etc...)

## 2021-08-06 ENCOUNTER — Other Ambulatory Visit: Payer: Self-pay

## 2021-08-06 ENCOUNTER — Emergency Department (INDEPENDENT_AMBULATORY_CARE_PROVIDER_SITE_OTHER)
Admission: EM | Admit: 2021-08-06 | Discharge: 2021-08-06 | Disposition: A | Discharge status: Home / Self Care | Payer: No Typology Code available for payment source | Source: Home / Self Care | Attending: Family Medicine | Admitting: Family Medicine

## 2021-08-06 ENCOUNTER — Encounter: Payer: Self-pay | Admitting: Emergency Medicine

## 2021-08-06 DIAGNOSIS — J029 Acute pharyngitis, unspecified: Secondary | ICD-10-CM | POA: Diagnosis not present

## 2021-08-06 HISTORY — DX: Essential (primary) hypertension: I10

## 2021-08-06 LAB — POC SARS CORONAVIRUS 2 AG -  ED: SARS Coronavirus 2 Ag: NEGATIVE

## 2021-08-06 LAB — POCT INFLUENZA A/B
Influenza A, POC: NEGATIVE
Influenza B, POC: NEGATIVE

## 2021-08-06 NOTE — Discharge Instructions (Signed)
The COVID test and influenza tests are negative You have a viral illness Stay home take Tylenol or ibuprofen for pain and fever.  May gargle with salt water to help with sore throat Call or return if not improving by the weekend

## 2021-08-06 NOTE — ED Triage Notes (Addendum)
Pt c/o sore throat x3 days. States yesterday he had some intermittent chest pain. States temp has been 99. No meds. Also states his pulse has been elevated in the low 100

## 2021-08-06 NOTE — ED Provider Notes (Signed)
[name]    CSN: [id number] Arrival date & time: 08/06/21  1330      History   Chief Complaint Chief Complaint  Patient presents with   Sore Throat    HPI Erik White is a 25 y.o. male.   HPI  Patient has a sore throat for 3 days.  He has some chest pressure and shortness of breath, pain with deep breath.  He has had a cough and some sinus congestion as well.  He states his pulse has been over 100 for the last 3 days.  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   Patellar tendonitis of left knee 04/20/2016    Past Surgical History:  Procedure Laterality Date   APPENDECTOMY     LAPAROSCOPIC APPENDECTOMY N/A 04/04/2020   Procedure: APPENDECTOMY LAPAROSCOPIC;  Surgeon: Armandina Gemma, MD;  Location: [facility];  Service: General;  Laterality: N/A;   SHOULDER ARTHROSCOPY WITH LABRAL REPAIR Left 05/28/2016   Procedure: LEFT SHOULDER ARTHROSCOPY WITH ANTERIOR AND POSTERIOR CAPSULOLABRAL RECONSTRUCTION;  Surgeon: Justice Britain, MD;  Location: [place];  Service: Orthopedics;  Laterality: Left;   WISDOM TOOTH EXTRACTION         Home Medications    Prior to Admission medications   Medication Sig Start Date End Date Taking? Authorizing Provider  albuterol (VENTOLIN HFA) 108 (90 Base) MCG/ACT inhaler Inhale 2 puffs into the lungs every 6 (six) hours as needed for shortness of  breath. 10/28/20 02/01/21  Donella Stade, PA-C    Family History Family History  Problem Relation Age of Onset   Heart attack Father    Cancer Other        lung   Cancer Other        liver CA   Thyroid disease Mother    Hypertension Mother     Social History Social History   Tobacco Use   Smoking status: Never   Smokeless tobacco: Never  Vaping Use   Vaping Use: Never used  Substance Use Topics   Alcohol use: No   Drug use: No     Allergies   Patient has no known allergies.   Review of Systems Review of Systems  See HPI Physical Exam Triage Vital Signs ED Triage Vitals  Enc Vitals Group     BP 08/06/21 1349 128/85     Pulse Rate 08/06/21 1349 (!) 102     Resp 08/06/21 1349 18     Temp 08/06/21 1349 98.5 F (36.9 C)     Temp Source 08/06/21 1349 Oral     SpO2 08/06/21 1349 99 %     Weight 08/06/21 1350 265 lb (120.2 kg)     Height --      Head Circumference --      Peak Flow --      Pain Score 08/06/21 1349 6  Pain Loc --      Pain Edu? --      Excl. in [place]? --    No data found.  Updated Vital Signs BP 128/85 (BP Location: Right Arm)   Pulse (!) 102   Temp 98.5 F (36.9 C) (Oral)   Resp 18   Wt 120.2 kg   SpO2 99%   BMI 34.96 kg/m       Physical Exam Constitutional:      General: He is not in acute distress.    Appearance: He is well-developed. He is obese.  HENT:     Head: Normocephalic and atraumatic.     Right Ear: Tympanic membrane and ear canal normal.     Left Ear: Tympanic membrane and ear canal normal.     Nose: Congestion and rhinorrhea present.     Mouth/Throat:     Pharynx: Posterior oropharyngeal erythema present.  Eyes:     Conjunctiva/sclera: Conjunctivae normal.     Pupils: Pupils are equal, round, and reactive to light.  Cardiovascular:     Rate and Rhythm: Normal rate and regular rhythm.     Heart sounds: Normal heart sounds.  Pulmonary:     Effort: Pulmonary effort is normal. No respiratory distress.      Breath sounds: No wheezing or rhonchi.  Abdominal:     General: There is no distension.     Palpations: Abdomen is soft.  Musculoskeletal:        General: Normal range of motion.     Cervical back: Normal range of motion.  Lymphadenopathy:     Cervical: No cervical adenopathy.  Skin:    General: Skin is warm and dry.  Neurological:     Mental Status: He is alert.     UC Treatments / Results  Labs (all labs ordered are listed, but only abnormal results are displayed) Labs Reviewed  POCT INFLUENZA A/B  POC SARS CORONAVIRUS 2 AG -  ED    EKG   Radiology No results found.  Procedures Procedures (including critical care time)  Medications Ordered in UC Medications - No data to display  Initial Impression / Assessment and Plan / UC Course  I have reviewed the triage vital signs and the nursing notes.  Pertinent labs & imaging results that were available during my care of the patient were reviewed by me and considered in my medical decision making (see chart for details).     Influenza test is negative COVID test is negative Reviewed symptomatic care of viral illness Final Clinical Impressions(s) / UC Diagnoses   Final diagnoses:  Viral pharyngitis     Discharge Instructions      The COVID test and influenza tests are negative You have a viral illness Stay home take Tylenol or ibuprofen for pain and fever.  May gargle with salt water to help with sore throat Call or return if not improving by the weekend   ED Prescriptions   None    PDMP not reviewed this encounter.   Raylene Everts, MD 08/06/21 [phone]

## 2021-12-12 ENCOUNTER — Other Ambulatory Visit: Payer: Self-pay

## 2021-12-12 ENCOUNTER — Emergency Department
Admission: RE | Admit: 2021-12-12 | Discharge: 2021-12-12 | Disposition: A | Discharge status: Home / Self Care | Payer: No Typology Code available for payment source | Source: Ambulatory Visit

## 2021-12-12 VITALS — BP 147/82 | HR 78 | Temp 98.7°F | Resp 14

## 2021-12-12 DIAGNOSIS — L237 Allergic contact dermatitis due to plants, except food: Secondary | ICD-10-CM

## 2021-12-12 MED ORDER — PREDNISONE 20 MG PO TABS: ORAL_TABLET | ORAL | 0 refills | Status: DC

## 2021-12-12 MED ORDER — METHYLPREDNISOLONE SODIUM SUCC 125 MG IJ SOLR
125.0000 mg | Freq: Once | INTRAMUSCULAR | Status: AC
  Administered 2021-12-12: 125 mg via INTRAMUSCULAR

## 2021-12-12 NOTE — ED Provider Notes (Signed)
?[facility] ? ? ? ?CSN: [id number] ?Arrival date & time: 12/12/21  1442 ? ? ?  ? ?History   ?Chief Complaint ?Chief Complaint  ?Patient presents with  ? Rash  ? ? ?HPI ?Erik White is a 26 y.o. male.  ? ?HPI 26 year old male presents with pruritic rash of bilateral arms x3 days.  Patient reports working outside recently and is concerned with possible poison ivy/poison oak. ? ?Past Medical History:  ?Diagnosis Date  ? GERD (gastroesophageal reflux disease)   ? Headache   ? migraines  ? Hypertension   ? Mesenteric adenitis 2005  ? 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 ? ?Patient Active Problem List  ? Diagnosis Date Noted  ? Ear bleeding, left 04/25/2020  ? Right lower quadrant abdominal pain 04/03/2020  ? Acute appendicitis with localized peritonitis 04/03/2020  ? Obesity (BMI 30-39.9) 04/03/2020  ? Essential hypertension 05/25/2019  ? 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 SLAP lesion of shoulder 05/01/2016  ? Breast mass in male 04/20/2016  ? 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 Patellar tendonitis of left knee 04/20/2016  ? ? ?Past Surgical History:  ?Procedure Laterality Date  ? APPENDECTOMY    ? LAPAROSCOPIC APPENDECTOMY N/A 04/04/2020  ? Procedure: APPENDECTOMY LAPAROSCOPIC;  Surgeon: Armandina Gemma, MD;  [facility];  Service: General;  Laterality: N/A;  ? SHOULDER ARTHROSCOPY WITH LABRAL REPAIR Left 05/28/2016  ? Procedure: LEFT SHOULDER ARTHROSCOPY WITH ANTERIOR AND POSTERIOR CAPSULOLABRAL RECONSTRUCTION;  Surgeon: Justice Britain, MD;  Location: [place];  Service: Orthopedics;  Laterality: Left;  ? WISDOM TOOTH EXTRACTION    ? ? ? ? ? ?Home Medications   ? ?Prior to Admission medications   ?Medication Sig Start Date End Date Taking? Authorizing Provider  ?hydrochlorothiazide (HYDRODIURIL) 12.5 MG tablet Take 12.5 mg by mouth daily.   Yes [provider]  ?predniSONE (DELTASONE) 20 MG tablet Take 3 tabs PO daily x 3 days, then 2 tabs PO daily x 3 days, then 1 tab PO daily x 3 days 12/12/21  Yes  Eliezer Lofts, FNP  ?albuterol (VENTOLIN HFA) 108 (90 Base) MCG/ACT inhaler Inhale 2 puffs into the lungs every 6 (six) hours as needed for shortness of breath. 10/28/20 02/01/21  Donella Stade, PA-C  ? ? ?Family History ?Family History  ?Problem Relation Age of Onset  ? Heart attack Father   ? Cancer Other   ?     lung  ? Cancer Other   ?     liver CA  ? Thyroid disease Mother   ? Hypertension Mother   ? ? ?Social History ?Social History  ? ?Tobacco Use  ? Smoking status: Never  ? Smokeless tobacco: Never  ?Vaping Use  ? Vaping Use: Never used  ?Substance Use Topics  ? Alcohol use: No  ? Drug use: No  ? ? ? ?Allergies   ?Patient has no known allergies. ? ? ?Review of Systems ?Review of Systems  ?Skin:  Positive for rash.  ? ? ?Physical Exam ?Triage Vital Signs ?ED Triage Vitals  ?Enc Vitals Group  ?   BP 12/12/21 1449 (!) 147/82  ?   Pulse Rate 12/12/21 1449 78  ?   Resp 12/12/21 1449 14  ?   Temp 12/12/21 1449 98.7 ?F (37.1 ?C)  ?   Temp Source 12/12/21 1449 Oral  ?   SpO2 12/12/21 1449 100 %  ?   Weight --   ?   Height --   ?   Head  Circumference --   ?   Peak Flow --   ?   Pain Score 12/12/21 1450 0  ?   Pain Loc --   ?   Pain Edu? --   ?   Excl. in [place]? --   ? ?No data found. ? ?Updated Vital Signs ?BP (!) 147/82 (BP Location: Left Arm)   Pulse 78   Temp 98.7 ?F (37.1 ?C) (Oral)   Resp 14   SpO2 100%  ? ?  ? ?Physical Exam ?Vitals and nursing note reviewed.  ?Constitutional:   ?   Appearance: Normal appearance. He is obese.  ?HENT:  ?   Head: Normocephalic and atraumatic.  ?   Mouth/Throat:  ?   Mouth: Mucous membranes are moist.  ?   Pharynx: Oropharynx is clear.  ?Eyes:  ?   Extraocular Movements: Extraocular movements intact.  ?   Conjunctiva/sclera: Conjunctivae normal.  ?   Pupils: Pupils are equal, round, and reactive to light.  ?Cardiovascular:  ?   Rate and Rhythm: Normal rate and regular rhythm.  ?   Pulses: Normal pulses.  ?   Heart sounds: Normal heart sounds. No murmur heard. ?Pulmonary:  ?    Effort: Pulmonary effort is normal.  ?   Breath sounds: Normal breath sounds. No wheezing, rhonchi or rales.  ?Musculoskeletal:  ?   Cervical back: Normal range of motion and neck supple.  ?Skin: ?   General: Skin is warm and dry.  ?   Comments: Face (lower left lip vermilion border, right superior orbit area), neck, chest, abdomen, upper/lower arms, groin/genitalia, upper/lower legs (anterior/posterior aspects): Pruritic erythematous maculopapular eruption, with grouped weeping linear vesicular lesions noted.  ?Neurological:  ?   General: No focal deficit present.  ?   Mental Status: He is alert and oriented to person, place, and time. Mental status is at baseline.  ? ? ? ?UC Treatments / Results  ?Labs ?(all labs ordered are listed, but only abnormal results are displayed) ?Labs Reviewed - No data to display ? ?EKG ? ? ?Radiology ?No results found. ? ?Procedures ?Procedures (including critical care time) ? ?Medications Ordered in UC ?Medications  ?methylPREDNISolone sodium succinate (SOLU-MEDROL) 125 mg/2 mL injection 125 mg (125 mg Intramuscular Given 12/12/21 1518)  ? ? ?Initial Impression / Assessment and Plan / UC Course  ?I have reviewed the triage vital signs and the nursing notes. ? ?Pertinent labs & imaging results that were available during my care of the patient were reviewed by me and considered in my medical decision making (see chart for details). ? ?  ? ?MDM: 1.  Poison ivy dermatitis-IM Solu-Medrol 125 mg given once in clinic prior to discharge, Rx'd prednisone taper to start tomorrow. Instructed patient to start oral prednisone taper tomorrow morning, Saturday, 12/13/2021.  Advised patient to take medication with food to completion.  Encouraged patient increase daily water intake while taking this medication.  Advised/encouraged patient to change bed linens for the next 3 days to avoid recontamination.  Patient discharged home, hemodynamically stable. ?Final Clinical Impressions(s) / UC Diagnoses   ? ?Final diagnoses:  ?Poison ivy dermatitis  ? ? ? ?Discharge Instructions   ? ?  ?Instructed patient to start oral prednisone taper tomorrow morning, Saturday, 12/13/2021.  Advised patient to take medication with food to completion.  Encouraged patient increase daily water intake while taking this medication.  Advised/encouraged patient to change bed linens for the next 3 days to avoid recontamination. ? ? ? ? ?ED Prescriptions   ? ?  Medication Sig Dispense Auth. Provider  ? predniSONE (DELTASONE) 20 MG tablet Take 3 tabs PO daily x 3 days, then 2 tabs PO daily x 3 days, then 1 tab PO daily x 3 days 18 tablet Eliezer Lofts, FNP  ? ?  ? ?PDMP not reviewed this encounter. ?  ?Eliezer Lofts, Cecilia ?12/12/21 1524 ? ?

## 2021-12-12 NOTE — ED Triage Notes (Signed)
Pt presents with a rash on bilateral arms x 3 days. Pt recently was working outside and is concerned for poison oak/ivy ?

## 2021-12-12 NOTE — Discharge Instructions (Addendum)
Instructed patient to start oral prednisone taper tomorrow morning, Saturday, 12/13/2021.  Advised patient to take medication with food to completion.  Encouraged patient increase daily water intake while taking this medication.  Advised/encouraged patient to change bed linens for the next 3 days to avoid recontamination. ?

## 2021-12-30 ENCOUNTER — Encounter: Payer: Self-pay | Admitting: Family Medicine

## 2021-12-30 ENCOUNTER — Ambulatory Visit (INDEPENDENT_AMBULATORY_CARE_PROVIDER_SITE_OTHER): Payer: No Typology Code available for payment source | Admitting: Family Medicine

## 2021-12-30 ENCOUNTER — Other Ambulatory Visit: Payer: Self-pay

## 2021-12-30 VITALS — BP 126/77 | HR 151 | Ht 73.0 in | Wt 271.0 lb

## 2021-12-30 DIAGNOSIS — I10 Essential (primary) hypertension: Secondary | ICD-10-CM | POA: Diagnosis not present

## 2021-12-30 DIAGNOSIS — Z8249 Family history of ischemic heart disease and other diseases of the circulatory system: Secondary | ICD-10-CM

## 2021-12-30 DIAGNOSIS — R079 Chest pain, unspecified: Secondary | ICD-10-CM

## 2021-12-30 DIAGNOSIS — L309 Dermatitis, unspecified: Secondary | ICD-10-CM | POA: Insufficient documentation

## 2021-12-30 MED ORDER — HYDROCHLOROTHIAZIDE 12.5 MG PO TABS: 12.5000 mg | ORAL_TABLET | Freq: Every day | ORAL | 1 refills | Status: DC

## 2021-12-31 LAB — CBC WITH DIFFERENTIAL/PLATELET
Absolute Monocytes: 728 cells/uL (ref 200–950)
Basophils Absolute: 32 cells/uL (ref 0–200)
Basophils Relative: 0.3 %
Eosinophils Absolute: 107 cells/uL (ref 15–500)
Eosinophils Relative: 1 %
HCT: 43.7 % (ref 38.5–50.0)
Hemoglobin: 14.7 g/dL (ref 13.2–17.1)
Lymphs Abs: 2097 cells/uL (ref 850–3900)
MCH: 29.5 pg (ref 27.0–33.0)
MCHC: 33.6 g/dL (ref 32.0–36.0)
MCV: 87.6 fL (ref 80.0–100.0)
MPV: 12.1 fL (ref 7.5–12.5)
Monocytes Relative: 6.8 %
Neutro Abs: 7736 cells/uL (ref 1500–7800)
Neutrophils Relative %: 72.3 %
Platelets: 196 10*3/uL (ref 140–400)
RBC: 4.99 10*6/uL (ref 4.20–5.80)
RDW: 13.3 % (ref 11.0–15.0)
Total Lymphocyte: 19.6 %
WBC: 10.7 10*3/uL (ref 3.8–10.8)

## 2021-12-31 LAB — COMPLETE METABOLIC PANEL WITH GFR
AG Ratio: 2.1 (calc) (ref 1.0–2.5)
ALT: 38 U/L (ref 9–46)
AST: 24 U/L (ref 10–40)
Albumin: 4.4 g/dL (ref 3.6–5.1)
Alkaline phosphatase (APISO): 69 U/L (ref 36–130)
BUN: 17 mg/dL (ref 7–25)
CO2: 29 mmol/L (ref 20–32)
Calcium: 9.4 mg/dL (ref 8.6–10.3)
Chloride: 106 mmol/L (ref 98–110)
Creat: 0.96 mg/dL (ref 0.60–1.24)
Globulin: 2.1 g/dL (calc) (ref 1.9–3.7)
Glucose, Bld: 94 mg/dL (ref 65–99)
Potassium: 4.4 mmol/L (ref 3.5–5.3)
Sodium: 141 mmol/L (ref 135–146)
Total Bilirubin: 0.7 mg/dL (ref 0.2–1.2)
Total Protein: 6.5 g/dL (ref 6.1–8.1)
eGFR: 113 mL/min/{1.73_m2} (ref >=60)

## 2022-01-03 DIAGNOSIS — R079 Chest pain, unspecified: Secondary | ICD-10-CM | POA: Insufficient documentation

## 2022-01-03 NOTE — Assessment & Plan Note (Signed)
Prescription for hydrochlorothiazide renewed.  Updated labs ordered. ?

## 2022-01-03 NOTE — Assessment & Plan Note (Signed)
He has had intermittent episodes of chest pain.  There is a family history of hypertrophic cardiomyopathy.  EKG completed showing normal sinus rhythm without any signs of left ventricular hypertrophy.  Echocardiogram ordered today. ?

## 2022-01-03 NOTE — Progress Notes (Signed)
?Erik White - 26 y.o. male MRN [id number]  Date of birth: Aug 21, 1996 ? ?Subjective ?Chief Complaint  ?Patient presents with  ? Chest Pain  ? ? ?HPI ?Erik White is a 26 year old male here today for follow-up visit. ?He has history of hypertension.  This has been treated with hydrochlorothiazide 12.5 mg.  He has done well with this in the past.  He is currently out of medication.  He does have complaint of recurrent episodes of chest pain.  Chest pain has been midsternal.  He does have some dyspnea with this at times.  He denies any increased dyspnea with exercise.  He has not had palpitations or dizziness.  He does have a family history of several family numbers who have hypertrophic cardiomyopathy. ? ?ROS:  A comprehensive ROS was completed and negative except as noted per HPI ? ? ? ?Past Medical History:  ?Diagnosis Date  ? GERD (gastroesophageal reflux disease)   ? Headache   ? migraines  ? Hypertension   ? Mesenteric adenitis 2005  ? 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 ? ?Past Surgical History:  ?Procedure Laterality Date  ? APPENDECTOMY    ? LAPAROSCOPIC APPENDECTOMY N/A 04/04/2020  ? Procedure: APPENDECTOMY LAPAROSCOPIC;  Surgeon: Armandina Gemma, MD;  [facility];  Service: General;  Laterality: N/A;  ? SHOULDER ARTHROSCOPY WITH LABRAL REPAIR Left 05/28/2016  ? Procedure: LEFT SHOULDER ARTHROSCOPY WITH ANTERIOR AND POSTERIOR CAPSULOLABRAL RECONSTRUCTION;  Surgeon: Justice Britain, MD;  Location: [place];  Service: Orthopedics;  Laterality: Left;  ? WISDOM TOOTH EXTRACTION    ? ? ?Social History  ? ?Socioeconomic History  ? Marital status: Single  ?  Spouse name: Not on file  ? Number of children: Not on file  ? Years of education: Not on file  ? Highest education level: Not on file  ?Occupational History  ? Not on file  ?Tobacco Use  ? Smoking status: Never  ? Smokeless tobacco: Never  ?Vaping Use  ? Vaping Use: Never used  ?Substance and Sexual Activity  ? Alcohol use: No  ? Drug use: No  ? Sexual activity: Not  on file  ?Other Topics Concern  ? Not on file  ?Social History Narrative  ? Not on file  ? ?Social Determinants of Health  ? ?Financial Resource Strain: Not on file  ?Food Insecurity: Not on file  ?Transportation Needs: Not on file  ?Physical Activity: Not on file  ?Stress: Not on file  ?Social Connections: Not on file  ? ? ?Family History  ?Problem Relation Age of Onset  ? Heart attack Father   ? Cancer Other   ?     lung  ? Cancer Other   ?     liver CA  ? Thyroid disease Mother   ? Hypertension Mother   ? ? ?Health Maintenance  ?Topic Date Due  ? COVID-19 Vaccine (1) 06/05/2022 (Originally 03/05/1998)  ? HPV VACCINES (1 - Male 2-dose series) 12/31/2022 (Originally 09/04/2008)  ? Hepatitis C Screening  12/31/2022 (Originally 09/05/2015)  ? INFLUENZA VACCINE  05/05/2022  ? TETANUS/TDAP  04/25/2028  ? HIV Screening  Completed  ? ? ? ?----------------------------------------------------------------------------------------------------------------------------------------------------------------------------------------------------------------- ?Physical Exam ?BP 126/77 (BP Location: Left Arm, Patient Position: Sitting, Cuff Size: Large)   Pulse (!) 151   Ht '6\' 1"'$  (1.854 m)   Wt 271 lb (122.9 kg)   SpO2 98%   BMI 35.75 kg/m?  ? ?Physical Exam ?Constitutional:   ?   Appearance: He is well-developed.  ?HENT:  ?  Head: Normocephalic and atraumatic.  ?Eyes:  ?   General: No scleral icterus. ?Cardiovascular:  ?   Rate and Rhythm: Normal rate and regular rhythm.  ?Pulmonary:  ?   Effort: Pulmonary effort is normal.  ?   Breath sounds: Normal breath sounds.  ?Musculoskeletal:  ?   Cervical back: Neck supple.  ?Neurological:  ?   General: No focal deficit present.  ?   Mental Status: He is alert.  ?Psychiatric:     ?   Mood and Affect: Mood normal.     ?   Behavior: Behavior normal.  ? ?EKG: Normal sinus  rhythm. ?------------------------------------------------------------------------------------------------------------------------------------------------------------------------------------------------------------------- ?Assessment and Plan ? ?Essential hypertension ?Prescription for hydrochlorothiazide renewed.  Updated labs ordered. ? ?Chest pain ?He has had intermittent episodes of chest pain.  There is a family history of hypertrophic cardiomyopathy.  EKG completed showing normal sinus rhythm without any signs of left ventricular hypertrophy.  Echocardiogram ordered today.   ? ? ?Meds ordered this encounter  ?Medications  ? hydrochlorothiazide (HYDRODIURIL) 12.5 MG tablet  ?  Sig: Take 1 tablet (12.5 mg total) by mouth daily.  ?  Dispense:  90 tablet  ?  Refill:  1  ? ? ?No follow-ups on file. ? ? ? ?This visit occurred during the SARS-CoV-2 public health emergency.  Safety protocols were in place, including screening questions prior to the visit, additional usage of staff PPE, and extensive cleaning of exam room while observing appropriate contact time as indicated for disinfecting solutions.  ? ?

## 2022-01-12 ENCOUNTER — Ambulatory Visit (INDEPENDENT_AMBULATORY_CARE_PROVIDER_SITE_OTHER): Payer: No Typology Code available for payment source

## 2022-01-12 ENCOUNTER — Ambulatory Visit (INDEPENDENT_AMBULATORY_CARE_PROVIDER_SITE_OTHER): Payer: No Typology Code available for payment source | Admitting: Sports Medicine

## 2022-01-12 DIAGNOSIS — G8929 Other chronic pain: Secondary | ICD-10-CM

## 2022-01-12 DIAGNOSIS — M25511 Pain in right shoulder: Secondary | ICD-10-CM

## 2022-01-12 MED ORDER — MELOXICAM 15 MG PO TABS: ORAL_TABLET | ORAL | 3 refills | Status: DC

## 2022-01-12 NOTE — Assessment & Plan Note (Signed)
Pleasant 26 year old male, chronic right shoulder pain, history of left shoulder labral tear post repair with Dr. Onnie Graham at [place]. ?He is does still have some instability and discomfort in the left shoulder. ?In the right shoulder he has pain over the deltoid, posteriorly, worse with overhead activities and abduction. ?On exam he has positive Neer's, Hawkins, empty can signs, he really does not have any labral signs today. ?We discussed the anatomy, force coupling function of the rotator cuff, adding x-rays, meloxicam, physical therapy in [place], he will self restrict himself at work. ?Return in 6 weeks, MR arthrography if not better. ?We will likely use Dr. Onnie Graham again if operative intervention is needed. ?

## 2022-01-12 NOTE — Progress Notes (Signed)
? ? ?  Procedures performed today:   ? ?None. ? ?Independent interpretation of notes and tests performed by another provider:  ? ?None. ? ?Brief History, Exam, Impression, and Recommendations:   ? ?Chronic right shoulder pain ?Pleasant 26 year old male, chronic right shoulder pain, history of left shoulder labral tear post repair with Dr. Onnie Graham at [place]. ?He is does still have some instability and discomfort in the left shoulder. ?In the right shoulder he has pain over the deltoid, posteriorly, worse with overhead activities and abduction. ?On exam he has positive Neer's, Hawkins, empty can signs, he really does not have any labral signs today. ?We discussed the anatomy, force coupling function of the rotator cuff, adding x-rays, meloxicam, physical therapy in [place], he will self restrict himself at work. ?Return in 6 weeks, MR arthrography if not better. ?We will likely use Dr. Onnie Graham again if operative intervention is needed. ? ? ? ?___________________________________________ ?Gwen Her. Dianah Field, M.D., ABFM., CAQSM. ?Primary Care and Sports Medicine ?[place] ? ?Adjunct Instructor of Family Medicine  ?[employer] ?

## 2022-01-13 ENCOUNTER — Telehealth: Payer: Self-pay

## 2022-01-13 DIAGNOSIS — G8929 Other chronic pain: Secondary | ICD-10-CM

## 2022-01-13 NOTE — Telephone Encounter (Signed)
Spoke with Maggie at [facility] to let her know the order had been changed.  ?

## 2022-01-13 NOTE — Telephone Encounter (Signed)
Per Crystal at [facility], they do all their shoulders as OT. She would like th order changed accordingly.  ?

## 2022-01-13 NOTE — Telephone Encounter (Signed)
Referral switched to Occupational Therapy. ?

## 2022-01-20 ENCOUNTER — Ambulatory Visit (HOSPITAL_COMMUNITY): Payer: No Typology Code available for payment source | Attending: Cardiology

## 2022-01-20 DIAGNOSIS — I421 Obstructive hypertrophic cardiomyopathy: Secondary | ICD-10-CM

## 2022-01-20 DIAGNOSIS — Z8249 Family history of ischemic heart disease and other diseases of the circulatory system: Secondary | ICD-10-CM | POA: Diagnosis present

## 2022-01-20 DIAGNOSIS — R079 Chest pain, unspecified: Secondary | ICD-10-CM | POA: Insufficient documentation

## 2022-01-20 DIAGNOSIS — I10 Essential (primary) hypertension: Secondary | ICD-10-CM | POA: Diagnosis present

## 2022-01-20 LAB — ECHOCARDIOGRAM COMPLETE
Area-P 1/2: 2.99 cm2
S' Lateral: 3.2 cm

## 2022-01-20 MED ORDER — PERFLUTREN LIPID MICROSPHERE
1.0000 mL | INTRAVENOUS | Status: AC | PRN
  Administered 2022-01-20: 1 mL via INTRAVENOUS

## 2022-01-27 ENCOUNTER — Encounter (HOSPITAL_COMMUNITY): Payer: Self-pay | Admitting: Physical Therapy

## 2022-01-27 ENCOUNTER — Ambulatory Visit (HOSPITAL_COMMUNITY): Payer: No Typology Code available for payment source | Attending: Sports Medicine | Admitting: Physical Therapy

## 2022-01-27 DIAGNOSIS — M25511 Pain in right shoulder: Secondary | ICD-10-CM | POA: Diagnosis present

## 2022-01-27 DIAGNOSIS — R29898 Other symptoms and signs involving the musculoskeletal system: Secondary | ICD-10-CM | POA: Insufficient documentation

## 2022-01-27 DIAGNOSIS — M6281 Muscle weakness (generalized): Secondary | ICD-10-CM | POA: Diagnosis present

## 2022-01-27 DIAGNOSIS — G8929 Other chronic pain: Secondary | ICD-10-CM | POA: Insufficient documentation

## 2022-01-27 NOTE — Therapy (Signed)
?OUTPATIENT PHYSICAL THERAPY SHOULDER EVALUATION ? ? ?Patient Name: Erik White ?MRN: [id number] ?DOB:1996/04/13, 26 y.o., male ?Today's Date: 01/27/2022 ? ? PT End of Session - 01/27/22 1100   ? ? Visit Number 1   ? Number of Visits 8   ? Date for PT Re-Evaluation 03/24/22   ? Authorization Type [place] [facility] Josem Kaufmann after 12th visit no VL)   ? PT Start Time 1100   arrives late/deleyed check in  ? PT Stop Time 1136   ? PT Time Calculation (min) 36 min   ? Activity Tolerance Patient tolerated treatment well   ? Behavior During Therapy [facility] for tasks assessed/performed   ? ?  ?  ? ?  ? ? ?Past Medical History:  ?Diagnosis Date  ? GERD (gastroesophageal reflux disease)   ? Headache   ? migraines  ? Hypertension   ? Mesenteric adenitis 2005  ? 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 ?Past Surgical History:  ?Procedure Laterality Date  ? APPENDECTOMY    ? LAPAROSCOPIC APPENDECTOMY N/A 04/04/2020  ? Procedure: APPENDECTOMY LAPAROSCOPIC;  Surgeon: Armandina Gemma, MD;  [facility];  Service: General;  Laterality: N/A;  ? SHOULDER ARTHROSCOPY WITH LABRAL REPAIR Left 05/28/2016  ? Procedure: LEFT SHOULDER ARTHROSCOPY WITH ANTERIOR AND POSTERIOR CAPSULOLABRAL RECONSTRUCTION;  Surgeon: Justice Britain, MD;  Location: [place];  Service: Orthopedics;  Laterality: Left;  ? WISDOM TOOTH EXTRACTION    ? ?Patient Active Problem List  ? Diagnosis Date Noted  ? Chronic right shoulder pain 01/12/2022  ? Chest pain 01/03/2022  ? Eczema 12/30/2021  ? Ear bleeding, left 04/25/2020  ? Right lower quadrant abdominal pain 04/03/2020  ? Acute appendicitis with localized peritonitis 04/03/2020  ? Obesity (BMI 30-39.9) 04/03/2020  ? Essential hypertension 05/25/2019  ? 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 SLAP lesion of shoulder 05/01/2016  ? Breast mass in male 04/20/2016  ? 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 Patellar tendonitis of left knee 04/20/2016  ? ? ?PCP: Luetta Nutting, DO ? ?REFERRING PROVIDER: Silverio Decamp, MD  ? ?REFERRING  DIAG: M25.511,G89.29 (ICD-10-CM) - Chronic right shoulder pain  ? ?THERAPY DIAG:  ?Right shoulder pain, unspecified chronicity ? ?Muscle weakness (generalized) ? ?Other symptoms and signs involving the musculoskeletal system ? ? ?ONSET DATE: October 2022 ? ?SUBJECTIVE:                                                                                                                                                                                     ? ?SUBJECTIVE STATEMENT: ?Patient states pain with overhead motion. Lifting, throwing, shooting basketball. Popping with slight discomfort with throwing with progressive worsening. Symptoms with gradual  worsening since insidious onset.  ? ?PERTINENT HISTORY: ?2017 - history of left shoulder labral tear post repair with Dr. Onnie Graham at [place].  ? ?PAIN:  ?Are you having pain? Yes: NPRS scale: 3/10 ?Pain location: R shoulder posterior and anterior ?Pain description: dull ?Aggravating factors: throwing, lifting ?Relieving factors: none, advil ? ?PRECAUTIONS: None ? ?WEIGHT BEARING RESTRICTIONS No ? ?FALLS:  ?Has patient fallen in last 6 months? No ? ?LIVING ENVIRONMENT: ?Lives with: lives with their spouse ?Lives in: House/apartment ?Stairs: Yes: Internal: flight steps; can reach both and External: 4 steps; can reach both ?Has following equipment at home: None ? ?OCCUPATION: ?[employer] ? ?PLOF: Independent ? ?PATIENT GOALS decrease shoulder pain ? ?OBJECTIVE:  ? ?DIAGNOSTIC FINDINGS:  ?4/10 IMPRESSION: ?No radiographic abnormality is seen in the right shoulder. ? ?PATIENT SURVEYS:  ?FOTO Complete next session - time constraints  ? ?COGNITION: ? Overall cognitive status: Within functional limits for tasks assessed ?    ?SENSATION: ?WFL ? ?POSTURE: ?Rounded shoulders ? ?UPPER EXTREMITY ROM:  ? ?Active ROM Right ?01/27/2022 Left ?01/27/2022  ?Shoulder flexion 158 159  ?Shoulder extension    ?Shoulder abduction 148 147  ?Shoulder adduction    ?Shoulder internal  rotation T7 pain anterior shoulder T6  ?Shoulder external rotation T5 pain posterior shoulder T5  ?Elbow flexion    ?Elbow extension    ?Wrist flexion    ?Wrist extension    ?Wrist ulnar deviation    ?Wrist radial deviation    ?Wrist pronation    ?Wrist supination    ?(Blank rows = not tested) ? ?UPPER EXTREMITY MMT: ? ?MMT Right ?01/27/2022 Left ?01/27/2022  ?Shoulder flexion 5/5 5/5  ?Shoulder extension    ?Shoulder abduction 5/5 *pain 5/5  ?Shoulder adduction    ?Shoulder internal rotation 5/5 5/5  ?Shoulder external rotation 4+/5 * 5/5  ?Middle trapezius    ?Lower trapezius    ?Elbow flexion 5/5 5/5  ?Elbow extension 5/5 5/5  ?Wrist flexion    ?Wrist extension    ?Wrist ulnar deviation    ?Wrist radial deviation    ?Wrist pronation    ?Wrist supination    ?Grip strength (lbs)    ?(Blank rows = not tested) ? ? ?JOINT MOBILITY TESTING:  ?WFL ? ?PALPATION:  ?TTP R infraspinatus/teres minor insertion, posterior delt, coracoid process ?  ?TODAY'S TREATMENT:  ?01/27/22 ?Scap retraction with [place] [facility] 3x 10 GTB ?Horizonal abduction 3x 30 GTB ? ? ? ?PATIENT EDUCATION:  ?Education details: Patient educated on exam findings, POC, scope of PT, HEP, and RC strengthening for stability. ?Person educated: Patient ?Education method: Explanation, Demonstration, and Handouts ?Education comprehension: verbalized understanding, returned demonstration, verbal cues required, and tactile cues required ? ? ? ?HOME EXERCISE PROGRAM: ?Access Code: [email or web address] ?- Shoulder External Rotation and Scapular Retraction with Resistance  - 1 x daily - 7 x weekly - 3 sets - 10 reps ?- Standing Shoulder Horizontal Abduction with Resistance  - 1 x daily - 7 x weekly - 3 sets - 10 reps ? ?ASSESSMENT: ? ?CLINICAL IMPRESSION: ?Patient a 26 y.o. y.o. male who was seen today for physical therapy evaluation and treatment for R shoulder pain.  Patient will continue to benefit from physical therapy in order to improve function and reduce impairment. ? ? ? ?OBJECTIVE  IMPAIRMENTS decreased activity tolerance, decreased endurance, decreased strength, increased muscle spasms, impaired UE functional use, improper body mechanics, and pain.  ? ?ACTIVITY LIMITATIONS cleaning, community activity, meal prep, occupation, yard work, shopping, yard work, and sports .  ? ?  PERSONAL FACTORS Profession and Time since onset of injury/illness/exacerbation are also affecting patient's functional outcome.  ? ? ?REHAB POTENTIAL: Good ? ?CLINICAL DECISION MAKING: Stable/uncomplicated ? ?EVALUATION COMPLEXITY: Low ? ? ?GOALS: ?Goals reviewed with patient? No ? ?SHORT TERM GOALS: Target date: 02/17/2022 ? ?Patient will be independent with HEP in order to improve functional outcomes. ?Baseline:  ?Goal status: INITIAL ? ?2.  Patient will report at least 25% improvement in symptoms for improved quality of life. ?Baseline:  ?Goal status: INITIAL ? ? ?LONG TERM GOALS: Target date: 03/10/2022 ? ?Patient will report at least 75% improvement in symptoms for improved quality of life. ?Baseline:  ?Goal status: INITIAL ? ?2.  Patient will improve FOTO score by at least 10 points in order to indicate improved tolerance to activity. ?Baseline:  ?Goal status: INITIAL ? ?3.  Patient will demonstrate 5/5 shoulder strength for improved stability with overhead lifting. ?Baseline:  ?Goal status: INITIAL ? ?4.  Patient will be able to return to all activities unrestricted for improved ability to perform work functions and play sports with friends. ?Baseline:  ?Goal status: INITIAL ? ?5.  Patient will be able to report no shoulder symptoms for at least 1 week with sports/lifting.  ?Baseline:  ?Goal status: INITIAL ? ? ? ? ?PLAN: ?PT FREQUENCY: 1x/week ? ?PT DURATION: 8 weeks ? ?PLANNED INTERVENTIONS: Therapeutic exercises, Therapeutic activity, Neuromuscular re-education, Balance training, Gait training, Patient/Family education, Joint manipulation, Joint mobilization, Stair training, Orthotic/Fit training, DME instructions,  Aquatic Therapy, Dry Needling, Electrical stimulation, Spinal manipulation, Spinal mobilization, Cryotherapy, Moist heat, Compression bandaging, scar mobilization, Splintting, Taping, Traction, Ultrasound,

## 2022-02-17 ENCOUNTER — Ambulatory Visit (HOSPITAL_COMMUNITY): Payer: No Typology Code available for payment source | Attending: Sports Medicine | Admitting: Physical Therapy

## 2022-02-17 ENCOUNTER — Telehealth (HOSPITAL_COMMUNITY): Payer: Self-pay | Admitting: Physical Therapy

## 2022-02-17 NOTE — Telephone Encounter (Signed)
Patient no show, left message for patient making him aware of missed appointment and reminded him of next. ? ?8:16 AM, 02/17/22 ?Mearl Latin PT, DPT ?Physical Therapist at [facility] ?[facility] ? ?

## 2022-02-24 ENCOUNTER — Telehealth (HOSPITAL_COMMUNITY): Payer: Self-pay | Admitting: Physical Therapy

## 2022-02-24 ENCOUNTER — Ambulatory Visit (HOSPITAL_COMMUNITY): Payer: No Typology Code available for payment source | Admitting: Physical Therapy

## 2022-02-24 NOTE — Telephone Encounter (Signed)
Called patient about missed appointment and he stated he called and left message to cancel yesterday as he is out of town. Patient reminded of next appointment.   11:25 AM, 02/24/22 Erik White PT, DPT Physical Therapist at [facility]

## 2022-03-03 ENCOUNTER — Encounter (HOSPITAL_COMMUNITY): Payer: No Typology Code available for payment source | Admitting: Physical Therapy

## 2022-03-03 ENCOUNTER — Ambulatory Visit: Payer: No Typology Code available for payment source | Admitting: Sports Medicine

## 2022-03-10 ENCOUNTER — Encounter (HOSPITAL_COMMUNITY): Payer: No Typology Code available for payment source

## 2022-03-17 ENCOUNTER — Encounter (HOSPITAL_COMMUNITY): Payer: No Typology Code available for payment source | Admitting: Physical Therapy

## 2022-03-24 ENCOUNTER — Encounter (HOSPITAL_COMMUNITY): Payer: No Typology Code available for payment source | Admitting: Physical Therapy

## 2022-04-02 ENCOUNTER — Ambulatory Visit
Admission: RE | Admit: 2022-04-02 | Discharge: 2022-04-02 | Disposition: A | Discharge status: Home / Self Care | Payer: No Typology Code available for payment source | Source: Ambulatory Visit | Attending: Nurse Practitioner | Admitting: Nurse Practitioner

## 2022-04-02 ENCOUNTER — Ambulatory Visit: Payer: No Typology Code available for payment source

## 2022-04-02 VITALS — BP 135/87 | HR 90 | Temp 99.2°F | Resp 18

## 2022-04-02 DIAGNOSIS — J349 Unspecified disorder of nose and nasal sinuses: Secondary | ICD-10-CM | POA: Diagnosis not present

## 2022-04-02 DIAGNOSIS — R0989 Other specified symptoms and signs involving the circulatory and respiratory systems: Secondary | ICD-10-CM | POA: Insufficient documentation

## 2022-04-02 DIAGNOSIS — J029 Acute pharyngitis, unspecified: Secondary | ICD-10-CM | POA: Diagnosis not present

## 2022-04-02 LAB — POCT RAPID STREP A (OFFICE): Rapid Strep A Screen: NEGATIVE

## 2022-04-02 MED ORDER — FLUTICASONE PROPIONATE 50 MCG/ACT NA SUSP: 2.0000 | Freq: Every day | NASAL | 0 refills | Status: DC

## 2022-04-02 MED ORDER — PSEUDOEPH-BROMPHEN-DM 30-2-10 MG/5ML PO SYRP: 5.0000 mL | ORAL_SOLUTION | Freq: Four times a day (QID) | ORAL | 0 refills | Status: DC | PRN

## 2022-04-02 MED ORDER — CETIRIZINE-PSEUDOEPHEDRINE ER 5-120 MG PO TB12: 1.0000 | ORAL_TABLET | Freq: Every day | ORAL | 0 refills | Status: DC

## 2022-04-02 NOTE — ED Provider Notes (Signed)
[facility]    CSN: [id number] Arrival date & time: 04/02/22  1056      History   Chief Complaint Chief Complaint  Patient presents with   Nasal Congestion    Entered by patient   Appointment    1100   Sore Throat         HPI GROVER ROBINSON is a 26 y.o. male.   The history is provided by the patient.   She presents for upper respiratory symptoms that been present for the past 2 days.  Patient complains of fatigue, sore throat, headache, cough, and nasal congestion.  Patient also reports that he has no sense of taste or smell.  Reports that he did have a sore throat, but that has since improved.  Patient states he slept for 16 hours yesterday and continues to experience fatigue.  Patient denies fever, chills, ear pain, wheezing, shortness of breath, or GI symptoms.  Patient works at [employer] and has exposure to [employer].  He states he has received 2 COVID vaccines.  He has been using Afrin nasal spray and Claritin for his symptoms.  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   SLAP lesion of shoulder 05/01/2016   Breast mass in male 04/20/2016   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   Patellar tendonitis of left knee 04/20/2016    Past Surgical History:  Procedure Laterality Date   APPENDECTOMY     LAPAROSCOPIC APPENDECTOMY N/A 04/04/2020   Procedure: APPENDECTOMY LAPAROSCOPIC;  Surgeon: Armandina Gemma, MD;  Location: [facility];  Service:  General;  Laterality: N/A;   SHOULDER ARTHROSCOPY WITH LABRAL REPAIR Left 05/28/2016   Procedure: LEFT SHOULDER ARTHROSCOPY WITH ANTERIOR AND POSTERIOR CAPSULOLABRAL RECONSTRUCTION;  Surgeon: Justice Britain, MD;  Location: [place];  Service: Orthopedics;  Laterality: Left;   WISDOM TOOTH EXTRACTION         Home Medications    Prior to Admission medications   Medication Sig Start Date End Date Taking? Authorizing Provider  brompheniramine-pseudoephedrine-DM 30-2-10 MG/5ML syrup Take 5 mLs by mouth 4 (four) times daily as needed. 04/02/22  Yes Enoc Getter-Warren, Alda Lea, NP  cetirizine-pseudoephedrine (ZYRTEC-D) 5-120 MG tablet Take 1 tablet by mouth daily. 04/02/22  Yes Javonne Dorko-Warren, Alda Lea, NP  fluticasone (FLONASE) 50 MCG/ACT nasal spray Place 2 sprays into both nostrils daily. 04/02/22  Yes Starkeisha Vanwinkle-Warren, Alda Lea, NP  hydrochlorothiazide (HYDRODIURIL) 12.5 MG tablet Take 1 tablet (12.5 mg total) by mouth daily. 12/30/21   Luetta Nutting, DO  meloxicam (MOBIC) 15 MG tablet One tab PO every 24 hours with a meal for 2 weeks, then once every 24 hours prn pain. 01/12/22   Silverio Decamp, MD  albuterol (VENTOLIN HFA) 108 (90 Base) MCG/ACT inhaler Inhale 2 puffs into the lungs every 6 (six) hours as needed for shortness of breath. 10/28/20 02/01/21  Donella Stade, PA-C    Family History Family History  Problem Relation Age of Onset   Heart  attack Father    Cancer Other        lung   Cancer Other        liver CA   Thyroid disease Mother    Hypertension Mother     Social History Social History   Tobacco Use   Smoking status: Never   Smokeless tobacco: Never  Vaping Use   Vaping Use: Never used  Substance Use Topics   Alcohol use: No   Drug use: No     Allergies   Other   Review of Systems Review of Systems Per HPI  Physical Exam Triage Vital Signs ED Triage Vitals [04/02/22 1110]  Enc Vitals Group     BP 135/87     Pulse Rate 90     Resp 18     Temp 99.2 F  (37.3 C)     Temp Source Oral     SpO2 97 %     Weight      Height      Head Circumference      Peak Flow      Pain Score      Pain Loc      Pain Edu?      Excl. in [place]?    No data found.  Updated Vital Signs BP 135/87 (BP Location: Right Arm)   Pulse 90   Temp 99.2 F (37.3 C) (Oral)   Resp 18   SpO2 97%   Visual Acuity Right Eye Distance:   Left Eye Distance:   Bilateral Distance:    Right Eye Near:   Left Eye Near:    Bilateral Near:     Physical Exam Vitals and nursing note reviewed.  Constitutional:      General: He is not in acute distress.    Appearance: He is well-developed.  HENT:     Head: Normocephalic.     Right Ear: Tympanic membrane and ear canal normal.     Left Ear: Tympanic membrane and ear canal normal.     Nose: Congestion and rhinorrhea present. Rhinorrhea is clear.     Right Turbinates: Enlarged and swollen.     Left Turbinates: Enlarged and swollen.     Right Sinus: No maxillary sinus tenderness or frontal sinus tenderness.     Left Sinus: No maxillary sinus tenderness or frontal sinus tenderness.     Mouth/Throat:     Pharynx: Uvula midline. Posterior oropharyngeal erythema present. No pharyngeal swelling.     Tonsils: No tonsillar exudate. 0 on the right. 0 on the left.  Eyes:     Conjunctiva/sclera: Conjunctivae normal.     Pupils: Pupils are equal, round, and reactive to light.  Cardiovascular:     Rate and Rhythm: Normal rate and regular rhythm.     Heart sounds: Normal heart sounds.  Pulmonary:     Effort: Pulmonary effort is normal.     Breath sounds: Normal breath sounds.  Abdominal:     General: Bowel sounds are normal.     Palpations: Abdomen is soft.     Tenderness: There is no abdominal tenderness.  Musculoskeletal:     Cervical back: Normal range of motion and neck supple.  Lymphadenopathy:     Cervical: No cervical adenopathy.  Skin:    General: Skin is warm and dry.  Neurological:     General: No focal deficit  present.     Mental Status: He is alert and oriented to person, place, and time.  Psychiatric:  Mood and Affect: Mood normal.        Behavior: Behavior normal.      UC Treatments / Results  Labs (all labs ordered are listed, but only abnormal results are displayed) Labs Reviewed  COVID-19, FLU A+B NAA  CULTURE, GROUP A STREP [facility])  POCT RAPID STREP A (OFFICE)    EKG   Radiology No results found.  Procedures Procedures (including critical care time)  Medications Ordered in UC Medications - No data to display  Initial Impression / Assessment and Plan / UC Course  I have reviewed the triage vital signs and the nursing notes.  Pertinent labs & imaging results that were available during my care of the patient were reviewed by me and considered in my medical decision making (see chart for details).  Patient presents with upper respiratory symptoms that been present for the past 2 to 3 days.  Patient reports loss of taste and smell.  There is concern for COVID at this time.  COVID/flu test is pending.  Rapid strep test is negative.  Throat culture is also pending.  We will treat patient symptomatically for upper respiratory infection at this time.  Discussed with patient the options of using an antiviral without a positive COVID test.  Patient was in agreement with waiting until COVID results are received.  Symptomatic treatment was provided to the patient to include Bromfed, cetirizine, and fluticasone.  Supportive care recommendations were provided to the patient.  Patient advised to follow-up for any worsening of symptoms or other concerns. Final Clinical Impressions(s) / UC Diagnoses   Final diagnoses:  Upper respiratory symptom     Discharge Instructions      The rapid strep test is negative.  A throat culture is pending.  A COVID/flu test is also pending.  You will be contacted if the results are positive. Take medication as prescribed. Increase fluids and allow  for plenty of rest. Recommend Tylenol or ibuprofen as needed for pain, fever, or general discomfort. Use normal saline nasal spray to help with nasal congestion throughout the day. Warm salt water gargles 3-4 times daily to help with throat pain or discomfort. Recommend using a humidifier at bedtime during sleep to help with cough and nasal congestion. Sleep elevated on 2 pillows. Follow-up if your symptoms do not improve.      ED Prescriptions     Medication Sig Dispense Auth. Provider   brompheniramine-pseudoephedrine-DM 30-2-10 MG/5ML syrup Take 5 mLs by mouth 4 (four) times daily as needed. 140 mL Rivkah Wolz-Warren, Alda Lea, NP   fluticasone (FLONASE) 50 MCG/ACT nasal spray Place 2 sprays into both nostrils daily. 16 g Joey Hudock-Warren, Alda Lea, NP   cetirizine-pseudoephedrine (ZYRTEC-D) 5-120 MG tablet Take 1 tablet by mouth daily. 30 tablet Shanna Un-Warren, Alda Lea, NP      PDMP not reviewed this encounter.   Tish Men, NP 04/02/22 1322

## 2022-04-02 NOTE — ED Triage Notes (Signed)
Pt reports sore throat,headache, cough and nasal congestion x 2 days; fatigue x 1 day. States he slept 16 hrs yesterday, can not taste the food since yesterday. Afrin gives no relief.

## 2022-04-02 NOTE — Discharge Instructions (Addendum)
The rapid strep test is negative.  A throat culture is pending.  A COVID/flu test is also pending.  You will be contacted if the results are positive. Take medication as prescribed. Increase fluids and allow for plenty of rest. Recommend Tylenol or ibuprofen as needed for pain, fever, or general discomfort. Use normal saline nasal spray to help with nasal congestion throughout the day. Warm salt water gargles 3-4 times daily to help with throat pain or discomfort. Recommend using a humidifier at bedtime during sleep to help with cough and nasal congestion. Sleep elevated on 2 pillows. Follow-up if your symptoms do not improve.

## 2022-04-03 LAB — COVID-19, FLU A+B NAA
Influenza A, NAA: NOT DETECTED
Influenza B, NAA: NOT DETECTED
SARS-CoV-2, NAA: NOT DETECTED

## 2022-04-05 LAB — CULTURE, GROUP A STREP (THRC)

## 2022-07-07 ENCOUNTER — Ambulatory Visit: Payer: No Typology Code available for payment source | Admitting: Family Medicine

## 2023-01-22 ENCOUNTER — Ambulatory Visit
Admission: RE | Admit: 2023-01-22 | Discharge: 2023-01-22 | Disposition: A | Discharge status: Home / Self Care | Payer: 59 | Source: Ambulatory Visit | Attending: Physician Assistant | Admitting: Physician Assistant

## 2023-01-22 VITALS — BP 130/79 | HR 109 | Temp 98.4°F | Resp 20

## 2023-01-22 DIAGNOSIS — J069 Acute upper respiratory infection, unspecified: Secondary | ICD-10-CM | POA: Diagnosis not present

## 2023-01-22 DIAGNOSIS — Z1152 Encounter for screening for COVID-19: Secondary | ICD-10-CM | POA: Insufficient documentation

## 2023-01-22 DIAGNOSIS — R051 Acute cough: Secondary | ICD-10-CM | POA: Diagnosis not present

## 2023-01-22 DIAGNOSIS — Z8616 Personal history of COVID-19: Secondary | ICD-10-CM | POA: Diagnosis not present

## 2023-01-22 DIAGNOSIS — R0981 Nasal congestion: Secondary | ICD-10-CM | POA: Diagnosis not present

## 2023-01-22 LAB — POCT INFLUENZA A/B
Influenza A, POC: NEGATIVE
Influenza B, POC: NEGATIVE

## 2023-01-22 MED ORDER — FLUTICASONE PROPIONATE 50 MCG/ACT NA SUSP: 2.0000 | Freq: Every day | NASAL | 0 refills | Status: DC

## 2023-01-22 MED ORDER — PROMETHAZINE-DM 6.25-15 MG/5ML PO SYRP: 5.0000 mL | ORAL_SOLUTION | Freq: Two times a day (BID) | ORAL | 0 refills | Status: DC | PRN

## 2023-01-22 NOTE — ED Triage Notes (Signed)
Pt reports he has a cough, nasal congestion, nauseas, fatigue sweating, and a sore x 2 days. Took ibuprofen, mucinex, and afrin but no relief.

## 2023-01-22 NOTE — Discharge Instructions (Signed)
Your flu test was negative.  We will contact you if your COVID test is positive.  Please monitor your MyChart for these results.  I believe that you have a virus.  Please continue over-the-counter medication including Mucinex and allergy medicine.  I have called in Promethazine DM for cough.  This will make you sleepy so do not drive or drink alcohol with taking it.  Use fluticasone to help with your congestion symptoms.  Limit use of Afrin to no more than 3 days as it can cause rebound congestion.  Make sure you rest and drink plenty of fluids.  If your symptoms or not improving within a week please return for reevaluation.  If anything worsens and you have a fever, worsening cough, shortness of breath, nausea, vomiting you need to be seen immediately.

## 2023-01-22 NOTE — ED Provider Notes (Signed)
[facility]    CSN: [id number] Arrival date & time: 01/22/23  1353      History   Chief Complaint Chief Complaint  Patient presents with   Nasal Congestion    Entered by patient    HPI Erik White is a 27 y.o. male.   Patient presents today with a 2-day history of URI symptoms.  Reports nasal congestion, cough, scratchy throat, body aches, nausea without vomiting.  Denies any fever, chest pain, shortness of breath, vomiting, diarrhea.  Denies any known sick contacts.  He has had COVID several years ago.  He has had COVID-19 vaccines but has not had most recent booster or recent influenza vaccine.  Denies any history of allergies, asthma, COPD, smoking.  Denies any recent antibiotics or steroids.  He has tried over-the-counter medication including Afrin, Mucinex without improvement of symptoms.  He is having difficulty with heel activities as result of symptoms.    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   SLAP lesion of shoulder 05/01/2016   Breast mass in male 04/20/2016   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   Patellar tendonitis of left knee 04/20/2016    Past Surgical History:  Procedure Laterality Date   APPENDECTOMY     LAPAROSCOPIC APPENDECTOMY N/A 04/04/2020   Procedure: APPENDECTOMY LAPAROSCOPIC;  Surgeon: Darnell Level, MD;  Location: [facility];  Service: General;  Laterality: N/A;   SHOULDER ARTHROSCOPY WITH LABRAL REPAIR Left  05/28/2016   Procedure: LEFT SHOULDER ARTHROSCOPY WITH ANTERIOR AND POSTERIOR CAPSULOLABRAL RECONSTRUCTION;  Surgeon: Francena Hanly, MD;  Location[facility];  Service: Orthopedics;  Laterality: Left;   WISDOM TOOTH EXTRACTION         Home Medications    Prior to Admission medications   Medication Sig Start Date End Date Taking? Authorizing Provider  promethazine-dextromethorphan (PROMETHAZINE-DM) 6.25-15 MG/5ML syrup Take 5 mLs by mouth 2 (two) times daily as needed for cough. 01/22/23  Yes Elick Aguilera K, PA-C  fluticasone (FLONASE) 50 MCG/ACT nasal spray Place 2 sprays into both nostrils daily. 01/22/23   Jasmeen Fritsch, Noberto Retort, PA-C  hydrochlorothiazide (HYDRODIURIL) 12.5 MG tablet Take 1 tablet (12.5 mg total) by mouth daily. 12/30/21   Everrett Coombe, DO  meloxicam (MOBIC) 15 MG tablet One tab PO every 24 hours with a meal for 2 weeks, then once every 24 hours prn pain. 01/12/22   Monica Becton, MD  albuterol (VENTOLIN HFA) 108 (90 Base) MCG/ACT inhaler Inhale 2 puffs into the lungs every 6 (six) hours as needed for shortness of breath. 10/28/20 02/01/21  Jomarie Longs, PA-C    Family History Family History  Problem Relation Age of Onset   Heart attack Father    Cancer Other        lung   Cancer Other        liver CA   Thyroid disease Mother    Hypertension Mother     Social History Social History  Tobacco Use   Smoking status: Never   Smokeless tobacco: Never  Vaping Use   Vaping Use: Never used  Substance Use Topics   Alcohol use: No   Drug use: No     Allergies   Other   Review of Systems Review of Systems  Constitutional:  Positive for activity change. Negative for appetite change, fatigue and fever.  HENT:  Positive for congestion and sore throat. Negative for sinus pressure and sneezing.   Respiratory:  Positive for cough. Negative for shortness of breath.   Cardiovascular:  Negative for chest pain.  Gastrointestinal:  Positive for nausea. Negative for  abdominal pain, diarrhea and vomiting.  Musculoskeletal:  Positive for arthralgias and myalgias.  Neurological:  Negative for dizziness, light-headedness and headaches.     Physical Exam Triage Vital Signs ED Triage Vitals  Enc Vitals Group     BP 01/22/23 1423 130/79     Pulse Rate 01/22/23 1423 (!) 109     Resp 01/22/23 1423 20     Temp 01/22/23 1423 98.4 F (36.9 C)     Temp Source 01/22/23 1423 Oral     SpO2 01/22/23 1423 98 %     Weight --      Height --      Head Circumference --      Peak Flow --      Pain Score 01/22/23 1425 0     Pain Loc --      Pain Edu? --      Excl. in [place]? --    No data found.  Updated Vital Signs BP 130/79 (BP Location: Right Arm)   Pulse (!) 109   Temp 98.4 F (36.9 C) (Oral)   Resp 20   SpO2 98%   Visual Acuity Right Eye Distance:   Left Eye Distance:   Bilateral Distance:    Right Eye Near:   Left Eye Near:    Bilateral Near:     Physical Exam Vitals reviewed.  Constitutional:      General: He is awake.     Appearance: Normal appearance. He is well-developed. He is not ill-appearing.     Comments: Very pleasant male appears stated age in no acute distress sitting comfortably in exam room  HENT:     Head: Normocephalic and atraumatic.     Right Ear: Ear canal and external ear normal. A middle ear effusion is present. Tympanic membrane is not erythematous or bulging.     Left Ear: Ear canal and external ear normal. A middle ear effusion is present. Tympanic membrane is not erythematous or bulging.     Nose: Congestion present.     Mouth/Throat:     Pharynx: Uvula midline. Posterior oropharyngeal erythema present. No oropharyngeal exudate.  Cardiovascular:     Rate and Rhythm: Regular rhythm. Tachycardia present.     Heart sounds: Normal heart sounds, S1 normal and S2 normal. No murmur heard. Pulmonary:     Effort: Pulmonary effort is normal. No accessory muscle usage or respiratory distress.     Breath sounds: Normal  breath sounds. No stridor. No wheezing, rhonchi or rales.     Comments: Clear to auscultation bilaterally Abdominal:     General: Bowel sounds are normal.     Palpations: Abdomen is soft.     Tenderness: There is no abdominal tenderness.  Neurological:     Mental Status: He is alert.  Psychiatric:        Behavior: Behavior is cooperative.  UC Treatments / Results  Labs (all labs ordered are listed, but only abnormal results are displayed) Labs Reviewed  SARS CORONAVIRUS 2 (TAT 6-24 HRS)  POCT INFLUENZA A/B    EKG   Radiology No results found.  Procedures Procedures (including critical care time)  Medications Ordered in UC Medications - No data to display  Initial Impression / Assessment and Plan / UC Course  I have reviewed the triage vital signs and the nursing notes.  Pertinent labs & imaging results that were available during my care of the patient were reviewed by me and considered in my medical decision making (see chart for details).     Patient is mildly tachycardic but otherwise well-appearing, afebrile, nontoxic.  No evidence of acute infection on physical exam that warrant initiation of antibiotics.  Influenza testing was obtained and was negative.  COVID test is pending.  Patient is young and otherwise healthy so not a candidate for antiviral therapy.  Recommended conservative treatment measures including over-the-counter antihistamines and Mucinex.  He was started on Flonase as well as Promethazine DM to manage his congestion symptoms.  Discussed that Promethazine DM is sedating and he should not drive or drink alcohol with taking it.  He is to rest and drink plenty of fluids.  Discussed that if symptoms or not improving within a week he should return for reevaluation.  If anything worsens and he has high fever, worsening cough, shortness of breath, chest pain, nausea, vomiting he needs to be seen immediately.  Strict return precautions given.  Work excuse  note provided.  Final Clinical Impressions(s) / UC Diagnoses   Final diagnoses:  Upper respiratory tract infection, unspecified type  Acute cough  Nasal congestion     Discharge Instructions      Your flu test was negative.  We will contact you if your COVID test is positive.  Please monitor your MyChart for these results.  I believe that you have a virus.  Please continue over-the-counter medication including Mucinex and allergy medicine.  I have called in Promethazine DM for cough.  This will make you sleepy so do not drive or drink alcohol with taking it.  Use fluticasone to help with your congestion symptoms.  Limit use of Afrin to no more than 3 days as it can cause rebound congestion.  Make sure you rest and drink plenty of fluids.  If your symptoms or not improving within a week please return for reevaluation.  If anything worsens and you have a fever, worsening cough, shortness of breath, nausea, vomiting you need to be seen immediately.     ED Prescriptions     Medication Sig Dispense Auth. Provider   fluticasone (FLONASE) 50 MCG/ACT nasal spray Place 2 sprays into both nostrils daily. 16 g Desarie Feild K, PA-C   promethazine-dextromethorphan (PROMETHAZINE-DM) 6.25-15 MG/5ML syrup Take 5 mLs by mouth 2 (two) times daily as needed for cough. 118 mL Fatmata Legere K, PA-C      PDMP not reviewed this encounter.   Jeani Hawking, PA-C 01/22/23 1459

## 2023-01-23 ENCOUNTER — Telehealth: Payer: 59 | Admitting: Physician Assistant

## 2023-01-23 DIAGNOSIS — J069 Acute upper respiratory infection, unspecified: Secondary | ICD-10-CM

## 2023-01-23 LAB — SARS CORONAVIRUS 2 (TAT 6-24 HRS): SARS Coronavirus 2: NEGATIVE

## 2023-01-23 NOTE — Patient Instructions (Signed)
  Erik White, thank you for joining Tylene Fantasia Ward, PA-C for today's virtual visit.  While this provider is not your primary care provider (PCP), if your PCP is located in our provider database this encounter information will be shared with them immediately following your visit.   A [place] MyChart account gives you access to today's visit and all your visits, tests, and labs performed at [facility] " click here if you don't have a [facility] MyChart account or go to [email or web address]  Consent: (Patient) Erik White provided verbal consent for this virtual visit at the beginning of the encounter.  Current Medications:  Current Outpatient Medications:    fluticasone (FLONASE) 50 MCG/ACT nasal spray, Place 2 sprays into both nostrils daily., Disp: 16 g, Rfl: 0   hydrochlorothiazide (HYDRODIURIL) 12.5 MG tablet, Take 1 tablet (12.5 mg total) by mouth daily., Disp: 90 tablet, Rfl: 1   meloxicam (MOBIC) 15 MG tablet, One tab PO every 24 hours with a meal for 2 weeks, then once every 24 hours prn pain., Disp: 30 tablet, Rfl: 3   promethazine-dextromethorphan (PROMETHAZINE-DM) 6.25-15 MG/5ML syrup, Take 5 mLs by mouth 2 (two) times daily as needed for cough., Disp: 118 mL, Rfl: 0   Medications ordered in this encounter:  No orders of the defined types were placed in this encounter.    *If you need refills on other medications prior to your next appointment, please contact your pharmacy*  Follow-Up: Call back or seek an in-person evaluation if the symptoms worsen or if the condition fails to improve as anticipated.  [facility] [phone]  Other Instructions  Use flonase daily, recommend Mucinex.  Use cough syrup as needed.  Continue with ibuprofen or tylenol as needed for fever, pain.  Drink plenty of fluids, rest.  If you develop shortness of breath, trouble breathing go to the Urgent Care for evaluation.   If you have been instructed to  have an in-person evaluation today at a local Urgent Care facility, please use the link below. It will take you to a list of all of our available [facility], including address, phone number and hours of operation. Please do not delay care.  [facility]  If you or a family member do not have a primary care provider, use the link below to schedule a visit and establish care. When you choose a [facility] primary care physician or advanced practice provider, you gain a long-term partner in health. Find a Primary Care Provider  Learn more about [facility] in-office and virtual care options:  - Get Care Now

## 2023-01-23 NOTE — Progress Notes (Signed)
Virtual Visit Consent   [name], you are scheduled for a virtual visit with a [facility] provider today. Just as with appointments in the office, your consent must be obtained to participate. Your consent will be active for this visit and any virtual visit you may have with one of our providers in the next 365 days. If you have a MyChart account, a copy of this consent can be sent to you electronically.  As this is a virtual visit, video technology does not allow for your provider to perform a traditional examination. This may limit your provider's ability to fully assess your condition. If your provider identifies any concerns that need to be evaluated in person or the need to arrange testing (such as labs, EKG, etc.), we will make arrangements to do so. Although advances in technology are sophisticated, we cannot ensure that it will always work on either your end or our end. If the connection with a video visit is poor, the visit may have to be switched to a telephone visit. With either a video or telephone visit, we are not always able to ensure that we have a secure connection.  By engaging in this virtual visit, you consent to the provision of healthcare and authorize for your insurance to be billed (if applicable) for the services provided during this visit. Depending on your insurance coverage, you may receive a charge related to this service.  I need to obtain your verbal consent now. Are you willing to proceed with your visit today? Erik White has provided verbal consent on 01/23/2023 for a virtual visit (video or telephone). Tylene Fantasia Ward, PA-C  Date: 01/23/2023 2:34 PM  Virtual Visit via Video Note   I, Tylene Fantasia Ward, connected with  Erik White  ([id number], Mar 05, 1996) on 01/23/23 at  2:30 PM EDT by a video-enabled telemedicine application and verified that I am speaking with the correct person using two identifiers.  Location: Patient: Virtual Visit Location Patient:  Home Provider: Virtual Visit Location Provider: Home Office   I discussed the limitations of evaluation and management by telemedicine and the availability of in person appointments. The patient expressed understanding and agreed to proceed.    History of Present Illness: Erik White is a 27 y.o. who identifies as a male who was assigned male at birth, and is being seen today for congestion, post nasal drip, cough.  He is taking ibuprofen with minimal relief. Prescribed flonase and cough syrup in UC yesterday. Denies wheezing, body aches, fever.  He denies h/o asthma.  He's is on day 4 of symptoms.   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   Patellar tendonitis of left knee 04/20/2016    Allergies:  Allergies  Allergen Reactions   Other Rash    Neoprene - synthetic rubber   Medications:  Current Outpatient Medications:    fluticasone (FLONASE) 50 MCG/ACT nasal spray, Place 2 sprays into both nostrils daily., Disp: 16 g, Rfl: 0   hydrochlorothiazide (HYDRODIURIL) 12.5  MG tablet, Take 1 tablet (12.5 mg total) by mouth daily., Disp: 90 tablet, Rfl: 1   meloxicam (MOBIC) 15 MG tablet, One tab PO every 24 hours with a meal for 2 weeks, then once every 24 hours prn pain., Disp: 30 tablet, Rfl: 3   promethazine-dextromethorphan (PROMETHAZINE-DM) 6.25-15 MG/5ML syrup, Take 5 mLs by mouth 2 (two) times daily as needed for cough., Disp: 118 mL, Rfl: 0  Observations/Objective: Patient is well-developed, well-nourished  in no acute distress.  Resting comfortably at home.  Head is normocephalic, atraumatic.  No labored breathing.  Speech is clear and coherent with logical content.  Patient is alert and oriented at baseline.    Assessment and Plan: 1. Viral upper respiratory tract infection  Supportive care discussed.   Follow Up Instructions: I discussed the assessment and treatment plan with the patient. The patient was provided an opportunity to ask questions and all were answered. The patient agreed with the plan and demonstrated an understanding of the instructions.  A copy of instructions were sent to the patient via MyChart unless otherwise noted below.     The patient was advised to call back or seek an in-person evaluation if the symptoms worsen or if the condition fails to improve as anticipated.  Time:  I spent 7 minutes with the patient via telehealth technology discussing the above problems/concerns.    Tylene Fantasia Ward, PA-C

## 2023-03-03 ENCOUNTER — Encounter: Payer: Self-pay | Admitting: Family Medicine

## 2023-03-03 ENCOUNTER — Ambulatory Visit (INDEPENDENT_AMBULATORY_CARE_PROVIDER_SITE_OTHER): Payer: 59 | Admitting: Family Medicine

## 2023-03-03 VITALS — BP 129/84 | HR 89 | Ht 73.0 in | Wt 276.0 lb

## 2023-03-03 DIAGNOSIS — M5432 Sciatica, left side: Secondary | ICD-10-CM | POA: Diagnosis not present

## 2023-03-03 DIAGNOSIS — N5089 Other specified disorders of the male genital organs: Secondary | ICD-10-CM | POA: Diagnosis not present

## 2023-03-03 DIAGNOSIS — N442 Benign cyst of testis: Secondary | ICD-10-CM

## 2023-03-03 DIAGNOSIS — I10 Essential (primary) hypertension: Secondary | ICD-10-CM

## 2023-03-03 MED ORDER — HYDROCHLOROTHIAZIDE 12.5 MG PO TABS: 12.5000 mg | ORAL_TABLET | Freq: Every day | ORAL | 1 refills | Status: DC

## 2023-03-03 MED ORDER — PREDNISONE 50 MG PO TABS: ORAL_TABLET | ORAL | 0 refills | Status: DC

## 2023-03-03 NOTE — Patient Instructions (Addendum)
Start prednisone and home exercises.  You will be contacted for scrotal ultrasound

## 2023-03-07 DIAGNOSIS — M5430 Sciatica, unspecified side: Secondary | ICD-10-CM | POA: Insufficient documentation

## 2023-03-07 NOTE — Assessment & Plan Note (Signed)
Blood pressure remains fairly well-controlled.  He will continue hydrochlorothiazide at current strength.

## 2023-03-07 NOTE — Progress Notes (Signed)
Erik White - 27 y.o. male MRN [id number]  Date of birth: Feb 10, 1996  Subjective Chief Complaint  Patient presents with   Leg Pain    HPI Erik White here today for follow-up.  Continues on HCTZ 12.5 mg daily for management of hypertension.  Doing well with this.  Denies side effects at this time.  He has not had chest pain, shortness of breath, palpitations, headaches or vision changes.  I have some pain in the upper leg.  Mostly in the posterior leg radiating from the buttock.  Certain movements do make the leg pain worse such as flexion.  Does not recall any specific injury.    History of scrotal nodule but feels like this has gotten larger.  Occasional pain.  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    Past Surgical History:  Procedure Laterality Date   APPENDECTOMY     LAPAROSCOPIC APPENDECTOMY N/A 04/04/2020   Procedure: APPENDECTOMY LAPAROSCOPIC;  Surgeon: Darnell Level, MD;  Location: [facility];  Service: General;  Laterality: N/A;   SHOULDER ARTHROSCOPY WITH LABRAL REPAIR Left 05/28/2016   Procedure: LEFT SHOULDER ARTHROSCOPY WITH ANTERIOR AND POSTERIOR CAPSULOLABRAL RECONSTRUCTION;  Surgeon: Francena Hanly, MD;  Location: [facility];  Service: Orthopedics;  Laterality: Left;   WISDOM TOOTH EXTRACTION      Social History   Socioeconomic History   Marital status: Single    Spouse name: Not on file   Number of children: Not on file   Years of education: Not on file   Highest education level: Not on file  Occupational History   Not on file  Tobacco Use   Smoking status: Never   Smokeless tobacco: Never  Vaping Use   Vaping Use: Never used  Substance and Sexual Activity   Alcohol use: No   Drug use: No   Sexual activity: Yes  Other Topics Concern    Not on file  Social History Narrative   Not on file   Social Determinants of Health   Financial Resource Strain: Not on file  Food Insecurity: Not on file  Transportation Needs: Not on file  Physical Activity: Not on file  Stress: Not on file  Social Connections: Not on file    Family History  Problem Relation Age of Onset   Heart attack Father    Cancer Other        lung   Cancer Other        liver CA   Thyroid disease Mother    Hypertension Mother     Health Maintenance  Topic Date Due   COVID-19 Vaccine (1) 06/19/2023 (Originally 03/05/1998)   HPV VACCINES (1 - Male 2-dose series) 03/02/2024 (Originally 09/04/2008)   Hepatitis C Screening  03/02/2024 (Originally 09/05/2015)   INFLUENZA VACCINE  05/06/2023   DTaP/Tdap/Td (2 - Td or Tdap) 04/25/2028   HIV Screening  Completed     ----------------------------------------------------------------------------------------------------------------------------------------------------------------------------------------------------------------- Physical Exam BP 129/84 (BP Location: Left Arm, Patient Position: Sitting, Cuff Size: Large)   Pulse 89   Ht 6\' 1"  (1.854 m)   Wt 276 lb (125.2 kg)   SpO2 100%   BMI 36.41 kg/m   Physical Exam Constitutional:      Appearance: Normal appearance.  HENT:  Head: Normocephalic and atraumatic.  Eyes:     General: No scleral icterus. Cardiovascular:     Rate and Rhythm: Normal rate and regular rhythm.  Pulmonary:     Effort: Pulmonary effort is normal.     Breath sounds: Normal breath sounds.  Musculoskeletal:     Comments: Range of motion of hip and knee are normal.  He does have some increased pain with straight leg raise on the left.  Increased pain and tightness with piriformis testing.  Neurological:     Mental Status: He is alert.      ------------------------------------------------------------------------------------------------------------------------------------------------------------------------------------------------------------------- Assessment and Plan  Testicular cyst He feels like this is getting larger.  Updated testicular ultrasound ordered.  Essential hypertension Blood pressure remains fairly well-controlled.  He will continue hydrochlorothiazide at current strength.  Sciatica He is having some sciatica symptoms.  Tightness of the piriformis which may be contributing.  Given handout of stretches to try at home.  Adding burst of prednisone as well.   Meds ordered this encounter  Medications   hydrochlorothiazide (HYDRODIURIL) 12.5 MG tablet    Sig: Take 1 tablet (12.5 mg total) by mouth daily.    Dispense:  90 tablet    Refill:  1   predniSONE (DELTASONE) 50 MG tablet    Sig: Take 50mg  daily x5 days    Dispense:  5 tablet    Refill:  0    No follow-ups on file.    This visit occurred during the SARS-CoV-2 public health emergency.  Safety protocols were in place, including screening questions prior to the visit, additional usage of staff PPE, and extensive cleaning of exam room while observing appropriate contact time as indicated for disinfecting solutions.

## 2023-03-07 NOTE — Assessment & Plan Note (Signed)
He is having some sciatica symptoms.  Tightness of the piriformis which may be contributing.  Given handout of stretches to try at home.  Adding burst of prednisone as well.

## 2023-03-07 NOTE — Assessment & Plan Note (Signed)
He feels like this is getting larger.  Updated testicular ultrasound ordered.

## 2023-03-08 ENCOUNTER — Encounter: Payer: 59 | Admitting: Family Medicine

## 2023-03-16 ENCOUNTER — Encounter: Payer: Self-pay | Admitting: Family Medicine

## 2023-03-16 ENCOUNTER — Ambulatory Visit (INDEPENDENT_AMBULATORY_CARE_PROVIDER_SITE_OTHER): Payer: 59 | Admitting: Family Medicine

## 2023-03-16 ENCOUNTER — Ambulatory Visit (INDEPENDENT_AMBULATORY_CARE_PROVIDER_SITE_OTHER): Payer: 59

## 2023-03-16 VITALS — BP 122/83 | HR 73 | Ht 73.0 in | Wt 271.4 lb

## 2023-03-16 DIAGNOSIS — Z13220 Encounter for screening for lipoid disorders: Secondary | ICD-10-CM

## 2023-03-16 DIAGNOSIS — R29818 Other symptoms and signs involving the nervous system: Secondary | ICD-10-CM

## 2023-03-16 DIAGNOSIS — M5432 Sciatica, left side: Secondary | ICD-10-CM

## 2023-03-16 DIAGNOSIS — Z0000 Encounter for general adult medical examination without abnormal findings: Secondary | ICD-10-CM | POA: Diagnosis not present

## 2023-03-16 DIAGNOSIS — N5089 Other specified disorders of the male genital organs: Secondary | ICD-10-CM | POA: Diagnosis not present

## 2023-03-16 DIAGNOSIS — I10 Essential (primary) hypertension: Secondary | ICD-10-CM | POA: Diagnosis not present

## 2023-03-16 HISTORY — DX: Encounter for general adult medical examination without abnormal findings: Z00.00

## 2023-03-16 NOTE — Assessment & Plan Note (Signed)
Well adult Orders Placed This Encounter  Procedures   COMPLETE METABOLIC PANEL WITH GFR   CBC with Differential   Lipid Panel w/reflex Direct LDL   TSH   Ambulatory referral to Physical Therapy    Referral Priority:   Routine    Referral Type:   Physical Medicine    Referral Reason:   Specialty Services Required    Requested Specialty:   Physical Therapy    Number of Visits Requested:   1   Home sleep test    Standing Status:   Future    Standing Expiration Date:   03/15/2024    Order Specific Question:   Where should this test be performed:    Answer:   [facility]  Screenings: per lab orders Immunizations:  UTD Anticipatory guidance/Risk factor reduction:  Recommendations per AVS.     

## 2023-03-16 NOTE — Progress Notes (Signed)
Erik White - 27 y.o. male MRN [id number]  Date of birth: 07/07/1996  Subjective Chief Complaint  Patient presents with   Annual Exam    HPI Erik White is a 27 y.o. male here today for annual exam.   He reports that he is doing pretty well.  His significant other accompanies him to his appointment today and raises a couple of concerns.  Has had some increased reflux symptoms.  He has this on nearly a daily basis.  Typically worse at night.  Has not correlated any certain foods with this.  Uses Pepcid occasionally but does not feel like this works very well for him.  Additionally she is concerned about sleep apnea.  He is very fatigued during the day.  He snores heavily at night.  He continues to deal with sciatica on the left side.  Steroids did help for a short period of time.  He has been doing home stretches.  He is moderately active.  He feels that diet is pretty good most the time.  Non-smoker.  No significant alcohol use.  Review of Systems  Constitutional:  Negative for chills, fever, malaise/fatigue and weight loss.  HENT:  Negative for congestion, ear pain and sore throat.   Eyes:  Negative for blurred vision, double vision and pain.  Respiratory:  Negative for cough and shortness of breath.   Cardiovascular:  Negative for chest pain and palpitations.  Gastrointestinal:  Negative for abdominal pain, blood in stool, constipation, heartburn and nausea.  Genitourinary:  Negative for dysuria and urgency.  Musculoskeletal:  Negative for joint pain and myalgias.  Neurological:  Negative for dizziness and headaches.  Endo/Heme/Allergies:  Does not bruise/bleed easily.  Psychiatric/Behavioral:  Negative for depression. The patient is not nervous/anxious and does not have insomnia.      No Known Allergies  Past Medical History:  Diagnosis Date   GERD (gastroesophageal reflux disease)    Headache    migraines   Hypertension    Mesenteric adenitis 2005   Testicular  cyst 03/10/2018   Well adult exam 03/16/2023    Past Surgical History:  Procedure Laterality Date   APPENDECTOMY     LAPAROSCOPIC APPENDECTOMY N/A 04/04/2020   Procedure: APPENDECTOMY LAPAROSCOPIC;  Surgeon: Darnell Level, MD;  Location: [facility];  Service: General;  Laterality: N/A;   SHOULDER ARTHROSCOPY WITH LABRAL REPAIR Left 05/28/2016   Procedure: LEFT SHOULDER ARTHROSCOPY WITH ANTERIOR AND POSTERIOR CAPSULOLABRAL RECONSTRUCTION;  Surgeon: Francena Hanly, MD;  Location: [facility];  Service: Orthopedics;  Laterality: Left;   WISDOM TOOTH EXTRACTION      Social History   Socioeconomic History   Marital status: Single    Spouse name: Not on file   Number of children: Not on file   Years of education: Not on file   Highest education level: Not on file  Occupational History   Not on file  Tobacco Use   Smoking status: Never   Smokeless tobacco: Never  Vaping Use   Vaping Use: Never used  Substance and Sexual Activity   Alcohol use: No   Drug use: No   Sexual activity: Yes  Other Topics Concern   Not on file  Social History Narrative   Not on file   Social Determinants of Health   Financial Resource Strain: Not on file  Food Insecurity: Not on file  Transportation Needs: Not on file  Physical Activity: Not on file  Stress: Not on file  Social Connections: Not on file  Family History  Problem Relation Age of Onset   Heart attack Father    Cancer Other        lung   Cancer Other        liver CA   Thyroid disease Mother    Hypertension Mother     Health Maintenance  Topic Date Due   COVID-19 Vaccine (1) 06/19/2023 (Originally 03/05/1998)   HPV VACCINES (1 - Male 2-dose series) 03/02/2024 (Originally 09/04/2008)   Hepatitis C Screening  03/02/2024 (Originally 09/05/2015)   INFLUENZA VACCINE  05/06/2023   DTaP/Tdap/Td (2 - Td or Tdap) 04/25/2028   HIV Screening  Completed      ----------------------------------------------------------------------------------------------------------------------------------------------------------------------------------------------------------------- Physical Exam BP 122/83 (BP Location: Right Arm, Patient Position: Sitting, Cuff Size: Large)   Pulse 73   Ht 6\' 1"  (1.854 m)   Wt 271 lb 6.4 oz (123.1 kg)   SpO2 100%   BMI 35.81 kg/m   Physical Exam Constitutional:      General: He is not in acute distress. HENT:     Head: Normocephalic and atraumatic.     Right Ear: Tympanic membrane and external ear normal.     Left Ear: Tympanic membrane and external ear normal.  Eyes:     General: No scleral icterus. Neck:     Thyroid: No thyromegaly.  Cardiovascular:     Rate and Rhythm: Normal rate and regular rhythm.     Heart sounds: Normal heart sounds.  Pulmonary:     Effort: Pulmonary effort is normal.     Breath sounds: Normal breath sounds.  Abdominal:     General: Bowel sounds are normal. There is no distension.     Palpations: Abdomen is soft.     Tenderness: There is no abdominal tenderness. There is no guarding.  Musculoskeletal:     Cervical back: Normal range of motion.  Lymphadenopathy:     Cervical: No cervical adenopathy.  Skin:    General: Skin is warm and dry.     Findings: No rash.  Neurological:     Mental Status: He is alert and oriented to person, place, and time.     Cranial Nerves: No cranial nerve deficit.     Motor: No abnormal muscle tone.  Psychiatric:        Mood and Affect: Mood normal.        Behavior: Behavior normal.     ------------------------------------------------------------------------------------------------------------------------------------------------------------------------------------------------------------------- Assessment and Plan  Well adult exam Well adult Orders Placed This Encounter  Procedures   COMPLETE METABOLIC PANEL WITH GFR   CBC with  Differential   Lipid Panel w/reflex Direct LDL   TSH   Ambulatory referral to Physical Therapy    Referral Priority:   Routine    Referral Type:   Physical Medicine    Referral Reason:   Specialty Services Required    Requested Specialty:   Physical Therapy    Number of Visits Requested:   1   Home sleep test    Standing Status:   Future    Standing Expiration Date:   03/15/2024    Order Specific Question:   Where should this test be performed:    Answer:   [facility]  Screenings: per lab orders Immunizations:  UTD Anticipatory guidance/Risk factor reduction:  Recommendations per AVS.     Suspected sleep apnea Referral for home sleep test.    No orders of the defined types were placed in this encounter.   No follow-ups on file.  This visit occurred during the SARS-CoV-2 public health emergency.  Safety protocols were in place, including screening questions prior to the visit, additional usage of staff PPE, and extensive cleaning of exam room while observing appropriate contact time as indicated for disinfecting solutions.

## 2023-03-16 NOTE — Assessment & Plan Note (Signed)
Referral for home sleep test.

## 2023-03-16 NOTE — Patient Instructions (Signed)
Preventive Care 21-26 Years Old, Male Preventive care refers to lifestyle choices and visits with your health care provider that can promote health and wellness. Preventive care visits are also called wellness exams. What can I expect for my preventive care visit? Counseling During your preventive care visit, your health care provider may ask about your: Medical history, including: Past medical problems. Family medical history. Current health, including: Emotional well-being. Home life and relationship well-being. Sexual activity. Lifestyle, including: Alcohol, nicotine or tobacco, and drug use. Access to firearms. Diet, exercise, and sleep habits. Safety issues such as seatbelt and bike helmet use. Sunscreen use. Work and work environment. Physical exam Your health care provider may check your: Height and weight. These may be used to calculate your BMI (body mass index). BMI is a measurement that tells if you are at a healthy weight. Waist circumference. This measures the distance around your waistline. This measurement also tells if you are at a healthy weight and may help predict your risk of certain diseases, such as type 2 diabetes and high blood pressure. Heart rate and blood pressure. Body temperature. Skin for abnormal spots. What immunizations do I need?  Vaccines are usually given at various ages, according to a schedule. Your health care provider will recommend vaccines for you based on your age, medical history, and lifestyle or other factors, such as travel or where you work. What tests do I need? Screening Your health care provider may recommend screening tests for certain conditions. This may include: Lipid and cholesterol levels. Diabetes screening. This is done by checking your blood sugar (glucose) after you have not eaten for a while (fasting). Hepatitis B test. Hepatitis C test. HIV (human immunodeficiency virus) test. STI (sexually transmitted infection)  testing, if you are at risk. Talk with your health care provider about your test results, treatment options, and if necessary, the need for more tests. Follow these instructions at home: Eating and drinking  Eat a healthy diet that includes fresh fruits and vegetables, whole grains, lean protein, and low-fat dairy products. Drink enough fluid to keep your urine pale yellow. Take vitamin and mineral supplements as recommended by your health care provider. Do not drink alcohol if your health care provider tells you not to drink. If you drink alcohol: Limit how much you have to 0-2 drinks a day. Know how much alcohol is in your drink. In the [place] one drink equals one 12 oz bottle of beer (355 mL), one 5 oz glass of wine (148 mL), or one 1 oz glass of hard liquor (44 mL). Lifestyle Brush your teeth every morning and night with fluoride toothpaste. Floss one time each day. Exercise for at least 30 minutes 5 or more days each week. Do not use any products that contain nicotine or tobacco. These products include cigarettes, chewing tobacco, and vaping devices, such as e-cigarettes. If you need help quitting, ask your health care provider. Do not use drugs. If you are sexually active, practice safe sex. Use a condom or other form of protection to prevent STIs. Find healthy ways to manage stress, such as: Meditation, yoga, or listening to music. Journaling. Talking to a trusted person. Spending time with friends and family. Minimize exposure to UV radiation to reduce your risk of skin cancer. Safety Always wear your seat belt while driving or riding in a vehicle. Do not drive: If you have been drinking alcohol. Do not ride with someone who has been drinking. If you have been using any mind-altering substances   or drugs. While texting. When you are tired or distracted. Wear a helmet and other protective equipment during sports activities. If you have firearms in your house, make sure you  follow all gun safety procedures. Seek help if you have been physically or sexually abused. What's next? Go to your health care provider once a year for an annual wellness visit. Ask your health care provider how often you should have your eyes and teeth checked. Stay up to date on all vaccines. This information is not intended to replace advice given to you by your health care provider. Make sure you discuss any questions you have with your health care provider. Document Revised: 03/19/2021 Document Reviewed: 03/19/2021 Elsevier Patient Education  2024 Elsevier Inc.  

## 2023-03-16 NOTE — Assessment & Plan Note (Deleted)
Well adult Orders Placed This Encounter  Procedures   COMPLETE METABOLIC PANEL WITH GFR   CBC with Differential   Lipid Panel w/reflex Direct LDL   TSH   Ambulatory referral to Physical Therapy    Referral Priority:   Routine    Referral Type:   Physical Medicine    Referral Reason:   Specialty Services Required    Requested Specialty:   Physical Therapy    Number of Visits Requested:   1   Home sleep test    Standing Status:   Future    Standing Expiration Date:   03/15/2024    Order Specific Question:   Where should this test be performed:    Answer:   [facility]  Screenings: per lab orders Immunizations:  UTD Anticipatory guidance/Risk factor reduction:  Recommendations per AVS.

## 2023-03-17 LAB — COMPLETE METABOLIC PANEL WITH GFR
AG Ratio: 2.2 (calc) (ref 1.0–2.5)
ALT: 43 U/L (ref 9–46)
AST: 20 U/L (ref 10–40)
Albumin: 4.7 g/dL (ref 3.6–5.1)
Alkaline phosphatase (APISO): 72 U/L (ref 36–130)
BUN: 17 mg/dL (ref 7–25)
CO2: 28 mmol/L (ref 20–32)
Calcium: 9.7 mg/dL (ref 8.6–10.3)
Chloride: 105 mmol/L (ref 98–110)
Creat: 0.9 mg/dL (ref 0.60–1.24)
Globulin: 2.1 g/dL (calc) (ref 1.9–3.7)
Glucose, Bld: 99 mg/dL (ref 65–99)
Potassium: 4.6 mmol/L (ref 3.5–5.3)
Sodium: 141 mmol/L (ref 135–146)
Total Bilirubin: 0.9 mg/dL (ref 0.2–1.2)
Total Protein: 6.8 g/dL (ref 6.1–8.1)
eGFR: 122 mL/min/{1.73_m2} (ref >=60)

## 2023-03-17 LAB — CBC WITH DIFFERENTIAL/PLATELET
Absolute Monocytes: 561 cells/uL (ref 200–950)
Basophils Absolute: 19 cells/uL (ref 0–200)
Basophils Relative: 0.2 %
Eosinophils Absolute: 67 cells/uL (ref 15–500)
Eosinophils Relative: 0.7 %
HCT: 47.1 % (ref 38.5–50.0)
Hemoglobin: 15.7 g/dL (ref 13.2–17.1)
Lymphs Abs: 2280 cells/uL (ref 850–3900)
MCH: 29.6 pg (ref 27.0–33.0)
MCHC: 33.3 g/dL (ref 32.0–36.0)
MCV: 88.7 fL (ref 80.0–100.0)
MPV: 11.4 fL (ref 7.5–12.5)
Monocytes Relative: 5.9 %
Neutro Abs: 6574 cells/uL (ref 1500–7800)
Neutrophils Relative %: 69.2 %
Platelets: 222 10*3/uL (ref 140–400)
RBC: 5.31 10*6/uL (ref 4.20–5.80)
RDW: 12.9 % (ref 11.0–15.0)
Total Lymphocyte: 24 %
WBC: 9.5 10*3/uL (ref 3.8–10.8)

## 2023-03-17 LAB — TSH: TSH: 1.3 mIU/L (ref 0.40–4.50)

## 2023-03-17 LAB — LIPID PANEL W/REFLEX DIRECT LDL
Cholesterol: 248 mg/dL — ABNORMAL HIGH (ref <200)
HDL: 39 mg/dL — ABNORMAL LOW (ref >=40)
LDL Cholesterol (Calc): 168 mg/dL (calc) — ABNORMAL HIGH
Non-HDL Cholesterol (Calc): 209 mg/dL (calc) — ABNORMAL HIGH (ref <130)
Total CHOL/HDL Ratio: 6.4 (calc) — ABNORMAL HIGH (ref <5.0)
Triglycerides: 241 mg/dL — ABNORMAL HIGH (ref <150)

## 2023-04-07 IMAGING — DX DG TIBIA/FIBULA 2V*R*
4 series · 4 of 4 positions shown · non-contrast
Comparison: None.

CLINICAL DATA: Right lower leg bruising.

EXAM:
RIGHT TIBIA AND FIBULA - 2 VIEW

[tib/fib ap (1 of 2)]
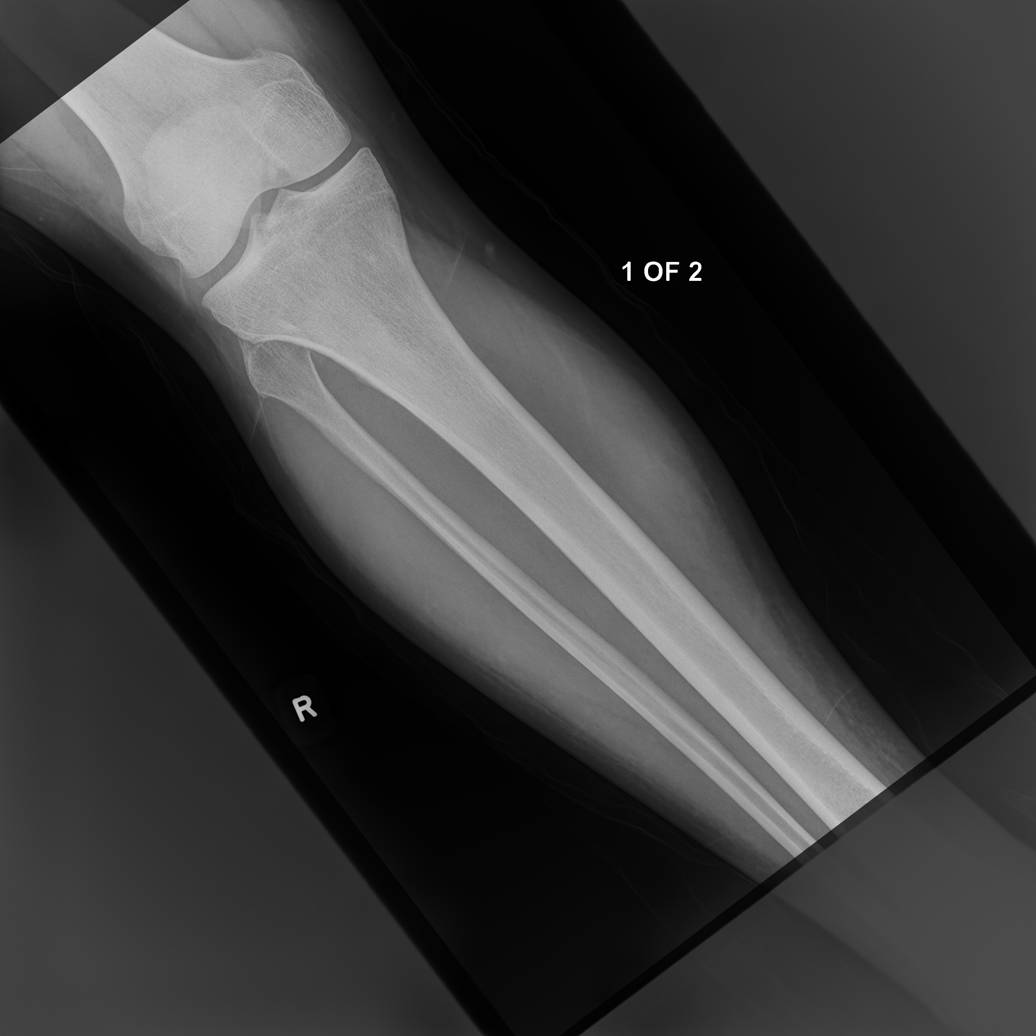

[tib/fib lat (1 of 2)]
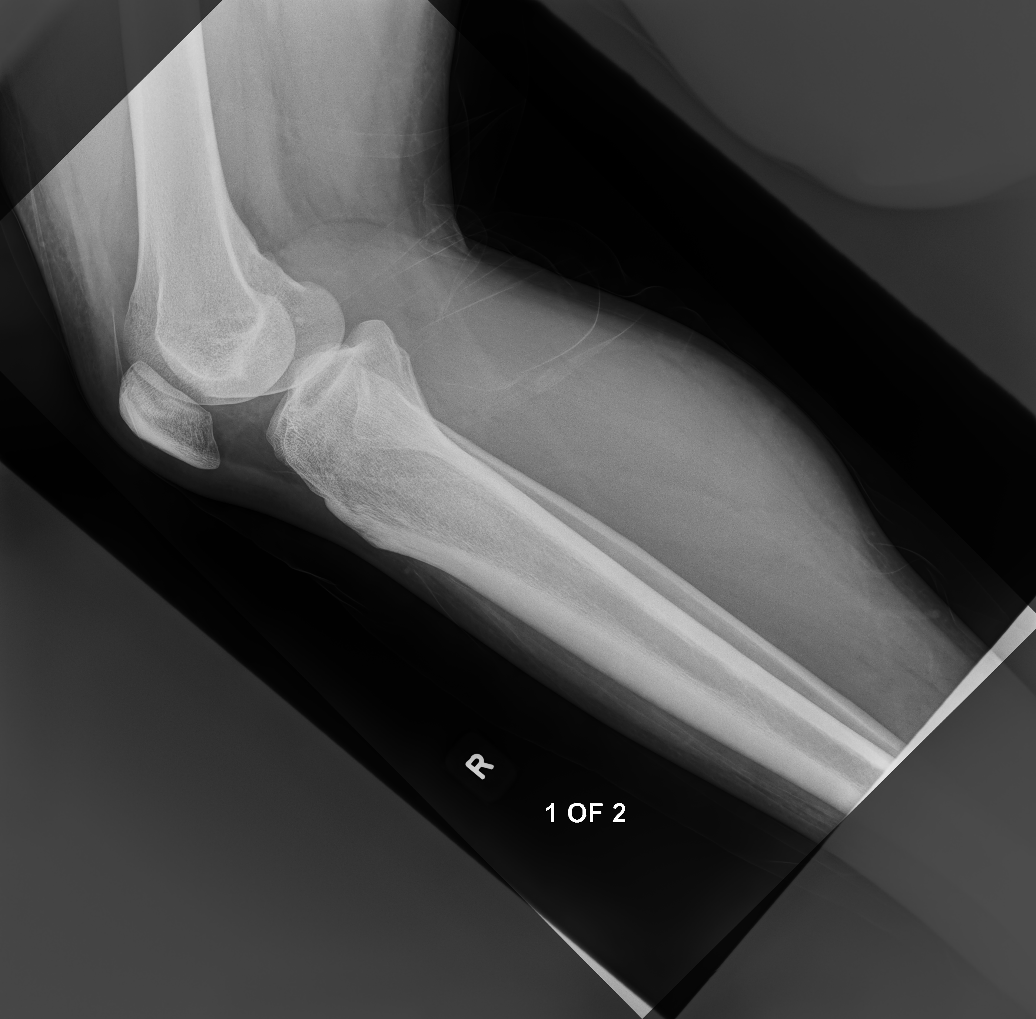

[tib/fib ap (2 of 2)]
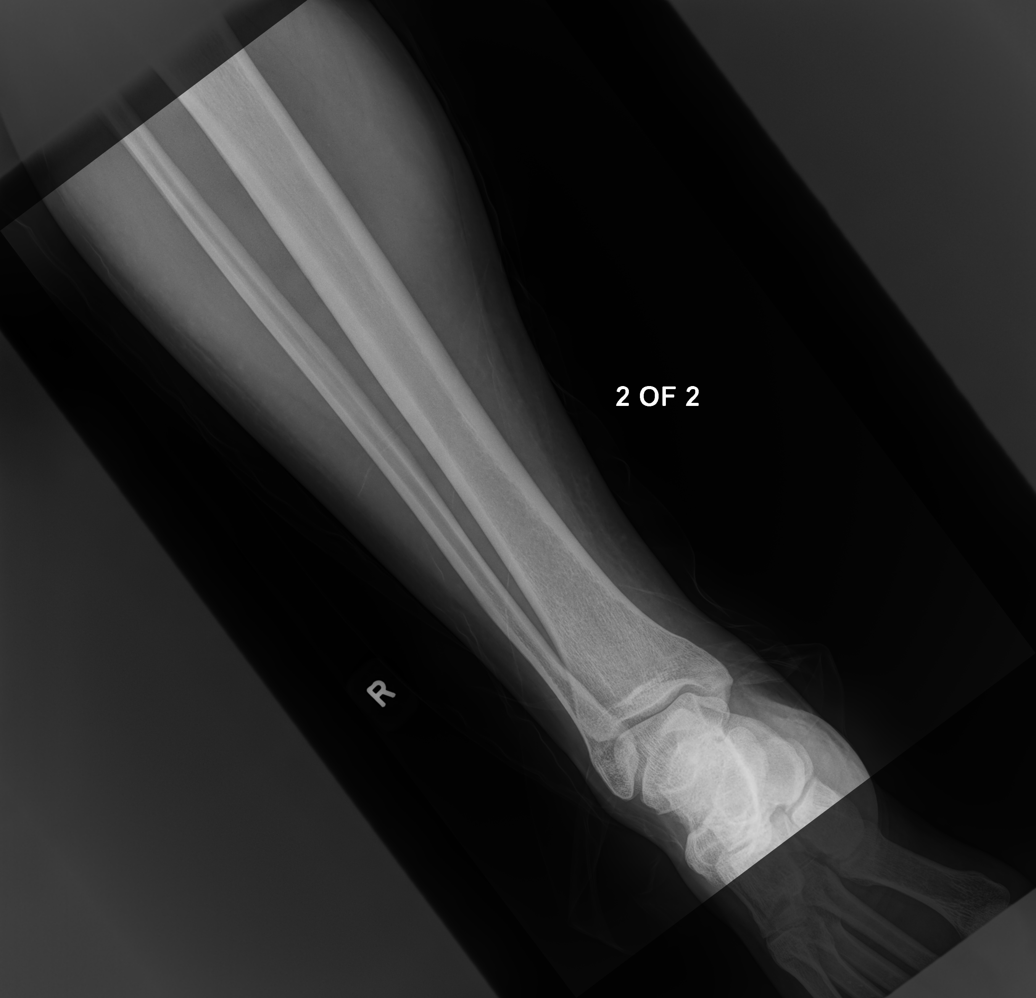

[tib/fib lat (2 of 2)]
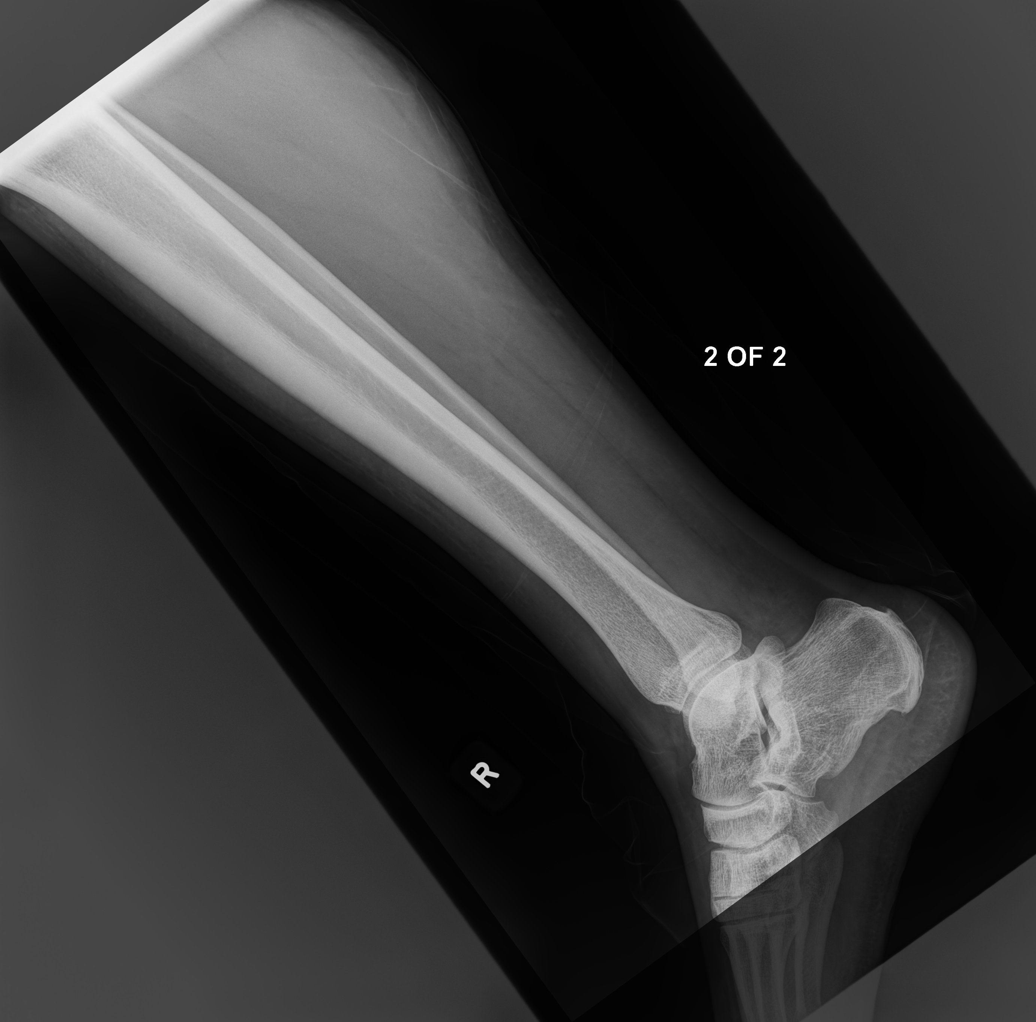

[4 of 4 positions shown; findings below may reference images not displayed]

FINDINGS: There is no evidence of fracture or other focal bone lesions. Soft
tissues are unremarkable.
IMPRESSION: No acute bone abnormality to the right lower leg.

## 2023-04-22 ENCOUNTER — Other Ambulatory Visit: Payer: Self-pay

## 2023-04-22 ENCOUNTER — Encounter: Payer: Self-pay | Admitting: Rehabilitative and Restorative Service Providers"

## 2023-04-22 ENCOUNTER — Ambulatory Visit: Payer: 59 | Attending: Family Medicine | Admitting: Rehabilitative and Restorative Service Providers"

## 2023-04-22 DIAGNOSIS — M25511 Pain in right shoulder: Secondary | ICD-10-CM | POA: Diagnosis not present

## 2023-04-22 DIAGNOSIS — M5432 Sciatica, left side: Secondary | ICD-10-CM | POA: Insufficient documentation

## 2023-04-22 DIAGNOSIS — M6281 Muscle weakness (generalized): Secondary | ICD-10-CM | POA: Insufficient documentation

## 2023-04-22 DIAGNOSIS — R29898 Other symptoms and signs involving the musculoskeletal system: Secondary | ICD-10-CM | POA: Diagnosis not present

## 2023-04-22 NOTE — Therapy (Signed)
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   Well adult exam 03/16/2023   Past Surgical History:  Procedure Laterality Date   APPENDECTOMY     LAPAROSCOPIC APPENDECTOMY N/A 04/04/2020   Procedure: APPENDECTOMY LAPAROSCOPIC;  Surgeon: Darnell Level, MD;  Location: [facility];  Service: General;  Laterality: N/A;   SHOULDER ARTHROSCOPY WITH LABRAL REPAIR Left 05/28/2016   Procedure: LEFT SHOULDER ARTHROSCOPY WITH ANTERIOR AND POSTERIOR CAPSULOLABRAL RECONSTRUCTION;  Surgeon: Francena Hanly, MD;  Location: [facility];  Service: Orthopedics;  Laterality: Left;   WISDOM TOOTH EXTRACTION     Patient Active Problem List   Diagnosis Date Noted   Suspected sleep apnea 03/16/2023   Well adult exam 03/16/2023   Sciatica 03/07/2023   Chronic right shoulder pain 01/12/2022   Chest pain 01/03/2022   Eczema 12/30/2021   Ear bleeding, left 04/25/2020   Right lower quadrant abdominal pain 04/03/2020   Acute appendicitis with localized peritonitis 04/03/2020   Obesity (BMI 30-39.9) 04/03/2020   Essential hypertension 05/25/2019   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  ONSET DATE: 03/08/23  SUBJECTIVE SUBJECTIVE:                                                                                                                                                                                           STATEMENT: Patient reports that he noticed L butt cheek pain radiating into the posterior distal thigh with no known injury. He began to notice pain during the day with symptoms about the same in the past 6-7 weeks. Some days are better than others. Worst pain is getting out of bed in the mornings   PERTINENT HISTORY:  History of LBP ~ 5-6 years ago treatment with chiropractic care; history of upper back and shoulder pain; surgery for L shoulder labral tear with no significant improvement following surgery - he has some continued pain in bilat shoulders on an intermittent basis which is worse with upward motions. Appendectomy 2021; HTN  PAIN:  Are you having pain? Yes: NPRS scale: 3/10; best 1/10; worst 8-9/10 Pain location: L posterior buttocks to posterior L distal thigh  Pain description: dull; sharp at times  Aggravating factors: getting out of bed; walking > 20 min; reaching Relieving factors: time; sometimes stretching   PRECAUTIONS: None  RED FLAGS: None   WEIGHT BEARING RESTRICTIONS: No  FALLS:  Has patient fallen in last 6 months? 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 No mass within either testicle.  PATIENT SURVEYS:  FOTO 51 Goal 71  SCREENING FOR RED FLAGS: Bowel or bladder  incontinence: No Spinal tumors: No Cauda equina syndrome: No Compression fracture: No Abdominal aneurysm: No  COGNITION: Overall cognitive status: Within functional limits for tasks assessed     SENSATION: WFL  MUSCLE LENGTH: Hamstrings: Right 65 deg; Left 60 deg Tight hip flexors L/R  POSTURE: rounded shoulders and forward head; UE's in IR at sides; LE's in ER toe out  PALPATION: Muscular tightness bilat hip flexors; L QL, lumbar paraspinals, piriformis, gluts, hamstrings  LUMBAR ROM:   AROM eval  Flexion 60%  Extension 75%  Right lateral flexion 85%  Left lateral flexion 85% discomfort   Right rotation 60%  Left rotation 50% discomfort    (Blank rows = not tested)  LOWER EXTREMITY ROM:  WFL  Active  Right eval Left eval  Hip flexion    Hip extension    Hip abduction    Hip adduction    Hip internal rotation    Hip external rotation    Knee flexion    Knee extension    Ankle dorsiflexion    Ankle plantarflexion    Ankle inversion    Ankle eversion     (Blank rows = not tested)  LOWER EXTREMITY MMT:  functional hip strength not tested resistively   MMT Right eval Left eval  Hip flexion    Hip extension    Hip abduction    Hip adduction    Hip internal rotation    Hip external rotation    Knee flexion    Knee extension 5 5  Ankle dorsiflexion 5 5  Ankle plantarflexion 5 5  Ankle inversion    Ankle eversion     (Blank rows = not tested)  LUMBAR SPECIAL TESTS:  Straight leg raise test: Negative some increased muscular tightness L with testing L  and Slump test: Negative   GAIT: Distance walked: 40 ft Assistive device utilized: None Level of assistance: Complete Independence Comments: slight limp with WB phase L LE   [facility] Adult PT Treatment:  DATE: 04/22/23 Therapeutic Exercise: Prone  Press up 2 sec x 5 Supine  4 part core 10 sec x 10  Hamstring stretch 30 sec x 2  Piriformis strength  travell 30 sec x 2  Sciatic nerve glide 1-2 sec x 8  Standing  Myofacial ball release work L posterior hip using 4 inch plastic ball  Sitting  Use of noodle aling spine to improve sitting posture  Manual Therapy: Add trial of DN at next visit  Neuromuscular re-ed: Postural education  Modalities: Consider trial of TENS for pain management as needed  Self Care: To avoid sleeping in 3/4 prone position - discussed sleeping supine with pillows under knee    PATIENT EDUCATION:  Education details: POC; HEP Person educated: Patient Education method: Explanation, Demonstration, Tactile cues, Verbal cues, and Handouts Education comprehension: verbalized understanding, returned demonstration, verbal cues required, tactile cues required, and needs further education  HOME EXERCISE PROGRAM: Access Code: [email or web address] URL: [email or web address] Date: 04/22/2023 Prepared by: Corlis Leak  Exercises - Prone Press Up  - 2 x daily - 7 x weekly - 1 sets - 10 reps - 2-3 sec  hold - Standing Lumbar Extension  - 2 x daily - 7 x weekly - 1 sets - 2-3 reps - 2-3 sec  hold - Hooklying Hamstring Stretch with Strap  - 2 x daily - 7 x weekly - 1 sets - 3 reps - 30 sec  hold - Supine Piriformis Stretch with Leg Straight  - 2 x daily - 7 x weekly - 1 sets - 3 reps - 30 sec  hold - Supine Sciatic Nerve Glide  - 2 x daily - 7 x weekly - 1 sets - 8-10 reps - 1-2 sec  hold - Supine Transversus Abdominis Bracing with Pelvic Floor Contraction  - 2 x daily - 7 x weekly - 1 sets - 10 reps - 10sec  hold  Patient Education - Healthy Posture: How to Hold and Lift a Baby - Spanish - Office Posture - Trigger Point Dry Needling - TENS Unit  ASSESSMENT:  CLINICAL IMPRESSION: Patient is a 27 y.o. male who was seen today for physical therapy evaluation and treatment for L sciatica. He reports gradual onset of pain over the past 6-7 weeks with some days better than others. Patient has poor posture and alignment;  limited trunk and LE mobility/ROM; decreased spinal mobility; muscular tightness L QL; lats; lumbar paraspinals; piriformis; gluts; bilat hip flexors. Patient has pain with functional activities and especially with getting out of bed in the morning. He has poor core strength and stability. Of note he does sleep in prone with L LE in figure 4 position which would create pain as he presents with. Patient will benefit from PT to address problems identified.   OBJECTIVE IMPAIRMENTS: Abnormal gait, decreased activity tolerance, decreased mobility, decreased ROM, decreased strength, increased fascial restrictions, impaired perceived functional ability, increased muscle spasms, impaired flexibility, improper body mechanics, postural dysfunction, and pain.   ACTIVITY LIMITATIONS: sitting, standing, sleeping, transfers, bed mobility, and reach over head  PARTICIPATION LIMITATIONS: occupation and yard work  PERSONAL FACTORS: Behavior pattern, Fitness, Past/current experiences, Profession, Time since onset of injury/illness/exacerbation, and comorbidities: including history of LBP and generalized hypermobility with poor posture and alignment; poor core strength in upper and lower core are also affecting patient's functional outcome.   REHAB POTENTIAL: Good  CLINICAL DECISION MAKING: Stable/uncomplicated  EVALUATION COMPLEXITY: Low   GOALS: Goals reviewed with patient? Yes  SHORT TERM GOALS: Target date: 06/03/2023  Independent in initial HEP  Baseline: Goal status: INITIAL  2.  Patient reports sleeping in more ergonomically correct positions  Baseline:  Goal status: INITIAL   LONG TERM GOALS: Target date: 07/15/2023   Decrease pain by 75-100% allowing patient to return to all normal functional activities  Baseline:  Goal status: INITIAL  2.  Full pain free trunk and LE mobility/ROM Baseline:  Goal status: INITIAL  3.  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  Patient to report and demonstrate good posture and body mechanics for functional activities including sleeping  Baseline:  Goal status: INITIAL  5.  Independent in HEP including aquatic therapy as indicated  Baseline:  Goal status: INITIAL  6.  Improve functional limitation score to 71  Baseline:  Goal status: INITIAL  PLAN:  PT FREQUENCY: 2x/week  PT DURATION: 12 weeks  PLANNED INTERVENTIONS: Therapeutic exercises, Therapeutic activity, Neuromuscular re-education, Balance training, Gait training, Patient/Family education, Self Care, Joint mobilization, Aquatic Therapy, Dry Needling, Electrical stimulation, Spinal mobilization, Cryotherapy, Moist heat, Taping, Traction, Ultrasound, Ionotophoresis 4mg /ml Dexamethasone, Manual therapy, and Re-evaluation.  PLAN FOR NEXT SESSION: review and progress exercises; continue spine care and postural education; manual work, DN, modalities as indicated    [employer], PT 04/22/2023, 8:31 AM

## 2023-04-28 ENCOUNTER — Ambulatory Visit: Payer: 59 | Attending: Family Medicine | Admitting: Pulmonary Disease

## 2023-04-28 ENCOUNTER — Encounter: Payer: Self-pay | Admitting: Physical Therapy

## 2023-04-28 DIAGNOSIS — R29818 Other symptoms and signs involving the nervous system: Secondary | ICD-10-CM | POA: Diagnosis not present

## 2023-04-28 DIAGNOSIS — R0683 Snoring: Secondary | ICD-10-CM | POA: Insufficient documentation

## 2023-04-29 ENCOUNTER — Encounter: Payer: Self-pay | Admitting: Physical Therapy

## 2023-04-29 ENCOUNTER — Ambulatory Visit: Payer: 59 | Admitting: Physical Therapy

## 2023-04-29 DIAGNOSIS — M5432 Sciatica, left side: Secondary | ICD-10-CM | POA: Diagnosis not present

## 2023-04-29 DIAGNOSIS — M6281 Muscle weakness (generalized): Secondary | ICD-10-CM | POA: Diagnosis not present

## 2023-04-29 DIAGNOSIS — M25511 Pain in right shoulder: Secondary | ICD-10-CM | POA: Diagnosis not present

## 2023-04-29 DIAGNOSIS — R29898 Other symptoms and signs involving the musculoskeletal system: Secondary | ICD-10-CM

## 2023-04-29 NOTE — Therapy (Signed)
OUTPATIENT PHYSICAL THERAPY THORACOLUMBAR TREATMENT   Patient Name: Erik White MRN: [id number] DOB:12-23-1995, 27 y.o., male Today's Date: 04/29/2023  END OF SESSION:  PT End of Session - 04/29/23 1455     Visit Number 2    Number of Visits 24    Date for PT Re-Evaluation 07/15/23    Authorization Type [facility] $40 copay    PT Start Time 1449    PT Stop Time 1528    PT Time Calculation (min) 39 min    Activity Tolerance Patient tolerated treatment well    Behavior During Therapy [facility] for tasks assessed/performed             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   Well adult exam 03/16/2023   Past Surgical History:  Procedure Laterality Date   APPENDECTOMY     LAPAROSCOPIC APPENDECTOMY N/A 04/04/2020   Procedure: APPENDECTOMY LAPAROSCOPIC;  Surgeon: Darnell Level, MD;  Location: [facility];  Service: General;  Laterality: N/A;   SHOULDER ARTHROSCOPY WITH LABRAL REPAIR Left 05/28/2016   Procedure: LEFT SHOULDER ARTHROSCOPY WITH ANTERIOR AND POSTERIOR CAPSULOLABRAL RECONSTRUCTION;  Surgeon: Francena Hanly, MD;  Location: [facility];  Service: Orthopedics;  Laterality: Left;   WISDOM TOOTH EXTRACTION     Patient Active Problem List   Diagnosis Date Noted   Suspected sleep apnea 03/16/2023   Well adult exam 03/16/2023   Sciatica 03/07/2023   Chronic right shoulder pain 01/12/2022   Chest pain 01/03/2022   Eczema 12/30/2021   Ear bleeding, left 04/25/2020   Right lower quadrant abdominal pain 04/03/2020   Acute appendicitis with localized peritonitis 04/03/2020   Obesity (BMI 30-39.9) 04/03/2020   Essential hypertension 05/25/2019   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   SLAP lesion of shoulder 05/01/2016   Breast mass in male 04/20/2016   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   Patellar tendonitis of left knee 04/20/2016     PCP: Dr Everrett Coombe  REFERRING PROVIDER: Dr Everrett Coombe  REFERRING DIAG: sciatica of L side  Rationale for Evaluation and Treatment: Rehabilitation  THERAPY DIAG:  Sciatica of left side  Muscle weakness (generalized)  Other symptoms and signs involving the musculoskeletal system  ONSET DATE: 03/08/23  SUBJECTIVE SUBJECTIVE:                                                                                                                                                                                          STATEMENT:  HEP is going  well, overall in the big picture not feeling a lot better   PERTINENT HISTORY:  History of LBP ~ 5-6 years ago treatment with chiropractic care; history of upper back and shoulder pain; surgery for L shoulder labral tear with no significant improvement following surgery - he has some continued pain in bilat shoulders on an intermittent basis which is worse with upward motions. Appendectomy 2021; HTN  PAIN:  Are you having pain? Yes: NPRS scale: 6-6.5/10 Pain location: L posterior buttocks to posterior L distal thigh  Pain description: constant dull pain  Aggravating factors: getting out of bed; walking > 20 min; reaching Relieving factors: time; sometimes stretching   PRECAUTIONS: None  RED FLAGS: None   WEIGHT BEARING RESTRICTIONS: No  FALLS:  Has patient fallen in last 6 months? 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 No mass within either testicle.  PATIENT SURVEYS:  FOTO 51 Goal 71  SCREENING FOR RED FLAGS: Bowel or bladder incontinence: No Spinal tumors: No Cauda equina syndrome: No Compression fracture: No Abdominal aneurysm: No  COGNITION: Overall cognitive status: Within functional limits for tasks assessed     SENSATION: WFL  MUSCLE LENGTH: Hamstrings: Right 65 deg; Left 60 deg Tight hip flexors L/R  POSTURE: rounded shoulders and forward head; UE's in IR at sides; LE's in ER toe out  PALPATION: Muscular tightness bilat hip flexors; L QL, lumbar paraspinals, piriformis, gluts, hamstrings  LUMBAR ROM:   AROM eval  Flexion 60%  Extension 75%  Right lateral flexion 85%  Left lateral flexion 85% discomfort   Right rotation 60%  Left rotation 50% discomfort    (Blank rows = not tested)  LOWER EXTREMITY ROM:  WFL  Active  Right eval Left eval  Hip flexion    Hip extension    Hip abduction    Hip adduction    Hip internal rotation    Hip external rotation    Knee flexion    Knee extension    Ankle dorsiflexion    Ankle plantarflexion    Ankle inversion    Ankle eversion     (Blank rows = not tested)  LOWER EXTREMITY MMT:  functional hip strength not tested resistively   MMT Right eval Left eval  Hip flexion    Hip extension    Hip abduction    Hip adduction    Hip internal rotation    Hip external rotation    Knee flexion    Knee extension 5 5  Ankle dorsiflexion 5 5  Ankle plantarflexion 5 5  Ankle inversion    Ankle eversion     (Blank rows = not tested)  LUMBAR SPECIAL TESTS:  Straight leg raise test: Negative some increased muscular tightness L with testing L  and Slump test: Negative   GAIT: Distance walked: 40 ft Assistive device utilized: None Level  of assistance: Complete Independence Comments: slight limp  with WB phase L LE   [facility] Adult PT Treatment:                                                DATE:   04/29/23  TherEx  Nustep L4x6 minutes BLEs only Lumbar rotations 6x5 seconds B  SKTC 5x5 seconds B Bridges staggered stance x10 B (20 total) Prone opposite UE/LE extensions x10 B Quadruped lumbar arches x10  Wall lumbar extensions ROM to tolerance x10 TA set + march x12 B PPT + limited range SLR x10 B PPT + double bent leg raise- attempted but had increased pain Mini-crunches with lx spine in neutral position x10  Figure 4 stretch 1x30 seconds B seated      04/22/23 Therapeutic Exercise: Prone  Press up 2 sec x 5 Supine  4 part core 10 sec x 10  Hamstring stretch 30 sec x 2  Piriformis strength travell 30 sec x 2  Sciatic nerve glide 1-2 sec x 8  Standing  Myofacial ball release work L posterior hip using 4 inch plastic ball  Sitting  Use of noodle aling spine to improve sitting posture  Manual Therapy: Add trial of DN at next visit  Neuromuscular re-ed: Postural education  Modalities: Consider trial of TENS for pain management as needed  Self Care: To avoid sleeping in 3/4 prone position - discussed sleeping supine with pillows under knee    PATIENT EDUCATION:  Education details: POC; HEP Person educated: Patient Education method: Explanation, Demonstration, Tactile cues, Verbal cues, and Handouts Education comprehension: verbalized understanding, returned demonstration, verbal cues required, tactile cues required, and needs further education  HOME EXERCISE PROGRAM: Access Code: [email or web address] URL: [email or web address] Date: 04/22/2023 Prepared by: Corlis Leak  Exercises - Prone Press Up  - 2 x daily - 7 x weekly - 1 sets - 10 reps - 2-3 sec  hold - Standing Lumbar Extension  - 2 x daily - 7 x weekly - 1 sets - 2-3 reps - 2-3 sec  hold - Hooklying Hamstring Stretch with Strap  - 2 x daily - 7 x weekly - 1 sets - 3 reps - 30 sec  hold - Supine  Piriformis Stretch with Leg Straight  - 2 x daily - 7 x weekly - 1 sets - 3 reps - 30 sec  hold - Supine Sciatic Nerve Glide  - 2 x daily - 7 x weekly - 1 sets - 8-10 reps - 1-2 sec  hold - Supine Transversus Abdominis Bracing with Pelvic Floor Contraction  - 2 x daily - 7 x weekly - 1 sets - 10 reps - 10sec  hold  Patient Education - Healthy Posture: How to Hold and Lift a Baby - Spanish - Office Posture - Trigger Point Dry Needling - TENS Unit  ASSESSMENT:  CLINICAL IMPRESSION:  Travas arrives today doing OK, still having quite a bit of pain but seems like HEP is helping somewhat. We warmed up on the Nustep and then focused on progression of core strength, extension based lumbar program and pain reduction. Seemed to tolerate everything OK today, will progress as able and tolerated.   OBJECTIVE IMPAIRMENTS: Abnormal gait, decreased activity tolerance, decreased mobility, decreased ROM, decreased strength, increased fascial restrictions, impaired perceived functional ability, increased muscle spasms, impaired flexibility, improper body mechanics, postural dysfunction, and  pain.   ACTIVITY LIMITATIONS: sitting, standing, sleeping, transfers, bed mobility, and reach over head  PARTICIPATION LIMITATIONS: occupation and yard work  PERSONAL FACTORS: Behavior pattern, Fitness, Past/current experiences, Profession, Time since onset of injury/illness/exacerbation, and comorbidities: including history of LBP and generalized hypermobility with poor posture and alignment; poor core strength in upper and lower core are also affecting patient's functional outcome.   REHAB POTENTIAL: Good  CLINICAL DECISION MAKING: Stable/uncomplicated  EVALUATION COMPLEXITY: Low   GOALS: Goals reviewed with patient? Yes  SHORT TERM GOALS: Target date: 06/03/2023   Independent in initial HEP  Baseline: Goal status: INITIAL  2.  Patient reports sleeping in more ergonomically correct positions  Baseline:   Goal status: INITIAL   LONG TERM GOALS: Target date: 07/15/2023   Decrease pain by 75-100% allowing patient to return to all normal functional activities  Baseline:  Goal status: INITIAL  2.  Full pain free trunk and LE mobility/ROM Baseline:  Goal status: INITIAL  3.  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  Patient to report and demonstrate good posture and body mechanics for functional activities including sleeping  Baseline:  Goal status: INITIAL  5.  Independent in HEP including aquatic therapy as indicated  Baseline:  Goal status: INITIAL  6.  Improve functional limitation score to 71  Baseline:  Goal status: INITIAL  PLAN:  PT FREQUENCY: 2x/week  PT DURATION: 12 weeks  PLANNED INTERVENTIONS: Therapeutic exercises, Therapeutic activity, Neuromuscular re-education, Balance training, Gait training, Patient/Family education, Self Care, Joint mobilization, Aquatic Therapy, Dry Needling, Electrical stimulation, Spinal mobilization, Cryotherapy, Moist heat, Taping, Traction, Ultrasound, Ionotophoresis 4mg /ml Dexamethasone, Manual therapy, and Re-evaluation.  PLAN FOR NEXT SESSION: review and progress exercises; continue spine care and postural education; manual work, DN, modalities as indicated. Lumbar mobility, DN next visit?   Nedra Hai, PT, DPT 04/29/23 3:28 PM

## 2023-04-29 NOTE — Therapy (Signed)
OUTPATIENT PHYSICAL THERAPY THORACOLUMBAR TREATMENT   Patient Name: Erik White MRN: [id number] DOB:Feb 27, 1996, 27 y.o., male Today's Date: 04/30/2023  END OF SESSION:  PT End of Session - 04/30/23 1100     Visit Number 3    Number of Visits 24    Date for PT Re-Evaluation 07/15/23    Authorization Type [employer] $40 copay    PT Start Time 1017    PT Stop Time 1108    PT Time Calculation (min) 51 min    Activity Tolerance Patient tolerated treatment well    Behavior During Therapy [facility] for tasks assessed/performed              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   Well adult exam 03/16/2023   Past Surgical History:  Procedure Laterality Date   APPENDECTOMY     LAPAROSCOPIC APPENDECTOMY N/A 04/04/2020   Procedure: APPENDECTOMY LAPAROSCOPIC;  Surgeon: Darnell Level, MD;  Location: [facility];  Service: General;  Laterality: N/A;   SHOULDER ARTHROSCOPY WITH LABRAL REPAIR Left 05/28/2016   Procedure: LEFT SHOULDER ARTHROSCOPY WITH ANTERIOR AND POSTERIOR CAPSULOLABRAL RECONSTRUCTION;  Surgeon: Francena Hanly, MD;  Location: [facility];  Service: Orthopedics;  Laterality: Left;   WISDOM TOOTH EXTRACTION     Patient Active Problem List   Diagnosis Date Noted   Suspected sleep apnea 03/16/2023   Well adult exam 03/16/2023   Sciatica 03/07/2023   Chronic right shoulder pain 01/12/2022   Chest pain 01/03/2022   Eczema 12/30/2021   Ear bleeding, left 04/25/2020   Right lower quadrant abdominal pain 04/03/2020   Acute appendicitis with localized peritonitis 04/03/2020   Obesity (BMI 30-39.9) 04/03/2020   Essential hypertension 05/25/2019   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 Pain 4/10 with standing, constant 2/10 with sitting.  PERTINENT HISTORY:  History of LBP ~ 5-6 years ago treatment with chiropractic care; history of upper back and shoulder pain; surgery for L shoulder labral tear with no significant improvement following surgery - he has some continued pain in bilat shoulders on an intermittent basis which is worse with upward motions. Appendectomy 2021; HTN  PAIN:  Are you having pain? Yes: NPRS scale: 410 Pain location: L posterior buttocks to posterior L distal thigh  Pain description: constant dull pain  Aggravating factors: getting out of bed; walking > 20 min; reaching Relieving factors: time; sometimes stretching   PRECAUTIONS: None  RED FLAGS: None   WEIGHT BEARING RESTRICTIONS: No  FALLS:  Has patient fallen in last 6 months? 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 No mass within either testicle.  PATIENT SURVEYS:  FOTO 51 Goal 71  SCREENING FOR RED FLAGS: Bowel or bladder incontinence: No Spinal tumors: No Cauda equina syndrome: No Compression fracture: No Abdominal aneurysm: No  COGNITION: Overall cognitive status: Within functional limits for tasks assessed     SENSATION: WFL  MUSCLE LENGTH: Hamstrings: Right 65 deg; Left 60 deg Tight hip flexors L/R  POSTURE: rounded shoulders and forward head; UE's in IR at sides; LE's in ER toe out  PALPATION: Muscular tightness bilat hip flexors; L QL, lumbar paraspinals, piriformis, gluts, hamstrings  LUMBAR ROM:   AROM eval  Flexion 60%  Extension 75%  Right lateral flexion 85%  Left lateral flexion 85% discomfort   Right rotation 60%  Left rotation 50% discomfort    (Blank rows = not tested)  LOWER EXTREMITY ROM:  WFL  Active  Right eval Left eval  Hip flexion    Hip extension    Hip abduction    Hip adduction    Hip internal rotation    Hip external rotation    Knee flexion    Knee extension    Ankle dorsiflexion    Ankle plantarflexion    Ankle inversion    Ankle eversion     (Blank rows = not tested)  LOWER EXTREMITY MMT:  functional hip strength not tested resistively   MMT Right eval Left eval  Hip flexion    Hip extension    Hip abduction    Hip adduction    Hip internal rotation    Hip external rotation    Knee flexion    Knee extension 5 5  Ankle dorsiflexion 5 5  Ankle plantarflexion 5 5  Ankle inversion    Ankle eversion     (Blank rows = not tested)  LUMBAR SPECIAL TESTS:  Straight leg raise test: Negative some increased muscular tightness L with testing L  and Slump test: Negative   GAIT: Distance walked: 40 ft Assistive device utilized:  None Level of  assistance: Complete Independence Comments: slight limp with WB phase L LE   [facility] Adult PT Treatment:                                                DATE:   04/30/23  TherEx Nustep L5x6 minutes BLEs only Standing extensions x 3 painful and increased pain into thigh Lumbar extension with forearms resting on wall x 5 in pain free range Bridges staggered stance x10 B (20 total) Prone opposite UE/LE extensions x10 B Manual: Skilled palpation and monitoring of soft tissues during DN; STM to L gluteals post DN Trigger Point Dry-Needling  Treatment instructions: Expect mild to moderate muscle soreness. S/S of pneumothorax if dry needled over a lung field, and to seek immediate medical attention should they occur. Patient verbalized understanding of these instructions and education. Patient Consent Given: Yes Education handout provided: Yes Muscles treated: L gluteals and piriformis Electrical stimulation performed: No Parameters: N/A Treatment response/outcome: Twitch Response Elicited and Palpable Increase in Muscle Length TA set + march x12 B TA set with sequential march x 5 each side Moist hot pack x 8 min post treatment   04/29/23  TherEx  Nustep L4x6 minutes BLEs only Lumbar rotations 6x5 seconds B  SKTC 5x5 seconds B Bridges staggered stance x10 B (20 total) Prone opposite UE/LE extensions x10 B Quadruped lumbar arches x10  Wall lumbar extensions ROM to tolerance x10 TA set + march x12 B PPT + limited range SLR x10 B PPT + double bent leg raise- attempted but had increased pain Mini-crunches with lx spine in neutral position x10  Figure 4 stretch 1x30 seconds B seated     PATIENT EDUCATION:  Education details: POC; HEP Person educated: Patient Education method: Programmer, multimedia, Demonstration, Tactile cues, Verbal cues, and Handouts Education comprehension: verbalized understanding, returned demonstration, verbal cues required, tactile cues required, and needs further  education  HOME EXERCISE PROGRAM: Access Code: [email or web address] URL: [email or web address] Date: 04/22/2023 Prepared by: Corlis Leak  Exercises - Prone Press Up  - 2 x daily - 7 x weekly - 1 sets - 10 reps - 2-3 sec  hold - Standing Lumbar Extension  - 2 x daily - 7 x weekly - 1 sets - 2-3 reps - 2-3 sec  hold - Hooklying Hamstring Stretch with Strap  - 2 x daily - 7 x weekly - 1 sets - 3 reps - 30 sec  hold - Supine Piriformis Stretch with Leg Straight  - 2 x daily - 7 x weekly - 1 sets - 3 reps - 30 sec  hold - Supine Sciatic Nerve Glide  - 2 x daily - 7 x weekly - 1 sets - 8-10 reps - 1-2 sec  hold - Supine Transversus Abdominis Bracing with Pelvic Floor Contraction  - 2 x daily - 7 x weekly - 1 sets - 10 reps - 10sec  hold  Patient Education - Healthy Posture: How to Hold and Lift a Baby - Spanish - Office Posture - Trigger Point Dry Needling - TENS Unit  ASSESSMENT:  CLINICAL IMPRESSION: Xan reports having pain with one of his home exercises. He had been doing a SB rather than extensions, so we reviewed standing extensions as well as lumbar extensions with forearms at wall which were less painful for him. Initial trial of DN with excellent twitch responses  throughout gluteals. Patient reoorts decreased pain at end of session.   OBJECTIVE IMPAIRMENTS: Abnormal gait, decreased activity tolerance, decreased mobility, decreased ROM, decreased strength, increased fascial restrictions, impaired perceived functional ability, increased muscle spasms, impaired flexibility, improper body mechanics, postural dysfunction, and pain.   ACTIVITY LIMITATIONS: sitting, standing, sleeping, transfers, bed mobility, and reach over head  PARTICIPATION LIMITATIONS: occupation and yard work  PERSONAL FACTORS: Behavior pattern, Fitness, Past/current experiences, Profession, Time since onset of injury/illness/exacerbation, and comorbidities: including history of LBP and generalized  hypermobility with poor posture and alignment; poor core strength in upper and lower core are also affecting patient's functional outcome.   REHAB POTENTIAL: Good  CLINICAL DECISION MAKING: Stable/uncomplicated  EVALUATION COMPLEXITY: Low   GOALS: Goals reviewed with patient? Yes  SHORT TERM GOALS: Target date: 06/03/2023   Independent in initial HEP  Baseline: Goal status: INITIAL  2.  Patient reports sleeping in more ergonomically correct positions  Baseline:  Goal status: INITIAL   LONG TERM GOALS: Target date: 07/15/2023   Decrease pain by 75-100% allowing patient to return to all normal functional activities  Baseline:  Goal status: INITIAL  2.  Full pain free trunk and LE mobility/ROM Baseline:  Goal status: INITIAL  3.  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  Patient to report and demonstrate good posture and body mechanics for functional activities including sleeping  Baseline:  Goal status: INITIAL  5.  Independent in HEP including aquatic therapy as indicated  Baseline:  Goal status: INITIAL  6.  Improve functional limitation score to 71  Baseline:  Goal status: INITIAL  PLAN:  PT FREQUENCY: 2x/week  PT DURATION: 12 weeks  PLANNED INTERVENTIONS: Therapeutic exercises, Therapeutic activity, Neuromuscular re-education, Balance training, Gait training, Patient/Family education, Self Care, Joint mobilization, Aquatic Therapy, Dry Needling, Electrical stimulation, Spinal mobilization, Cryotherapy, Moist heat, Taping, Traction, Ultrasound, Ionotophoresis 4mg /ml Dexamethasone, Manual therapy, and Re-evaluation.  PLAN FOR NEXT SESSION: review and progress exercises; continue spine care and postural education; manual work, DN, modalities as indicated. Lumbar mobility, DN next visit?   Solon Palm, PT 04/30/23 11:43 AM

## 2023-04-30 ENCOUNTER — Ambulatory Visit: Payer: 59 | Admitting: Physical Therapy

## 2023-04-30 ENCOUNTER — Encounter: Payer: Self-pay | Admitting: Physical Therapy

## 2023-04-30 DIAGNOSIS — M25511 Pain in right shoulder: Secondary | ICD-10-CM | POA: Diagnosis not present

## 2023-04-30 DIAGNOSIS — M6281 Muscle weakness (generalized): Secondary | ICD-10-CM | POA: Diagnosis not present

## 2023-04-30 DIAGNOSIS — M5432 Sciatica, left side: Secondary | ICD-10-CM

## 2023-04-30 DIAGNOSIS — R29898 Other symptoms and signs involving the musculoskeletal system: Secondary | ICD-10-CM

## 2023-05-04 NOTE — Therapy (Addendum)
 OUTPATIENT PHYSICAL THERAPY THORACOLUMBAR TREATMENT AND DISCHARGE SUMMARY   Patient Name: Erik White MRN: [id number] DOB:03-18-1996, 27 y.o., male Today's Date: 05/05/2023  END OF SESSION:  PT End of Session - 05/05/23 0937     Visit Number 4    Number of Visits 24    Date for PT Re-Evaluation 07/15/23    Authorization Type [employer] $40 copay    PT Start Time [phone]    PT Stop Time 1015    PT Time Calculation (min) 38 min    Activity Tolerance Patient tolerated treatment well    Behavior During Therapy [facility] for tasks assessed/performed               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   Well adult exam 03/16/2023   Past Surgical History:  Procedure Laterality Date   APPENDECTOMY     LAPAROSCOPIC APPENDECTOMY N/A 04/04/2020   Procedure: APPENDECTOMY LAPAROSCOPIC;  Surgeon: Oralee Billow, MD;  Location: [facility];  Service: General;  Laterality: N/A;   SHOULDER ARTHROSCOPY WITH LABRAL REPAIR Left 05/28/2016   Procedure: LEFT SHOULDER ARTHROSCOPY WITH ANTERIOR AND POSTERIOR CAPSULOLABRAL RECONSTRUCTION;  Surgeon: Ellard Gunning, MD;  Location: [facility];  Service: Orthopedics;  Laterality: Left;   WISDOM TOOTH EXTRACTION     Patient Active Problem List   Diagnosis Date Noted   Suspected sleep apnea 03/16/2023   Well adult exam 03/16/2023   Sciatica 03/07/2023   Chronic right shoulder pain 01/12/2022   Chest pain 01/03/2022   Eczema 12/30/2021   Ear bleeding, left 04/25/2020   Right lower quadrant abdominal pain 04/03/2020   Acute appendicitis with localized peritonitis 04/03/2020   Obesity (BMI 30-39.9) 04/03/2020   Essential hypertension 05/25/2019   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 The needling helped.   PERTINENT HISTORY:  History of LBP ~ 5-6 years ago treatment with chiropractic care; history of upper back and shoulder pain; surgery for L shoulder labral tear with no significant improvement following surgery - he has some continued pain in bilat shoulders on an intermittent basis which is worse with upward motions. Appendectomy 2021; HTN  PAIN:  Are you having pain? Yes: NPRS scale: 0/10 Pain location: L posterior buttocks to posterior L distal thigh  Pain description: constant dull pain  Aggravating factors: getting out of bed; walking > 20 min; reaching Relieving factors: time; sometimes stretching   PRECAUTIONS: None  RED FLAGS: None   WEIGHT BEARING RESTRICTIONS: No  FALLS:  Has patient fallen in last 6 months? 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 No mass within either testicle.  PATIENT SURVEYS:  FOTO 51 Goal 71  SCREENING FOR RED FLAGS: Bowel or bladder incontinence: No Spinal tumors: No Cauda equina syndrome: No Compression fracture: No Abdominal aneurysm: No  COGNITION: Overall cognitive status: Within functional limits for tasks assessed     SENSATION: WFL  MUSCLE LENGTH: Hamstrings: Right 65 deg; Left 60 deg Tight hip flexors L/R  POSTURE: rounded shoulders and forward head; UE's in IR at sides; LE's in ER toe out  PALPATION: Muscular tightness bilat hip flexors; L QL, lumbar paraspinals, piriformis, gluts, hamstrings  LUMBAR ROM:   AROM eval  Flexion 60%  Extension 75%  Right lateral flexion 85%  Left lateral flexion 85% discomfort   Right rotation 60%  Left rotation 50% discomfort    (Blank rows = not tested)  LOWER EXTREMITY ROM:  WFL  Active  Right eval Left eval  Hip flexion    Hip extension    Hip abduction    Hip adduction    Hip internal rotation    Hip external rotation    Knee flexion    Knee extension    Ankle dorsiflexion    Ankle plantarflexion    Ankle inversion    Ankle eversion     (Blank rows = not tested)  LOWER EXTREMITY MMT:  functional hip strength not tested resistively   MMT Right eval Left eval  Hip flexion    Hip extension    Hip abduction    Hip adduction    Hip internal rotation    Hip external rotation    Knee flexion    Knee extension 5 5  Ankle dorsiflexion 5 5  Ankle plantarflexion 5 5  Ankle inversion    Ankle eversion     (Blank rows = not tested)  LUMBAR SPECIAL TESTS:  Straight leg raise test: Negative some increased muscular tightness L with testing L  and Slump test: Negative   GAIT: Distance walked: 40 ft Assistive device utilized: None Level of assistance: Complete Independence Comments:  slight limp with WB phase L LE    [facility] Adult PT Treatment:                                                DATE:  05/05/23  TherEx Elliptical L2 x 5 min Seated HS Stretch leg on table 2x30 sec B Standing dynamic HS stretch at TM - leg swings  Lumbar extension with forearms resting on wall x 10 in pain free range Seated hip flexor stretch 2x 30 sec B Bridges Blue TB 3 sec hold x10  SDLY clam Blue TB x 10 B Prone opposite UE/LE extensions x10 B TA set with sequential march x 5 each side   04/30/23  TherEx Nustep L5x6 minutes BLEs only Standing extensions x 3 painful and increased pain into thigh Lumbar extension with forearms resting on wall x 5 in pain free range Bridges staggered stance x10 B (20 total) Prone opposite UE/LE extensions x10 B Manual: Skilled palpation and monitoring of soft tissues during DN; STM to L gluteals post DN Trigger Point Dry-Needling  Treatment instructions: Expect mild to moderate muscle soreness. S/S of pneumothorax if dry needled over a lung field, and to seek immediate medical attention should they occur. Patient verbalized understanding of these instructions and education. Patient Consent Given: Yes Education handout provided: Yes Muscles treated: L gluteals and piriformis Electrical stimulation performed: No Parameters: N/A Treatment response/outcome: Twitch Response Elicited and Palpable Increase in Muscle Length TA set + march x12 B TA set with sequential march x 5 each side Moist hot pack x 8 min post treatment   04/29/23  TherEx  Nustep L4x6 minutes BLEs only Lumbar rotations 6x5 seconds B  SKTC 5x5 seconds B Bridges staggered stance x10 B (20 total) Prone opposite UE/LE extensions x10 B Quadruped lumbar arches x10  Wall lumbar extensions ROM to tolerance x10 TA set + march x12 B PPT + limited range SLR x10 B PPT + double bent leg raise- attempted but had increased pain Mini-crunches with lx spine in neutral position x10  Figure 4 stretch 1x30 seconds B seated      PATIENT EDUCATION:  Education details: POC; HEP Person educated: Patient Education method: Programmer, multimedia, Demonstration, Tactile cues, Verbal cues, and Handouts Education comprehension: verbalized understanding, returned demonstration, verbal cues required, tactile cues required, and needs further education  HOME EXERCISE PROGRAM: Access Code: [email or web address] URL: [email or web address] Date: 04/22/2023 Prepared by: Celyn Holt  Exercises - Prone Press Up  - 2 x daily - 7 x weekly - 1 sets - 10 reps - 2-3 sec  hold - Standing Lumbar Extension  - 2 x daily - 7 x weekly - 1 sets - 2-3 reps - 2-3 sec  hold - Hooklying Hamstring Stretch with Strap  - 2 x daily - 7 x weekly - 1 sets - 3 reps - 30 sec  hold - Supine Piriformis Stretch with Leg Straight  - 2 x daily - 7 x weekly - 1 sets - 3 reps - 30 sec  hold - Supine Sciatic Nerve Glide  - 2 x daily - 7 x weekly - 1 sets - 8-10 reps - 1-2 sec  hold - Supine Transversus Abdominis Bracing with Pelvic Floor Contraction  - 2 x daily - 7 x weekly - 1 sets - 10 reps - 10sec  hold  Patient Education - Healthy Posture:  How to Hold and Lift a Baby - Spanish - Office Posture - Trigger Point Dry Needling - TENS Unit  ASSESSMENT:  CLINICAL IMPRESSION:  Shafin reports no pain today after DN last session. We added more flexibility exercises today and introduced hip strengthening with no complaints of pain. He should be ready to progress to quadriped next visit and also progress ab work.    OBJECTIVE IMPAIRMENTS: Abnormal gait, decreased activity tolerance, decreased mobility, decreased ROM, decreased strength, increased fascial restrictions, impaired perceived functional ability, increased muscle spasms, impaired flexibility, improper body mechanics, postural dysfunction, and pain.   ACTIVITY LIMITATIONS: sitting, standing, sleeping, transfers, bed mobility, and reach over head  PARTICIPATION LIMITATIONS: occupation and yard  work  PERSONAL FACTORS: Behavior pattern, Fitness, Past/current experiences, Profession, Time since onset of injury/illness/exacerbation, and comorbidities: including history of LBP and generalized hypermobility with poor posture and alignment; poor core strength in upper and lower core are also affecting patient's functional outcome.   REHAB POTENTIAL: Good  CLINICAL DECISION MAKING: Stable/uncomplicated  EVALUATION COMPLEXITY: Low   GOALS: Goals reviewed with patient? Yes  SHORT TERM GOALS: Target date: 06/03/2023   Independent in initial HEP  Baseline: Goal status: INITIAL  2.  Patient reports sleeping in more ergonomically correct positions  Baseline:  Goal status: INITIAL   LONG TERM GOALS: Target date: 07/15/2023   Decrease pain by 75-100% allowing patient to return to all normal functional activities  Baseline:  Goal status: INITIAL  2.  Full pain free trunk and LE mobility/ROM Baseline:  Goal status: INITIAL  3.  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  Patient to report and demonstrate good posture and body mechanics for functional activities including sleeping  Baseline:  Goal status: INITIAL  5.  Independent in HEP including aquatic therapy as indicated  Baseline:  Goal status: INITIAL  6.  Improve functional limitation score to 71  Baseline:  Goal status: INITIAL  PLAN:  PT FREQUENCY: 2x/week  PT DURATION: 12 weeks  PLANNED INTERVENTIONS: Therapeutic exercises, Therapeutic activity, Neuromuscular re-education, Balance training, Gait training, Patient/Family education, Self Care, Joint mobilization, Aquatic Therapy, Dry Needling, Electrical stimulation, Spinal mobilization, Cryotherapy, Moist heat, Taping, Traction, Ultrasound, Ionotophoresis 4mg /ml Dexamethasone, Manual therapy, and Re-evaluation.  PLAN FOR NEXT SESSION: DN prn, may  benefit from Left HS DN, review and progress exercises; continue spine care and postural education; manual work, DN, modalities as indicated. Lumbar mobility,  Jinx Mourning, PT  05/05/23 10:13 AM   PHYSICAL THERAPY DISCHARGE SUMMARY  Visits from Start of Care: 4  Current functional level related to goals / functional outcomes: Unable to assess as pt did not return for f/u visits.    Remaining deficits: Unable to assess as pt did not return for f/u visits.    Education / Equipment: HEP   Patient agrees to discharge. Patient goals were not met. Patient is being discharged due to not returning since the last visit.  Jinx Mourning, PT 01/20/24 10:12 AM  [facility] [address] [place], [place] [address]  [phone] (office) [phone] (fax)

## 2023-05-05 ENCOUNTER — Encounter: Payer: Self-pay | Admitting: Physical Therapy

## 2023-05-05 ENCOUNTER — Ambulatory Visit: Payer: 59 | Admitting: Physical Therapy

## 2023-05-05 DIAGNOSIS — M5432 Sciatica, left side: Secondary | ICD-10-CM | POA: Diagnosis not present

## 2023-05-05 DIAGNOSIS — M25511 Pain in right shoulder: Secondary | ICD-10-CM | POA: Diagnosis not present

## 2023-05-05 DIAGNOSIS — R29898 Other symptoms and signs involving the musculoskeletal system: Secondary | ICD-10-CM | POA: Diagnosis not present

## 2023-05-05 DIAGNOSIS — M6281 Muscle weakness (generalized): Secondary | ICD-10-CM

## 2023-05-17 ENCOUNTER — Ambulatory Visit: Payer: 59 | Attending: Family Medicine | Admitting: Rehabilitative and Restorative Service Providers"

## 2023-05-17 DIAGNOSIS — M5432 Sciatica, left side: Secondary | ICD-10-CM | POA: Insufficient documentation

## 2023-05-17 DIAGNOSIS — M25511 Pain in right shoulder: Secondary | ICD-10-CM | POA: Insufficient documentation

## 2023-05-17 DIAGNOSIS — M6281 Muscle weakness (generalized): Secondary | ICD-10-CM | POA: Insufficient documentation

## 2023-05-17 DIAGNOSIS — R29898 Other symptoms and signs involving the musculoskeletal system: Secondary | ICD-10-CM | POA: Insufficient documentation

## 2023-05-17 NOTE — Therapy (Deleted)
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   Well adult exam 03/16/2023   Past Surgical History:  Procedure Laterality Date   APPENDECTOMY     LAPAROSCOPIC APPENDECTOMY N/A 04/04/2020   Procedure: APPENDECTOMY LAPAROSCOPIC;  Surgeon: Darnell Level, MD;  Location: [facility];  Service: General;  Laterality: N/A;   SHOULDER ARTHROSCOPY WITH LABRAL REPAIR Left 05/28/2016   Procedure: LEFT SHOULDER ARTHROSCOPY WITH ANTERIOR AND POSTERIOR CAPSULOLABRAL RECONSTRUCTION;  Surgeon: Francena Hanly, MD;  Location: [facility];  Service: Orthopedics;  Laterality: Left;   WISDOM TOOTH EXTRACTION     Patient Active Problem List   Diagnosis Date Noted   Suspected sleep apnea 03/16/2023   Well adult exam 03/16/2023   Sciatica 03/07/2023   Chronic right shoulder pain 01/12/2022   Chest pain 01/03/2022   Eczema 12/30/2021   Ear bleeding, left 04/25/2020   Right lower quadrant abdominal pain 04/03/2020   Acute appendicitis with localized peritonitis 04/03/2020   Obesity (BMI 30-39.9) 04/03/2020   Essential hypertension 05/25/2019   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  ONSET DATE: 03/08/23  SUBJECTIVE SUBJECTIVE:                                                                                                                                                                                           STATEMENT: Feeling better. The needling helped.   PERTINENT HISTORY:  History of LBP ~ 5-6 years ago treatment with chiropractic care; history of upper back and shoulder pain; surgery for L shoulder labral tear with no significant improvement following surgery - he has some continued pain in bilat shoulders on an intermittent basis which is worse with upward motions. Appendectomy 2021; HTN  PAIN:  Are you having pain? Yes: NPRS scale: 0/10 Pain location: L posterior buttocks to posterior L distal thigh  Pain description: constant dull pain  Aggravating factors: getting out of bed; walking > 20 min; reaching Relieving factors: time; sometimes stretching  PRECAUTIONS: None  RED FLAGS: None   WEIGHT BEARING RESTRICTIONS: No  FALLS:  Has patient fallen in last 6 months? 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 No mass within either testicle.  PATIENT SURVEYS:  FOTO 51 Goal 71  SCREENING FOR RED FLAGS: Bowel or bladder incontinence: No Spinal tumors: No Cauda equina syndrome: No Compression fracture: No Abdominal aneurysm: No  COGNITION: Overall cognitive status: Within functional limits for tasks assessed     SENSATION: WFL  MUSCLE LENGTH: Hamstrings: Right 65 deg; Left 60  deg Tight hip flexors L/R  POSTURE: rounded shoulders and forward head; UE's in IR at sides; LE's in ER toe out  PALPATION: Muscular tightness bilat hip flexors; L QL, lumbar paraspinals, piriformis, gluts, hamstrings  LUMBAR ROM:   AROM eval  Flexion 60%  Extension 75%  Right lateral flexion 85%  Left lateral flexion 85% discomfort   Right rotation 60%  Left rotation 50% discomfort    (Blank rows = not tested)  LOWER EXTREMITY ROM:  WFL  Active  Right eval Left eval  Hip flexion    Hip extension    Hip abduction    Hip adduction    Hip internal rotation    Hip external rotation    Knee flexion    Knee extension    Ankle dorsiflexion    Ankle plantarflexion    Ankle inversion    Ankle eversion     (Blank rows = not tested)  LOWER EXTREMITY MMT:  functional hip strength not tested resistively   MMT Right eval Left eval  Hip flexion    Hip extension    Hip abduction    Hip adduction    Hip internal rotation    Hip external rotation    Knee flexion    Knee extension 5 5  Ankle dorsiflexion 5 5  Ankle plantarflexion 5 5  Ankle inversion    Ankle eversion     (Blank rows = not tested)  LUMBAR SPECIAL TESTS:  Straight leg raise test: Negative some increased muscular tightness L with testing L  and Slump test: Negative   GAIT: Distance walked: 40 ft Assistive device utilized: None Level of assistance: Complete Independence Comments: slight limp with WB phase L LE   [facility] Adult PT Treatment:                                                DATE:  05/05/23  TherEx Elliptical L2 x 5 min Seated HS Stretch leg on table 2x30 sec B Standing dynamic HS stretch at TM - leg swings  Lumbar extension with forearms resting on wall x 10 in pain free range Seated hip flexor stretch 2x 30 sec  B Bridges Blue TB 3 sec hold x10  SDLY clam Blue TB x 10 B Prone opposite UE/LE extensions x10 B TA set with sequential march x 5 each side   04/30/23  TherEx Nustep  L5x6 minutes BLEs only Standing extensions x 3 painful and increased pain into thigh Lumbar extension with forearms resting on wall x 5 in pain free range Bridges staggered stance x10 B (20 total) Prone opposite UE/LE extensions x10 B Manual: Skilled palpation and monitoring of soft tissues during DN; STM to L gluteals post DN Trigger Point Dry-Needling  Treatment instructions: Expect mild to moderate muscle soreness. S/S of pneumothorax if dry needled over a lung field, and to seek immediate medical attention should they occur. Patient verbalized understanding of these instructions and education. Patient Consent Given: Yes Education handout provided: Yes Muscles treated: L gluteals and piriformis Electrical stimulation performed: No Parameters: N/A Treatment response/outcome: Twitch Response Elicited and Palpable Increase in Muscle Length TA set + march x12 B TA set with sequential march x 5 each side Moist hot pack x 8 min post treatment   04/29/23  TherEx  Nustep L4x6 minutes BLEs only Lumbar rotations 6x5 seconds B  SKTC 5x5 seconds B Bridges staggered stance x10 B (20 total) Prone opposite UE/LE extensions x10 B Quadruped lumbar arches x10  Wall lumbar extensions ROM to tolerance x10 TA set + march x12 B PPT + limited range SLR x10 B PPT + double bent leg raise- attempted but had increased pain Mini-crunches with lx spine in neutral position x10  Figure 4 stretch 1x30 seconds B seated     PATIENT EDUCATION:  Education details: POC; HEP Person educated: Patient Education method: Programmer, multimedia, Demonstration, Tactile cues, Verbal cues, and Handouts Education comprehension: verbalized understanding, returned demonstration, verbal cues required, tactile cues required, and needs further education  HOME EXERCISE PROGRAM: Access Code: [email or web address] URL: [email or web address] Date: 04/22/2023 Prepared by: Corlis Leak  Exercises - Prone Press Up  - 2 x daily - 7 x  weekly - 1 sets - 10 reps - 2-3 sec  hold - Standing Lumbar Extension  - 2 x daily - 7 x weekly - 1 sets - 2-3 reps - 2-3 sec  hold - Hooklying Hamstring Stretch with Strap  - 2 x daily - 7 x weekly - 1 sets - 3 reps - 30 sec  hold - Supine Piriformis Stretch with Leg Straight  - 2 x daily - 7 x weekly - 1 sets - 3 reps - 30 sec  hold - Supine Sciatic Nerve Glide  - 2 x daily - 7 x weekly - 1 sets - 8-10 reps - 1-2 sec  hold - Supine Transversus Abdominis Bracing with Pelvic Floor Contraction  - 2 x daily - 7 x weekly - 1 sets - 10 reps - 10sec  hold  Patient Education - Healthy Posture: How to Hold and Lift a Baby - Spanish - Office Posture - Trigger Point Dry Needling - TENS Unit  ASSESSMENT:  CLINICAL IMPRESSION:  Erik White reports no pain today after DN last session. We added more flexibility exercises today and introduced hip strengthening with no complaints of pain. He should be ready to progress to quadriped next visit and also progress ab work.    OBJECTIVE IMPAIRMENTS: Abnormal gait, decreased activity tolerance, decreased mobility, decreased ROM, decreased strength, increased fascial restrictions, impaired perceived functional ability, increased muscle spasms, impaired flexibility, improper body mechanics, postural dysfunction, and pain.   ACTIVITY LIMITATIONS: sitting, standing, sleeping, transfers, bed  mobility, and reach over head  PARTICIPATION LIMITATIONS: occupation and yard work  PERSONAL FACTORS: Behavior pattern, Fitness, Past/current experiences, Profession, Time since onset of injury/illness/exacerbation, and comorbidities: including history of LBP and generalized hypermobility with poor posture and alignment; poor core strength in upper and lower core are also affecting patient's functional outcome.   REHAB POTENTIAL: Good  CLINICAL DECISION MAKING: Stable/uncomplicated  EVALUATION COMPLEXITY: Low   GOALS: Goals reviewed with patient? Yes  SHORT TERM GOALS:  Target date: 06/03/2023   Independent in initial HEP  Baseline: Goal status: INITIAL  2.  Patient reports sleeping in more ergonomically correct positions  Baseline:  Goal status: INITIAL   LONG TERM GOALS: Target date: 07/15/2023   Decrease pain by 75-100% allowing patient to return to all normal functional activities  Baseline:  Goal status: INITIAL  2.  Full pain free trunk and LE mobility/ROM Baseline:  Goal status: INITIAL  3.  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  Patient to report and demonstrate good posture and body mechanics for functional activities including sleeping  Baseline:  Goal status: INITIAL  5.  Independent in HEP including aquatic therapy as indicated  Baseline:  Goal status: INITIAL  6.  Improve functional limitation score to 71  Baseline:  Goal status: INITIAL  PLAN:  PT FREQUENCY: 2x/week  PT DURATION: 12 weeks  PLANNED INTERVENTIONS: Therapeutic exercises, Therapeutic activity, Neuromuscular re-education, Balance training, Gait training, Patient/Family education, Self Care, Joint mobilization, Aquatic Therapy, Dry Needling, Electrical stimulation, Spinal mobilization, Cryotherapy, Moist heat, Taping, Traction, Ultrasound, Ionotophoresis 4mg /ml Dexamethasone, Manual therapy, and Re-evaluation.  PLAN FOR NEXT SESSION: DN prn, may benefit from Left HS DN, review and progress exercises; continue spine care and postural education; manual work, DN, modalities as indicated. Lumbar mobility,  Solon Palm, PT  05/17/23 2:40 PM

## 2023-05-18 ENCOUNTER — Encounter: Payer: Self-pay | Admitting: Family Medicine

## 2023-05-18 NOTE — Telephone Encounter (Signed)
Dr. Maple Hudson will interpret data.  We'll let him know once we get a report.   CM

## 2023-05-19 ENCOUNTER — Ambulatory Visit: Payer: 59 | Admitting: Physical Therapy

## 2023-05-21 DIAGNOSIS — R29818 Other symptoms and signs involving the nervous system: Secondary | ICD-10-CM

## 2023-05-21 DIAGNOSIS — R0683 Snoring: Secondary | ICD-10-CM

## 2023-05-21 NOTE — Procedures (Signed)
Patient Name: Erik White, Erik White Date: 04/28/2023 Gender: Male D.O.B: [birth year] Age (years): 26 Referring Provider: Elmarie Shiley MD Height (inches): 73 Interpreting Physician: Jetty Duhamel MD, ABSM Weight (lbs): 217 RPSGT: Alfonso Ellis BMI: 29 MRN: [id number] Neck Size: <br> <br> <br> CLINICAL INFORMATION Sleep Study Type: HST    Indication for sleep study:  fatigue, non refreshing sleep, snoring    Epworth Sleepiness Score: N/A  SLEEP STUDY TECHNIQUE A multi-channel overnight portable sleep study was performed. The channels recorded were: nasal airflow, thoracic respiratory movement, and oxygen saturation with a pulse oximetry. Snoring was also monitored.  MEDICATIONS Patient self administered medications include: N/A.  SLEEP ARCHITECTURE Patient was studied for 416.5 minutes. The sleep efficiency was 86.8 % and the patient was supine for 0%. The arousal index was 0.0 per hour.  RESPIRATORY PARAMETERS The overall AHI was 4.6 per hour, with a central apnea index of 0 per hour.  The oxygen nadir was 88% during sleep.    CARDIAC DATA Mean heart rate during sleep was 68.0 bpm.  IMPRESSIONS - No significant obstructive sleep apnea occurred during this study (AHI = 4.6/h). - Mild oxygen desaturation was noted during this study (Min O2 = 88%). - Patient snored 10.9% during the sleep.   DIAGNOSIS - Normal study   RECOMMENDATIONS - Avoid alcohol, sedatives and other CNS depressants that may worsen sleep apnea and disrupt normal sleep architecture. - Sleep hygiene should be reviewed to assess factors that may improve sleep quality. - Weight management and regular exercise should be initiated or continued.  [Electronically signed] 05/21/2023 09:15 AM  Cyril Mourning MD, ABSM Diplomate, American Board of Sleep Medicine NPI: [id number]

## 2023-12-16 ENCOUNTER — Ambulatory Visit: Admitting: Sports Medicine

## 2023-12-16 ENCOUNTER — Ambulatory Visit

## 2023-12-16 ENCOUNTER — Encounter: Payer: Self-pay | Admitting: Sports Medicine

## 2023-12-16 DIAGNOSIS — M7541 Impingement syndrome of right shoulder: Secondary | ICD-10-CM | POA: Diagnosis not present

## 2023-12-16 DIAGNOSIS — M4317 Spondylolisthesis, lumbosacral region: Secondary | ICD-10-CM | POA: Diagnosis not present

## 2023-12-16 DIAGNOSIS — M25511 Pain in right shoulder: Secondary | ICD-10-CM

## 2023-12-16 MED ORDER — HYDROCODONE-ACETAMINOPHEN 10-325 MG PO TABS: 1.0000 | ORAL_TABLET | Freq: Three times a day (TID) | ORAL | 0 refills | Status: DC | PRN

## 2023-12-16 NOTE — Assessment & Plan Note (Signed)
 Pleasant 28 year old male with a job requiring heavy lifting, he is having increasing pain axial low back with occasional radiation down to both knees but not past the knees. He was seen at [facility], x-rays were done, per his report it showed grade 2 spondylolisthesis with bilateral L5 pars defects, I do not have these to review. He was given some prednisone, muscle relaxers. Pain is worst when laying flat at night. His exam is overall normal, good strength, good sensation distally. I explained the anatomy and pathophysiology of spondylolisthesis, we will get updated x-rays with flexion and extension views, aggressive formal physical therapy in [facility], high-dose hydrocodone. Return to see me in 6 weeks.

## 2023-12-16 NOTE — Progress Notes (Signed)
    Procedures performed today:    None.  Independent interpretation of notes and tests performed by another provider:   None.  Brief History, Exam, Impression, and Recommendations:    Spondylolisthesis at L5-S1 level Pleasant 28 year old male with a job requiring heavy lifting, he is having increasing pain axial low back with occasional radiation down to both knees but not past the knees. 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 He was given some prednisone, muscle relaxers. Pain is worst when laying flat at night. His exam is overall normal, good strength, good sensation distally. I explained the anatomy and pathophysiology of spondylolisthesis, we will get updated x-rays with flexion and extension views, aggressive formal physical therapy in [facility], high-dose hydrocodone. Return to see me in 6 weeks.  Impingement syndrome, shoulder, right Increasing pain right shoulder worse with abduction. He has positive impingement sign's, negative O'Brien's test, he is status post labral repair on the left. I think he has more rotator cuff disease on the right side so we will add x-rays and formal PT, return to see me in 6 weeks, injection if not better.    ____________________________________________ Ihor Austin. Benjamin Stain, M.D., ABFM., CAQSM., AME. Primary Care and Sports Medicine [facility]  Adjunct Professor of Family Medicine  [employer]  Restaurant manager, fast food

## 2023-12-16 NOTE — Assessment & Plan Note (Signed)
 Increasing pain right shoulder worse with abduction. He has positive impingement sign's, negative O'Brien's test, he is status post labral repair on the left. I think he has more rotator cuff disease on the right side so we will add x-rays and formal PT, return to see me in 6 weeks, injection if not better.

## 2024-01-04 ENCOUNTER — Ambulatory Visit (HOSPITAL_COMMUNITY): Attending: Sports Medicine

## 2024-01-04 ENCOUNTER — Other Ambulatory Visit: Payer: Self-pay

## 2024-01-04 DIAGNOSIS — M25511 Pain in right shoulder: Secondary | ICD-10-CM | POA: Diagnosis present

## 2024-01-04 DIAGNOSIS — M5432 Sciatica, left side: Secondary | ICD-10-CM | POA: Diagnosis present

## 2024-01-04 DIAGNOSIS — M6281 Muscle weakness (generalized): Secondary | ICD-10-CM | POA: Diagnosis present

## 2024-01-04 DIAGNOSIS — M4317 Spondylolisthesis, lumbosacral region: Secondary | ICD-10-CM | POA: Diagnosis not present

## 2024-01-04 DIAGNOSIS — M4316 Spondylolisthesis, lumbar region: Secondary | ICD-10-CM | POA: Insufficient documentation

## 2024-01-04 DIAGNOSIS — M545 Low back pain, unspecified: Secondary | ICD-10-CM | POA: Diagnosis present

## 2024-01-04 NOTE — Patient Instructions (Signed)

## 2024-01-04 NOTE — Therapy (Signed)
 OUTPATIENT PHYSICAL THERAPY THORACOLUMBAR EVALUATION   Patient Name: Erik White MRN: [id number] DOB:06-26-1996, 28 y.o., male Today's Date: 01/04/2024  END OF SESSION:  PT End of Session - 01/04/24 0805     Visit Number 1    Number of Visits 8    Date for PT Re-Evaluation 02/01/24    Authorization Type Aetna [place] preferred    Authorization Time Period no auth    PT Start Time 0805    PT Stop Time 0845    PT Time Calculation (min) 40 min    Activity Tolerance Patient tolerated treatment well    Behavior During Therapy [facility] for tasks assessed/performed             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   Well adult exam 03/16/2023   Past Surgical History:  Procedure Laterality Date   APPENDECTOMY     LAPAROSCOPIC APPENDECTOMY N/A 04/04/2020   Procedure: APPENDECTOMY LAPAROSCOPIC;  Surgeon: Darnell Level, MD;  Location: [facility];  Service: General;  Laterality: N/A;   SHOULDER ARTHROSCOPY WITH LABRAL REPAIR Left 05/28/2016   Procedure: LEFT SHOULDER ARTHROSCOPY WITH ANTERIOR AND POSTERIOR CAPSULOLABRAL RECONSTRUCTION;  Surgeon: Francena Hanly, MD;  Location: [facility];  Service: Orthopedics;  Laterality: Left;   WISDOM TOOTH EXTRACTION     Patient Active Problem List   Diagnosis Date Noted   Spondylolisthesis at L5-S1 level 12/16/2023   Impingement syndrome, shoulder, right 12/16/2023   Suspected sleep apnea 03/16/2023   Well adult exam 03/16/2023   Sciatica 03/07/2023   Chronic right shoulder pain 01/12/2022   Chest pain 01/03/2022   Eczema 12/30/2021   Ear bleeding, left 04/25/2020   Right lower quadrant abdominal pain 04/03/2020   Acute appendicitis with localized peritonitis 04/03/2020   Obesity (BMI 30-39.9) 04/03/2020   Essential hypertension 05/25/2019   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   SLAP lesion of shoulder 05/01/2016    Breast mass in male 04/20/2016   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   Patellar tendonitis of left knee 04/20/2016    PCP: Elmarie Shiley, DO  REFERRING PROVIDER: Monica Becton, MD  REFERRING DIAG: [phone] (ICD-10-CM) - Spondylolisthesis at L5-S1 level  Rationale for Evaluation and Treatment: Rehabilitation  THERAPY DIAG:  Low back pain, unspecified back pain laterality, unspecified chronicity, unspecified whether sciatica present - Plan: PT plan of care cert/re-cert  Right shoulder pain, unspecified chronicity - Plan: PT plan of care cert/re-cert  Spondylolisthesis, lumbar region - Plan: PT plan of care cert/re-cert  ONSET DATE: 3 or 4 weeks ago, back  SUBJECTIVE:  SUBJECTIVE STATEMENT: Moving some boxes in my attic felt a twinge in my low back and have had pain both sides of low back; seems to move side to side.  Pain has shot down right leg to knee once.  Saw MD who did an x-ray and showed spondylolisthesis L5-SI.  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  His shoulder is feeling better and he wants to focus on back.  He is also having some radiating right side rib pain.  Sleeps on a heating pad at night.  He is limited with his activity; used to play basketball 2 x a week; has 2 dogs he wants to be active with; also had a football injury 8-9 months ago where he pulled his left hamstring  From MD: To [facility]   Evaluate and Treat. 1-2 times per week for 4-6 weeks. Decrease pain, increase strength, flexibility, function, and range of motion. Modalities may include, traction, ionto, phono, and stim. May include dry needling with or without stim.Marland Kitchen    PERTINENT HISTORY:  2017 left shoulder arthroscopy  PAIN:  Are you having pain? Yes: NPRS scale: now  3-4/10 at worst 7-8/10 at best 0 Pain location: today left side low back Pain description: tight, occassional zing Aggravating factors: prolonged standing Relieving factors: stretch, advil, hydrocodone, heat  PRECAUTIONS: None  RED FLAGS: None   WEIGHT BEARING RESTRICTIONS: No  FALLS:  Has patient fallen in last 6 months? No   OCCUPATION: driving a tractor trailer  PLOF: Independent  PATIENT GOALS: get back to normal; pain free  NEXT MD VISIT: 6 weeks  OBJECTIVE:  Note: Objective measures were completed at Evaluation unless otherwise noted.  DIAGNOSTIC FINDINGS:  CLINICAL DATA:  Back pain   EXAM: LUMBAR SPINE FLEX AND EXTEND ONLY - 2-3 VIEW   COMPARISON:  04/03/2020 CT reconstructions   FINDINGS: Bilateral L5 pars defects noted with associated L5 on S1 anterolisthesis measuring 9 mm, grade 1. Minor associated L5-S1 disc space narrowing. Transitional S1 segment anatomy noted. Preserved vertebral body heights. No acute compression fracture, wedge-shaped deformity or focal kyphosis. Slight increased listhesis of L5 on S1 with limited flexion 1.   IMPRESSION: 1. 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 2. No acute finding by plain radiography.  CLINICAL DATA:  Shoulder pain   EXAM: RIGHT SHOULDER - 2+ VIEW   COMPARISON:  None Available.   FINDINGS: There is no evidence of fracture or dislocation. There is no evidence of arthropathy or other focal bone abnormality. Soft tissues are unremarkable.   IMPRESSION: Negative.    PATIENT SURVEYS:  Modified Oswestry 12/50 24%   COGNITION: Overall cognitive status: Within functional limits for tasks assessed     SENSATION: No numbness and tingling  MUSCLE LENGTH: Noted left hamstring tightness versus right  POSTURE: rounded shoulders, forward head, and decreased lumbar lordosis  PALPATION: Tender T12 down to S1 bilateral paraspinals  LUMBAR ROM:   AROM eval  Flexion 5" past  distal kneecap  Extension 70% available  Right lateral flexion To knee joint line   Left lateral flexion To 2" past knee joint line  Right rotation   Left rotation    (Blank rows = not tested)  LOWER EXTREMITY ROM:     Active  Right eval Left eval  Hip flexion    Hip extension    Hip abduction    Hip adduction    Hip internal rotation    Hip external rotation  Knee flexion    Knee extension    Ankle dorsiflexion    Ankle plantarflexion    Ankle inversion    Ankle eversion     (Blank rows = not tested)  LOWER EXTREMITY MMT:    MMT Right eval Left eval  Hip flexion 4+ 5  Hip extension 4+* 4-*  Hip abduction 5 5  Hip adduction    Hip internal rotation    Hip external rotation    Knee flexion 5 5  Knee extension 5 5  Ankle dorsiflexion 5 5  Ankle plantarflexion    Ankle inversion    Ankle eversion     (Blank rows = not tested)   FUNCTIONAL TESTS:  5 times sit to stand: 16.02 no UE assist  GAIT: Distance walked: 50 ft Assistive device utilized: None Level of assistance: Modified independence Comments: no significant gait abnormalities noted  TREATMENT DATE: 01/04/24 physical therapy evaluation and HEP instruction                                                                                                                               PATIENT EDUCATION:  Education details: Patient educated on exam findings, POC, scope of PT, HEP, and dry needling instructions. Person educated: Patient Education method: Explanation, Demonstration, and Handouts Education comprehension: verbalized understanding, returned demonstration, verbal cues required, and tactile cues required  HOME EXERCISE PROGRAM: Access Code: [email or web address] URL: [email or web address] Date: 01/04/2024 Prepared by: [facility] - Rehab  Exercises - Supine Single Knee to Chest Stretch  - 2 x daily - 7 x weekly - 1 sets - 5 reps - 20 sec hold - Supine Hamstring Stretch  - 2 x daily - 7 x weekly  - 1 sets - 5 reps - 20 sec hold - Supine Transversus Abdominis Bracing - Hands on Stomach  - 1 x daily - 7 x weekly - 1 sets - 10 reps - 5 sec hold  ASSESSMENT:  CLINICAL IMPRESSION: Patient is a 28 y.o. male who was seen today for physical therapy evaluation and treatment for Lumbar spondylolisthesis and right shoulder impingement syndrome.  Today he wishes to focus on his back as his shoulder is feeling better.  Patient demonstrates muscle weakness, reduced ROM, and fascial restrictions which are likely contributing to symptoms of pain and are negatively impacting patient ability to perform ADLs and functional mobility tasks. Patient will benefit from skilled physical therapy services to address these deficits to reduce pain and improve level of function with ADLs and functional mobility tasks.      OBJECTIVE IMPAIRMENTS: decreased activity tolerance, decreased endurance, decreased mobility, decreased ROM, decreased strength, hypomobility, increased fascial restrictions, impaired perceived functional ability, impaired flexibility, and pain.   ACTIVITY LIMITATIONS: carrying, lifting, bending, sitting, standing, squatting, and locomotion level  PARTICIPATION LIMITATIONS: meal prep, cleaning, laundry, driving, and occupation  REHAB POTENTIAL: Good  CLINICAL DECISION MAKING: Evolving/moderate complexity  EVALUATION COMPLEXITY: Moderate   GOALS: Goals reviewed with patient?  No  SHORT TERM GOALS: Target date: 01/18/2024  patient will be independent with initial HEP  Baseline: Goal status: INITIAL  2.  Patient will report 50% improvement overall  Baseline:  Goal status: INITIAL   LONG TERM GOALS: Target date: 02/01/2024  Patient will be independent in self management strategies to improve quality of life and functional outcomes.  Baseline:  Goal status: INITIAL  2.  Patient will report 75% improvement overall  Baseline:  Goal status: INITIAL  3.  Patient will increase  all tested leg MMT's to 5/5 to allow navigation of steps without gait deviation or loss of balance  Baseline: see above Goal status: INITIAL  4.  Patient will increase his lumbar flexion to fingertips to ankles to improve ability to bend for work activities Baseline: see above Goal status: INITIAL  5.  Patient will improve his Modified Oswestry score by 5 points (7/50) to demonstrate improved perceived functional mobility Baseline: 12/50 Goal status: INITIAL   PLAN:  PT FREQUENCY: 2x/week  PT DURATION: 4 weeks  PLANNED INTERVENTIONS: 97164- PT Re-evaluation, 97110-Therapeutic exercises, 97530- Therapeutic activity, 97112- Neuromuscular re-education, 97535- Self Care, 40981- Manual therapy, [phone]- Gait training, [phone]- Orthotic Fit/training, [phone]- Canalith repositioning, [id number]- Aquatic Therapy, [phone]- Splinting, Patient/Family education, Balance training, Stair training, Taping, Dry Needling, Joint mobilization, Joint manipulation, Spinal manipulation, Spinal mobilization, Scar mobilization, and DME instructions. Marland Kitchen  PLAN FOR NEXT SESSION: Review HEP and goals; avoid lumbar extension; core strengthening and postural strengthening with spine in neutral; lumbar flexion,; dry needling as indicated.    9:02 AM, 01/04/24 Kalen Ratajczak Small Billi Bright MPT  physical therapy [place] [phone]

## 2024-01-06 ENCOUNTER — Ambulatory Visit (HOSPITAL_COMMUNITY)

## 2024-01-06 DIAGNOSIS — M4316 Spondylolisthesis, lumbar region: Secondary | ICD-10-CM

## 2024-01-06 DIAGNOSIS — M545 Low back pain, unspecified: Secondary | ICD-10-CM | POA: Diagnosis not present

## 2024-01-06 DIAGNOSIS — M25511 Pain in right shoulder: Secondary | ICD-10-CM

## 2024-01-06 NOTE — Therapy (Signed)
 OUTPATIENT PHYSICAL THERAPY THORACOLUMBAR EVALUATION   Patient Name: Erik White MRN: [id number] DOB:[birth year], 28 y.o., male Today's Date: 01/06/2024  END OF SESSION:  PT End of Session - 01/06/24 1145     Visit Number 2    Number of Visits 8    Date for PT Re-Evaluation 02/01/24    Authorization Type Aetna [place] preferred    Authorization Time Period no auth    PT Start Time 1145    PT Stop Time 1225    PT Time Calculation (min) 40 min    Activity Tolerance Patient tolerated treatment well    Behavior During Therapy [facility] for tasks assessed/performed             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   Well adult exam 03/16/2023   Past Surgical History:  Procedure Laterality Date   APPENDECTOMY     LAPAROSCOPIC APPENDECTOMY N/A 04/04/2020   Procedure: APPENDECTOMY LAPAROSCOPIC;  Surgeon: Darnell Level, MD;  Location: [facility];  Service: General;  Laterality: N/A;   SHOULDER ARTHROSCOPY WITH LABRAL REPAIR Left 05/28/2016   Procedure: LEFT SHOULDER ARTHROSCOPY WITH ANTERIOR AND POSTERIOR CAPSULOLABRAL RECONSTRUCTION;  Surgeon: Francena Hanly, MD;  Location: [facility];  Service: Orthopedics;  Laterality: Left;   WISDOM TOOTH EXTRACTION     Patient Active Problem List   Diagnosis Date Noted   Spondylolisthesis at L5-S1 level 12/16/2023   Impingement syndrome, shoulder, right 12/16/2023   Suspected sleep apnea 03/16/2023   Well adult exam 03/16/2023   Sciatica 03/07/2023   Chronic right shoulder pain 01/12/2022   Chest pain 01/03/2022   Eczema 12/30/2021   Ear bleeding, left 04/25/2020   Right lower quadrant abdominal pain 04/03/2020   Acute appendicitis with localized peritonitis 04/03/2020   Obesity (BMI 30-39.9) 04/03/2020   Essential hypertension 05/25/2019   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   SLAP lesion of shoulder 05/01/2016    Breast mass in male 04/20/2016   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   Patellar tendonitis of left knee 04/20/2016    PCP: Elmarie Shiley, DO  REFERRING PROVIDER: Monica Becton, MD  REFERRING DIAG: [phone] (ICD-10-CM) - Spondylolisthesis at L5-S1 level  Rationale for Evaluation and Treatment: Rehabilitation  THERAPY DIAG:  Low back pain, unspecified back pain laterality, unspecified chronicity, unspecified whether sciatica present  Right shoulder pain, unspecified chronicity  Spondylolisthesis, lumbar region  ONSET DATE: 3 or 4 weeks ago, back  SUBJECTIVE:  SUBJECTIVE STATEMENT: Feeling about the same; maybe a little bit better; exercises are good    Eval:Moving some boxes in my attic felt a twinge in my low back and have had pain both sides of low back; seems to move side to side.  Pain has shot down right leg to knee once.  Saw MD who did an x-ray and showed spondylolisthesis L5-SI.  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  His shoulder is feeling better and he wants to focus on back.  He is also having some radiating right side rib pain.  Sleeps on a heating pad at night.  He is limited with his activity; used to play basketball 2 x a week; has 2 dogs he wants to be active with; also had a football injury 8-9 months ago where he pulled his left hamstring  From MD: To [facility]   Evaluate and Treat. 1-2 times per week for 4-6 weeks. Decrease pain, increase strength, flexibility, function, and range of motion. Modalities may include, traction, ionto, phono, and stim. May include dry needling with or without stim.Marland Kitchen    PERTINENT HISTORY:  2017 left shoulder arthroscopy  PAIN:  Are you having pain? Yes: NPRS scale: now 3-4/10 at worst 7-8/10 at  best 0 Pain location: today left side low back Pain description: tight, occassional zing Aggravating factors: prolonged standing Relieving factors: stretch, advil, hydrocodone, heat  PRECAUTIONS: None  RED FLAGS: None   WEIGHT BEARING RESTRICTIONS: No  FALLS:  Has patient fallen in last 6 months? No   OCCUPATION: driving a tractor trailer  PLOF: Independent  PATIENT GOALS: get back to normal; pain free  NEXT MD VISIT: 6 weeks  OBJECTIVE:  Note: Objective measures were completed at Evaluation unless otherwise noted.  DIAGNOSTIC FINDINGS:  CLINICAL DATA:  Back pain   EXAM: LUMBAR SPINE FLEX AND EXTEND ONLY - 2-3 VIEW   COMPARISON:  04/03/2020 CT reconstructions   FINDINGS: Bilateral L5 pars defects noted with associated L5 on S1 anterolisthesis measuring 9 mm, grade 1. Minor associated L5-S1 disc space narrowing. Transitional S1 segment anatomy noted. Preserved vertebral body heights. No acute compression fracture, wedge-shaped deformity or focal kyphosis. Slight increased listhesis of L5 on S1 with limited flexion 1.   IMPRESSION: 1. 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 2. No acute finding by plain radiography.  CLINICAL DATA:  Shoulder pain   EXAM: RIGHT SHOULDER - 2+ VIEW   COMPARISON:  None Available.   FINDINGS: There is no evidence of fracture or dislocation. There is no evidence of arthropathy or other focal bone abnormality. Soft tissues are unremarkable.   IMPRESSION: Negative.    PATIENT SURVEYS:  Modified Oswestry 12/50 24%   COGNITION: Overall cognitive status: Within functional limits for tasks assessed     SENSATION: No numbness and tingling  MUSCLE LENGTH: Noted left hamstring tightness versus right  POSTURE: rounded shoulders, forward head, and decreased lumbar lordosis  PALPATION: Tender T12 down to S1 bilateral paraspinals  LUMBAR ROM:   AROM eval  Flexion 5" past distal kneecap  Extension  70% available  Right lateral flexion To knee joint line   Left lateral flexion To 2" past knee joint line  Right rotation   Left rotation    (Blank rows = not tested)  LOWER EXTREMITY ROM:     Active  Right eval Left eval  Hip flexion    Hip extension    Hip abduction  Hip adduction    Hip internal rotation    Hip external rotation    Knee flexion    Knee extension    Ankle dorsiflexion    Ankle plantarflexion    Ankle inversion    Ankle eversion     (Blank rows = not tested)  LOWER EXTREMITY MMT:    MMT Right eval Left eval  Hip flexion 4+ 5  Hip extension 4+* 4-*  Hip abduction 5 5  Hip adduction    Hip internal rotation    Hip external rotation    Knee flexion 5 5  Knee extension 5 5  Ankle dorsiflexion 5 5  Ankle plantarflexion    Ankle inversion    Ankle eversion     (Blank rows = not tested)   FUNCTIONAL TESTS:  5 times sit to stand: 16.02 no UE assist  GAIT: Distance walked: 50 ft Assistive device utilized: None Level of assistance: Modified independence Comments: no significant gait abnormalities noted  TREATMENT DATE:  01/06/24 Review HEP and goals Supine: SKTC 3 x 20" Sciatic nerve floss 3 x 5 reps LTR x 10 Abdominal bracing 5" hold x 10 Abdominal bracing with march 2 x 5 Abdominal bracing with knee extension (increased pain so d/c) Abdominal crunch (slide hands up knees) Abdominal bracing with alternation UE flexion x 5 each Abdominal bracing with blue med ball x 10 overhead shoulder flexion Trigger Point Dry Needling  Initial Treatment: Pt instructed on Dry Needling rational, procedures, and possible side effects. Pt instructed to expect mild to moderate muscle soreness later in the day and/or into the next day.  Pt instructed in methods to reduce muscle soreness. Pt instructed to continue prescribed HEP. Patient was educated on signs and symptoms of infection and other risk factors and advised to seek medical attention should they  occur.  Patient verbalized understanding of these instructions and education.   Patient Verbal Consent Given: Yes Education Handout Provided: Previously Provided Muscles Treated: right lumbar multifidus; left glute Electrical Stimulation Performed: No Treatment Response/Outcome: deep ache in left glute; twitch right multifidus         01/04/24 physical therapy evaluation and HEP instruction                                                                                                                               PATIENT EDUCATION:  Education details: Patient educated on exam findings, POC, scope of PT, HEP, and dry needling instructions. Person educated: Patient Education method: Explanation, Demonstration, and Handouts Education comprehension: verbalized understanding, returned demonstration, verbal cues required, and tactile cues required  HOME EXERCISE PROGRAM: Access Code: [email or web address] URL: [email or web address] Date: 01/04/2024 Prepared by: [facility] - Rehab  Exercises - Supine Single Knee to Chest Stretch  - 2 x daily - 7 x weekly - 1 sets - 5 reps - 20 sec hold - Supine Hamstring Stretch  - 2 x daily - 7 x weekly - 1 sets -  5 reps - 20 sec hold - Supine Transversus Abdominis Bracing - Hands on Stomach  - 1 x daily - 7 x weekly - 1 sets - 10 reps - 5 sec hold  ASSESSMENT:  CLINICAL IMPRESSION: Today's session started with a review of HEP and goals; patient verbalizes agreement with set rehab goals. Continued with lumbar mobility and progressed lumbar stability exercises.  Patient needs cues to breath as he holds abdominal bracing. Updated HEP.  Had some pain with trial of left leg extension so performed march instead with minimal issue.  Trial of dry needling today after manual palpation for trigger points right side lumbar multifidus and left glute.  Deep aching left glute and right side multifidus demonstrated twitch.  Patient will benefit from continued skilled  therapy services to address deficits and promote return to optimal function.      Eval: Patient is a 28 y.o. male who was seen today for physical therapy evaluation and treatment for Lumbar spondylolisthesis and right shoulder impingement syndrome.  Today he wishes to focus on his back as his shoulder is feeling better.  Patient demonstrates muscle weakness, reduced ROM, and fascial restrictions which are likely contributing to symptoms of pain and are negatively impacting patient ability to perform ADLs and functional mobility tasks. Patient will benefit from skilled physical therapy services to address these deficits to reduce pain and improve level of function with ADLs and functional mobility tasks.      OBJECTIVE IMPAIRMENTS: decreased activity tolerance, decreased endurance, decreased mobility, decreased ROM, decreased strength, hypomobility, increased fascial restrictions, impaired perceived functional ability, impaired flexibility, and pain.   ACTIVITY LIMITATIONS: carrying, lifting, bending, sitting, standing, squatting, and locomotion level  PARTICIPATION LIMITATIONS: meal prep, cleaning, laundry, driving, and occupation  REHAB POTENTIAL: Good  CLINICAL DECISION MAKING: Evolving/moderate complexity  EVALUATION COMPLEXITY: Moderate   GOALS: Goals reviewed with patient? No  SHORT TERM GOALS: Target date: 01/18/2024  patient will be independent with initial HEP  Baseline: Goal status: in progress  2.  Patient will report 50% improvement overall  Baseline:  Goal status: in progress   LONG TERM GOALS: Target date: 02/01/2024  Patient will be independent in self management strategies to improve quality of life and functional outcomes.  Baseline:  Goal status: in progress  2.  Patient will report 75% improvement overall  Baseline:  Goal status: in progress  3.  Patient will increase all tested leg MMT's to 5/5 to allow navigation of steps without gait deviation or  loss of balance  Baseline: see above Goal status: in progress   4.  Patient will increase his lumbar flexion to fingertips to ankles to improve ability to bend for work activities Baseline: see above Goal status: in progress  5.  Patient will improve his Modified Oswestry score by 5 points (7/50) to demonstrate improved perceived functional mobility Baseline: 12/50 Goal status: in progress   PLAN:  PT FREQUENCY: 2x/week  PT DURATION: 4 weeks  PLANNED INTERVENTIONS: 97164- PT Re-evaluation, 97110-Therapeutic exercises, 97530- Therapeutic activity, 97112- Neuromuscular re-education, 97535- Self Care, 16109- Manual therapy, [phone]- Gait training, [phone]- Orthotic Fit/training, [phone]- Canalith repositioning, [id number]- Aquatic Therapy, [phone]- Splinting, Patient/Family education, Balance training, Stair training, Taping, Dry Needling, Joint mobilization, Joint manipulation, Spinal manipulation, Spinal mobilization, Scar mobilization, and DME instructions. Marland Kitchen  PLAN FOR NEXT SESSION:  avoid lumbar extension; core strengthening and postural strengthening with spine in neutral; lumbar flexion,; dry needling as indicated.  Assess reaction to dry needling  12:36 PM, 01/06/24 Willmar Stockinger Small Abrina Petz  MPT [place] [facility] [place] [phone]

## 2024-01-12 ENCOUNTER — Ambulatory Visit (HOSPITAL_COMMUNITY)

## 2024-01-12 DIAGNOSIS — M5432 Sciatica, left side: Secondary | ICD-10-CM

## 2024-01-12 DIAGNOSIS — M545 Low back pain, unspecified: Secondary | ICD-10-CM | POA: Diagnosis not present

## 2024-01-12 DIAGNOSIS — M25511 Pain in right shoulder: Secondary | ICD-10-CM

## 2024-01-12 DIAGNOSIS — M6281 Muscle weakness (generalized): Secondary | ICD-10-CM

## 2024-01-12 DIAGNOSIS — M4316 Spondylolisthesis, lumbar region: Secondary | ICD-10-CM

## 2024-01-12 NOTE — Therapy (Signed)
 OUTPATIENT PHYSICAL THERAPY THORACOLUMBAR TREATMENT   Patient Name: Erik White MRN: [id number] DOB:[birth year], 28 y.o., male Today's Date: 01/12/2024  END OF SESSION:  PT End of Session - 01/12/24 0719     Visit Number 3    Number of Visits 8    Date for PT Re-Evaluation 02/01/24    Authorization Type [employer] [place] preferred    Authorization Time Period no auth    PT Start Time 0715    PT Stop Time 0750    PT Time Calculation (min) 35 min    Activity Tolerance Patient tolerated treatment well    Behavior During Therapy [facility] for tasks assessed/performed             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   Well adult exam 03/16/2023   Past Surgical History:  Procedure Laterality Date   APPENDECTOMY     LAPAROSCOPIC APPENDECTOMY N/A 04/04/2020   Procedure: APPENDECTOMY LAPAROSCOPIC;  Surgeon: Darnell Level, MD;  Location: [facility];  Service: General;  Laterality: N/A;   SHOULDER ARTHROSCOPY WITH LABRAL REPAIR Left 05/28/2016   Procedure: LEFT SHOULDER ARTHROSCOPY WITH ANTERIOR AND POSTERIOR CAPSULOLABRAL RECONSTRUCTION;  Surgeon: Francena Hanly, MD;  Location: [facility];  Service: Orthopedics;  Laterality: Left;   WISDOM TOOTH EXTRACTION     Patient Active Problem List   Diagnosis Date Noted   Spondylolisthesis at L5-S1 level 12/16/2023   Impingement syndrome, shoulder, right 12/16/2023   Suspected sleep apnea 03/16/2023   Well adult exam 03/16/2023   Sciatica 03/07/2023   Chronic right shoulder pain 01/12/2022   Chest pain 01/03/2022   Eczema 12/30/2021   Ear bleeding, left 04/25/2020   Right lower quadrant abdominal pain 04/03/2020   Acute appendicitis with localized peritonitis 04/03/2020   Obesity (BMI 30-39.9) 04/03/2020   Essential hypertension 05/25/2019   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   SLAP lesion of shoulder 05/01/2016    Breast mass in male 04/20/2016   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   Patellar tendonitis of left knee 04/20/2016    PCP: Elmarie Shiley, DO  REFERRING PROVIDER: Monica Becton, MD  REFERRING DIAG: [phone] (ICD-10-CM) - Spondylolisthesis at L5-S1 level  Rationale for Evaluation and Treatment: Rehabilitation  THERAPY DIAG:  Low back pain, unspecified back pain laterality, unspecified chronicity, unspecified whether sciatica present  Right shoulder pain, unspecified chronicity  Spondylolisthesis, lumbar region  Sciatica of left side  Muscle weakness (generalized)  ONSET DATE: 3 or 4 weeks ago, back  SUBJECTIVE:  SUBJECTIVE STATEMENT: Left glute area pain that radiates down left leg 6/10; right side pain mostly gone    Eval:Moving some boxes in my attic felt a twinge in my low back and have had pain both sides of low back; seems to move side to side.  Pain has shot down right leg to knee once.  Saw MD who did an x-ray and showed spondylolisthesis L5-SI.  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  His shoulder is feeling better and he wants to focus on back.  He is also having some radiating right side rib pain.  Sleeps on a heating pad at night.  He is limited with his activity; used to play basketball 2 x a week; has 2 dogs he wants to be active with; also had a football injury 8-9 months ago where he pulled his left hamstring  From MD: To [facility]   Evaluate and Treat. 1-2 times per week for 4-6 weeks. Decrease pain, increase strength, flexibility, function, and range of motion. Modalities may include, traction, ionto, phono, and stim. May include dry needling with or without stim.Marland Kitchen    PERTINENT HISTORY:  2017 left shoulder arthroscopy  PAIN:   Are you having pain? Yes: NPRS scale: now 3-4/10 at worst 7-8/10 at best 0 Pain location: today left side low back Pain description: tight, occassional zing Aggravating factors: prolonged standing Relieving factors: stretch, advil, hydrocodone, heat  PRECAUTIONS: None  RED FLAGS: None   WEIGHT BEARING RESTRICTIONS: No  FALLS:  Has patient fallen in last 6 months? No   OCCUPATION: driving a tractor trailer  PLOF: Independent  PATIENT GOALS: get back to normal; pain free  NEXT MD VISIT: 6 weeks  OBJECTIVE:  Note: Objective measures were completed at Evaluation unless otherwise noted.  DIAGNOSTIC FINDINGS:  CLINICAL DATA:  Back pain   EXAM: LUMBAR SPINE FLEX AND EXTEND ONLY - 2-3 VIEW   COMPARISON:  04/03/2020 CT reconstructions   FINDINGS: Bilateral L5 pars defects noted with associated L5 on S1 anterolisthesis measuring 9 mm, grade 1. Minor associated L5-S1 disc space narrowing. Transitional S1 segment anatomy noted. Preserved vertebral body heights. No acute compression fracture, wedge-shaped deformity or focal kyphosis. Slight increased listhesis of L5 on S1 with limited flexion 1.   IMPRESSION: 1. 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 2. No acute finding by plain radiography.  CLINICAL DATA:  Shoulder pain   EXAM: RIGHT SHOULDER - 2+ VIEW   COMPARISON:  None Available.   FINDINGS: There is no evidence of fracture or dislocation. There is no evidence of arthropathy or other focal bone abnormality. Soft tissues are unremarkable.   IMPRESSION: Negative.    PATIENT SURVEYS:  Modified Oswestry 12/50 24%   COGNITION: Overall cognitive status: Within functional limits for tasks assessed     SENSATION: No numbness and tingling  MUSCLE LENGTH: Noted left hamstring tightness versus right  POSTURE: rounded shoulders, forward head, and decreased lumbar lordosis  PALPATION: Tender T12 down to S1 bilateral  paraspinals  LUMBAR ROM:   AROM eval  Flexion 5" past distal kneecap  Extension 70% available  Right lateral flexion To knee joint line   Left lateral flexion To 2" past knee joint line  Right rotation   Left rotation    (Blank rows = not tested)  LOWER EXTREMITY ROM:     Active  Right eval Left eval  Hip flexion    Hip extension    Hip  abduction    Hip adduction    Hip internal rotation    Hip external rotation    Knee flexion    Knee extension    Ankle dorsiflexion    Ankle plantarflexion    Ankle inversion    Ankle eversion     (Blank rows = not tested)  LOWER EXTREMITY MMT:    MMT Right eval Left eval  Hip flexion 4+ 5  Hip extension 4+* 4-*  Hip abduction 5 5  Hip adduction    Hip internal rotation    Hip external rotation    Knee flexion 5 5  Knee extension 5 5  Ankle dorsiflexion 5 5  Ankle plantarflexion    Ankle inversion    Ankle eversion     (Blank rows = not tested)   FUNCTIONAL TESTS:  5 times sit to stand: 16.02 no UE assist  GAIT: Distance walked: 50 ft Assistive device utilized: None Level of assistance: Modified independence Comments: no significant gait abnormalities noted  TREATMENT DATE:  01/12/24 Prone lying with moist heat to left glute x 5' to decrease pain and improve soft tissue extensibility Trial of prone press ups x 10 with pain centralizing and decreasing Education on centralization Manual to locate trigger points in low back and left glute and hamstring Trigger Point Dry Needling  Subsequent Treatment: Instructions provided previously at initial dry needling treatment.   Patient Verbal Consent Given: Yes Education Handout Provided: Previously Provided Muscles Treated: left side lumbar multifidus, left glute and left lateral  hamstring Electrical Stimulation Performed: No Treatment Response/Outcome: twitches in left glute and hamstring; deep aching; decreased pain to 2/10     01/06/24 Review HEP and  goals Supine: SKTC 3 x 20" Sciatic nerve floss 3 x 5 reps LTR x 10 Abdominal bracing 5" hold x 10 Abdominal bracing with march 2 x 5 Abdominal bracing with knee extension (increased pain so d/c) Abdominal crunch (slide hands up knees) Abdominal bracing with alternation UE flexion x 5 each Abdominal bracing with blue med ball x 10 overhead shoulder flexion Trigger Point Dry Needling  Initial Treatment: Pt instructed on Dry Needling rational, procedures, and possible side effects. Pt instructed to expect mild to moderate muscle soreness later in the day and/or into the next day.  Pt instructed in methods to reduce muscle soreness. Pt instructed to continue prescribed HEP. Patient was educated on signs and symptoms of infection and other risk factors and advised to seek medical attention should they occur.  Patient verbalized understanding of these instructions and education.   Patient Verbal Consent Given: Yes Education Handout Provided: Previously Provided Muscles Treated: right lumbar multifidus; left glute Electrical Stimulation Performed: No Treatment Response/Outcome: deep ache in left glute; twitch right multifidus         01/04/24 physical therapy evaluation and HEP instruction  PATIENT EDUCATION:  Education details: Patient educated on exam findings, POC, scope of PT, HEP, and dry needling instructions. Person educated: Patient Education method: Explanation, Demonstration, and Handouts Education comprehension: verbalized understanding, returned demonstration, verbal cues required, and tactile cues required  HOME EXERCISE PROGRAM: Access Code: [email or web address] URL: [email or web address] Date: 01/04/2024 Prepared by: [facility]  Exercises - Supine Single Knee to Chest Stretch  - 2 x daily - 7 x weekly - 1 sets - 5 reps - 20 sec hold -  Supine Hamstring Stretch  - 2 x daily - 7 x weekly - 1 sets - 5 reps - 20 sec hold - Supine Transversus Abdominis Bracing - Hands on Stomach  - 1 x daily - 7 x weekly - 1 sets - 10 reps - 5 sec hold  ASSESSMENT:  CLINICAL IMPRESSION: Today's session started initially with some moist heat in prone lying due to patient report of increased pain.  Patient of note with radiating pain from low back to left hamstring.  Trial of prone press ups decreased pain and centralized.  Added to HEP and education on centralization; neutral spine.  Trigger point dry needling to left lumbar multifidus and glute and left hamstring with twitches and decreased pain to 2/10 at the end of treatment.    Patient will benefit from continued skilled therapy services to address deficits and promote return to optimal function.      Eval: Patient is a 28 y.o. male who was seen today for physical therapy evaluation and treatment for Lumbar spondylolisthesis and right shoulder impingement syndrome.  Today he wishes to focus on his back as his shoulder is feeling better.  Patient demonstrates muscle weakness, reduced ROM, and fascial restrictions which are likely contributing to symptoms of pain and are negatively impacting patient ability to perform ADLs and functional mobility tasks. Patient will benefit from skilled physical therapy services to address these deficits to reduce pain and improve level of function with ADLs and functional mobility tasks.      OBJECTIVE IMPAIRMENTS: decreased activity tolerance, decreased endurance, decreased mobility, decreased ROM, decreased strength, hypomobility, increased fascial restrictions, impaired perceived functional ability, impaired flexibility, and pain.   ACTIVITY LIMITATIONS: carrying, lifting, bending, sitting, standing, squatting, and locomotion level  PARTICIPATION LIMITATIONS: meal prep, cleaning, laundry, driving, and occupation  REHAB POTENTIAL: Good  CLINICAL DECISION  MAKING: Evolving/moderate complexity  EVALUATION COMPLEXITY: Moderate   GOALS: Goals reviewed with patient? No  SHORT TERM GOALS: Target date: 01/18/2024  patient will be independent with initial HEP  Baseline: Goal status: in progress  2.  Patient will report 50% improvement overall  Baseline:  Goal status: in progress   LONG TERM GOALS: Target date: 02/01/2024  Patient will be independent in self management strategies to improve quality of life and functional outcomes.  Baseline:  Goal status: in progress  2.  Patient will report 75% improvement overall  Baseline:  Goal status: in progress  3.  Patient will increase all tested leg MMT's to 5/5 to allow navigation of steps without gait deviation or loss of balance  Baseline: see above Goal status: in progress   4.  Patient will increase his lumbar flexion to fingertips to ankles to improve ability to bend for work activities Baseline: see above Goal status: in progress  5.  Patient will improve his Modified Oswestry score by 5 points (7/50) to demonstrate improved perceived functional mobility Baseline: 12/50 Goal status: in progress   PLAN:  PT FREQUENCY: 2x/week  PT DURATION: 4  weeks  PLANNED INTERVENTIONS: 97164- PT Re-evaluation, 97110-Therapeutic exercises, 97530- Therapeutic activity, 97112- Neuromuscular re-education, [phone]- Self Care, 60454- Manual therapy, [phone]- Gait training, [phone]- Orthotic Fit/training, [phone]- Canalith repositioning, [id number]- Aquatic Therapy, [phone]- Splinting, Patient/Family education, Balance training, Stair training, Taping, Dry Needling, Joint mobilization, Joint manipulation, Spinal manipulation, Spinal mobilization, Scar mobilization, and DME instructions. Marland Kitchen  PLAN FOR NEXT SESSION:  assess reaction to prone press ups; dry needling as indicated.  Assess reaction to dry needling  7:57 AM, 01/12/24 Lanny Lipkin Small Hortencia Martire MPT [facility] [phone]

## 2024-01-25 ENCOUNTER — Ambulatory Visit (HOSPITAL_COMMUNITY)

## 2024-01-25 DIAGNOSIS — M4316 Spondylolisthesis, lumbar region: Secondary | ICD-10-CM

## 2024-01-25 DIAGNOSIS — M545 Low back pain, unspecified: Secondary | ICD-10-CM

## 2024-01-25 DIAGNOSIS — M5432 Sciatica, left side: Secondary | ICD-10-CM

## 2024-01-25 NOTE — Therapy (Signed)
 OUTPATIENT PHYSICAL THERAPY THORACOLUMBAR TREATMENT   Patient Name: Erik White MRN: [id number] DOB:07-22-1996, 28 y.o., male Today's Date: 01/25/2024  END OF SESSION:  PT End of Session - 01/25/24 1148     Visit Number 4    Number of Visits 8    Date for PT Re-Evaluation 02/01/24    Authorization Type [employer] [place] preferred    Authorization Time Period no auth    PT Start Time 1147    PT Stop Time 1227    PT Time Calculation (min) 40 min    Activity Tolerance Patient tolerated treatment well    Behavior During Therapy [facility] for tasks assessed/performed             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   Well adult exam 03/16/2023   Past Surgical History:  Procedure Laterality Date   APPENDECTOMY     LAPAROSCOPIC APPENDECTOMY N/A 04/04/2020   Procedure: APPENDECTOMY LAPAROSCOPIC;  Surgeon: Oralee Billow, MD;  Location: [facility];  Service: General;  Laterality: N/A;   SHOULDER ARTHROSCOPY WITH LABRAL REPAIR Left 05/28/2016   Procedure: LEFT SHOULDER ARTHROSCOPY WITH ANTERIOR AND POSTERIOR CAPSULOLABRAL RECONSTRUCTION;  Surgeon: Ellard Gunning, MD;  Location: [facility];  Service: Orthopedics;  Laterality: Left;   WISDOM TOOTH EXTRACTION     Patient Active Problem List   Diagnosis Date Noted   Spondylolisthesis at L5-S1 level 12/16/2023   Impingement syndrome, shoulder, right 12/16/2023   Suspected sleep apnea 03/16/2023   Well adult exam 03/16/2023   Sciatica 03/07/2023   Chronic right shoulder pain 01/12/2022   Chest pain 01/03/2022   Eczema 12/30/2021   Ear bleeding, left 04/25/2020   Right lower quadrant abdominal pain 04/03/2020   Acute appendicitis with localized peritonitis 04/03/2020   Obesity (BMI 30-39.9) 04/03/2020   Essential hypertension 05/25/2019   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   SLAP lesion of shoulder 05/01/2016    Breast mass in male 04/20/2016   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   Patellar tendonitis of left knee 04/20/2016    PCP: Ubaldo Galt, DO  REFERRING PROVIDER: Gean Keels, MD  REFERRING DIAG: [id number] (ICD-10-CM) - Spondylolisthesis at L5-S1 level  Rationale for Evaluation and Treatment: Rehabilitation  THERAPY DIAG:  Low back pain, unspecified back pain laterality, unspecified chronicity, unspecified whether sciatica present  Spondylolisthesis, lumbar region  Sciatica of left side  ONSET DATE: 3 or 4 weeks ago, back  SUBJECTIVE:  SUBJECTIVE STATEMENT: Press ups definitely help but it comes back.  Minimal pain left side and hip area.      Eval:Moving some boxes in my attic felt a twinge in my low back and have had pain both sides of low back; seems to move side to side.  Pain has shot down right leg to knee once.  Saw MD who did an x-ray and showed spondylolisthesis L5-SI.  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  His shoulder is feeling better and he wants to focus on back.  He is also having some radiating right side rib pain.  Sleeps on a heating pad at night.  He is limited with his activity; used to play basketball 2 x a week; has 2 dogs he wants to be active with; also had a football injury 8-9 months ago where he pulled his left hamstring  From MD: To [facility]   Evaluate and Treat. 1-2 times per week for 4-6 weeks. Decrease pain, increase strength, flexibility, function, and range of motion. Modalities may include, traction, ionto, phono, and stim. May include dry needling with or without stim.Aaron Aas    PERTINENT HISTORY:  2017 left shoulder arthroscopy  PAIN:  Are you having pain? Yes: NPRS scale: now 3-4/10 at worst 7-8/10 at best  0 Pain location: today left side low back Pain description: tight, occassional zing Aggravating factors: prolonged standing Relieving factors: stretch, advil , hydrocodone , heat  PRECAUTIONS: None  RED FLAGS: None   WEIGHT BEARING RESTRICTIONS: No  FALLS:  Has patient fallen in last 6 months? No   OCCUPATION: driving a tractor trailer  PLOF: Independent  PATIENT GOALS: get back to normal; pain free  NEXT MD VISIT: 6 weeks  OBJECTIVE:  Note: Objective measures were completed at Evaluation unless otherwise noted.  DIAGNOSTIC FINDINGS:  CLINICAL DATA:  Back pain   EXAM: LUMBAR SPINE FLEX AND EXTEND ONLY - 2-3 VIEW   COMPARISON:  04/03/2020 CT reconstructions   FINDINGS: Bilateral L5 pars defects noted with associated L5 on S1 anterolisthesis measuring 9 mm, grade 1. Minor associated L5-S1 disc space narrowing. Transitional S1 segment anatomy noted. Preserved vertebral body heights. No acute compression fracture, wedge-shaped deformity or focal kyphosis. Slight increased listhesis of L5 on S1 with limited flexion 1.   IMPRESSION: 1. 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 2. No acute finding by plain radiography.  CLINICAL DATA:  Shoulder pain   EXAM: RIGHT SHOULDER - 2+ VIEW   COMPARISON:  None Available.   FINDINGS: There is no evidence of fracture or dislocation. There is no evidence of arthropathy or other focal bone abnormality. Soft tissues are unremarkable.   IMPRESSION: Negative.    PATIENT SURVEYS:  Modified Oswestry 12/50 24%   COGNITION: Overall cognitive status: Within functional limits for tasks assessed     SENSATION: No numbness and tingling  MUSCLE LENGTH: Noted left hamstring tightness versus right  POSTURE: rounded shoulders, forward head, and decreased lumbar lordosis  PALPATION: Tender T12 down to S1 bilateral paraspinals  LUMBAR ROM:   AROM eval  Flexion 5" past distal kneecap  Extension 70%  available  Right lateral flexion To knee joint line   Left lateral flexion To 2" past knee joint line  Right rotation   Left rotation    (Blank rows = not tested)  LOWER EXTREMITY ROM:     Active  Right eval Left eval  Hip flexion    Hip extension  Hip abduction    Hip adduction    Hip internal rotation    Hip external rotation    Knee flexion    Knee extension    Ankle dorsiflexion    Ankle plantarflexion    Ankle inversion    Ankle eversion     (Blank rows = not tested)  LOWER EXTREMITY MMT:    MMT Right eval Left eval Right 01/25/24 Left 01/25/24  Hip flexion 4+ 5    Hip extension 4+* 4-* 5 4*  Hip abduction 5 5    Hip adduction      Hip internal rotation      Hip external rotation      Knee flexion 5 5    Knee extension 5 5    Ankle dorsiflexion 5 5    Ankle plantarflexion      Ankle inversion      Ankle eversion       (Blank rows = not tested)   FUNCTIONAL TESTS:  5 times sit to stand: 16.02 no UE assist  GAIT: Distance walked: 50 ft Assistive device utilized: None Level of assistance: Modified independence Comments: no significant gait abnormalities noted  TREATMENT DATE:  01/25/24 Prone lying with moist heat x 5' to left glute and low back Still painful with left hip extension in prone > right although noted increased strength PPU x 10 PPU with right lateral hip shift x 10 Standing lateral lumbar mobilization x 10 Trigger Point Dry Needling  Subsequent Treatment: Instructions provided previously at initial dry needling treatment.   Patient Verbal Consent Given: Yes Education Handout Provided: Previously Provided Muscles Treated: left lumbar multifidus and left glute Electrical Stimulation Performed: No Treatment Response/Outcome: deep aching; improved left hip extension; less pain        01/12/24 Prone lying with moist heat to left glute x 5' to decrease pain and improve soft tissue extensibility Trial of prone press ups x 10 with  pain centralizing and decreasing Education on centralization Manual to locate trigger points in low back and left glute and hamstring Trigger Point Dry Needling  Subsequent Treatment: Instructions provided previously at initial dry needling treatment.   Patient Verbal Consent Given: Yes Education Handout Provided: Previously Provided Muscles Treated: left side lumbar multifidus, left glute and left lateral  hamstring Electrical Stimulation Performed: No Treatment Response/Outcome: twitches in left glute and hamstring; deep aching; decreased pain to 2/10     01/06/24 Review HEP and goals Supine: SKTC 3 x 20" Sciatic nerve floss 3 x 5 reps LTR x 10 Abdominal bracing 5" hold x 10 Abdominal bracing with march 2 x 5 Abdominal bracing with knee extension (increased pain so d/c) Abdominal crunch (slide hands up knees) Abdominal bracing with alternation UE flexion x 5 each Abdominal bracing with blue med ball x 10 overhead shoulder flexion Trigger Point Dry Needling  Initial Treatment: Pt instructed on Dry Needling rational, procedures, and possible side effects. Pt instructed to expect mild to moderate muscle soreness later in the day and/or into the next day.  Pt instructed in methods to reduce muscle soreness. Pt instructed to continue prescribed HEP. Patient was educated on signs and symptoms of infection and other risk factors and advised to seek medical attention should they occur.  Patient verbalized understanding of these instructions and education.   Patient Verbal Consent Given: Yes Education Handout Provided: Previously Provided Muscles Treated: right lumbar multifidus; left glute Electrical Stimulation Performed: No Treatment Response/Outcome: deep ache in left glute; twitch right multifidus  01/04/24 physical therapy evaluation and HEP instruction                                                                                                                                PATIENT EDUCATION:  Education details: Patient educated on exam findings, POC, scope of PT, HEP, and dry needling instructions. Person educated: Patient Education method: Explanation, Demonstration, and Handouts Education comprehension: verbalized understanding, returned demonstration, verbal cues required, and tactile cues required  HOME EXERCISE PROGRAM: Access Code: [email or web address] URL: [email or web address] Date: 01/04/2024 Prepared by: [facility]  Exercises - Supine Single Knee to Chest Stretch  - 2 x daily - 7 x weekly - 1 sets - 5 reps - 20 sec hold - Supine Hamstring Stretch  - 2 x daily - 7 x weekly - 1 sets - 5 reps - 20 sec hold - Supine Transversus Abdominis Bracing - Hands on Stomach  - 1 x daily - 7 x weekly - 1 sets - 10 reps - 5 sec hold  ASSESSMENT:  CLINICAL IMPRESSION: Today's session started initially with some moist heat in prone lying to decrease back pain.   Still painful with left hip extension in prone > right although noted increased strength.  Trial of prone press ups with lateral shift with noted decreased pain with left hip extension and increased strength; also added in standing; updated HEP.  Dry needling to left side low back and left glute with decreased pain after treatment.  .  Patient will benefit from continued skilled therapy services to address deficits and promote return to optimal function.      Eval: Patient is a 28 y.o. male who was seen today for physical therapy evaluation and treatment for Lumbar spondylolisthesis and right shoulder impingement syndrome.  Today he wishes to focus on his back as his shoulder is feeling better.  Patient demonstrates muscle weakness, reduced ROM, and fascial restrictions which are likely contributing to symptoms of pain and are negatively impacting patient ability to perform ADLs and functional mobility tasks. Patient will benefit from skilled physical therapy services to address these deficits  to reduce pain and improve level of function with ADLs and functional mobility tasks.      OBJECTIVE IMPAIRMENTS: decreased activity tolerance, decreased endurance, decreased mobility, decreased ROM, decreased strength, hypomobility, increased fascial restrictions, impaired perceived functional ability, impaired flexibility, and pain.   ACTIVITY LIMITATIONS: carrying, lifting, bending, sitting, standing, squatting, and locomotion level  PARTICIPATION LIMITATIONS: meal prep, cleaning, laundry, driving, and occupation  REHAB POTENTIAL: Good  CLINICAL DECISION MAKING: Evolving/moderate complexity  EVALUATION COMPLEXITY: Moderate   GOALS: Goals reviewed with patient? No  SHORT TERM GOALS: Target date: 01/18/2024  patient will be independent with initial HEP  Baseline: Goal status: in progress  2.  Patient will report 50% improvement overall  Baseline:  Goal status: in progress   LONG TERM GOALS: Target date: 02/01/2024  Patient will be independent in self management strategies  to improve quality of life and functional outcomes.  Baseline:  Goal status: in progress  2.  Patient will report 75% improvement overall  Baseline:  Goal status: in progress  3.  Patient will increase all tested leg MMT's to 5/5 to allow navigation of steps without gait deviation or loss of balance  Baseline: see above Goal status: in progress   4.  Patient will increase his lumbar flexion to fingertips to ankles to improve ability to bend for work activities Baseline: see above Goal status: in progress  5.  Patient will improve his Modified Oswestry score by 5 points (7/50) to demonstrate improved perceived functional mobility Baseline: 12/50 Goal status: in progress   PLAN:  PT FREQUENCY: 2x/week  PT DURATION: 4 weeks  PLANNED INTERVENTIONS: 97164- PT Re-evaluation, 97110-Therapeutic exercises, 97530- Therapeutic activity, 97112- Neuromuscular re-education, 97535- Self Care,  97140- Manual therapy, [phone]- Gait training, 91478- Orthotic Fit/training, 29562- Canalith repositioning, 13086- Aquatic Therapy, [phone]- Splinting, Patient/Family education, Balance training, Stair training, Taping, Dry Needling, Joint mobilization, Joint manipulation, Spinal manipulation, Spinal mobilization, Scar mobilization, and DME instructions. Aaron Aas  PLAN FOR NEXT SESSION:  assess reaction to prone press ups; dry needling as indicated.  Reassess next visit before MD appointment  12:29 PM, 01/25/24 Rotunda Worden Small Rayah Fines MPT [facility] [place] [phone]

## 2024-01-27 ENCOUNTER — Ambulatory Visit (INDEPENDENT_AMBULATORY_CARE_PROVIDER_SITE_OTHER): Admitting: Sports Medicine

## 2024-01-27 ENCOUNTER — Ambulatory Visit (HOSPITAL_COMMUNITY)

## 2024-01-27 DIAGNOSIS — M545 Low back pain, unspecified: Secondary | ICD-10-CM

## 2024-01-27 DIAGNOSIS — M4317 Spondylolisthesis, lumbosacral region: Secondary | ICD-10-CM

## 2024-01-27 DIAGNOSIS — M4316 Spondylolisthesis, lumbar region: Secondary | ICD-10-CM

## 2024-01-27 DIAGNOSIS — M5432 Sciatica, left side: Secondary | ICD-10-CM

## 2024-01-27 NOTE — Assessment & Plan Note (Addendum)
 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 Pain worse with spinal extension. We have put him through some physical therapy, he returns today not much better. At this point he has failed greater than 6 weeks of therapy, proceed with MRI for epidural planning, he would like me to go ahead and order the epidural when I see the results.  Update: MRI does show the spondylolisthesis, dominant finding is a very large disc herniation causing severe left L5 nerve root impingement, proceeding with left L5-S1 transforaminal epidural.  Return to see me 6 weeks after the injection.

## 2024-01-27 NOTE — Therapy (Signed)
 OUTPATIENT PHYSICAL THERAPY THORACOLUMBAR DISCHARGE PHYSICAL THERAPY DISCHARGE SUMMARY  Visits from Start of Care: 5  Current functional level related to goals / functional outcomes: See below   Remaining deficits: See below   Education / Equipment: HEP instruction   Patient agrees to discharge. Patient goals were partially met. Patient is being discharged due to being pleased with the current functional level.    Patient Name: Erik White MRN: [id number] DOB:[birth year], 28 y.o., male Today's Date: 01/27/2024  END OF SESSION:  PT End of Session - 01/27/24 0939     Visit Number 5    Number of Visits 8    Date for PT Re-Evaluation 02/01/24    Authorization Type [employer] preferred    Authorization Time Period no auth    PT Start Time 0935    PT Stop Time 1000    PT Time Calculation (min) 25 min    Activity Tolerance Patient tolerated treatment well    Behavior During Therapy [facility] for tasks assessed/performed             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   Well adult exam 03/16/2023   Past Surgical History:  Procedure Laterality Date   APPENDECTOMY     LAPAROSCOPIC APPENDECTOMY N/A 04/04/2020   Procedure: APPENDECTOMY LAPAROSCOPIC;  Surgeon: Oralee Billow, MD;  Location: [facility];  Service: General;  Laterality: N/A;   SHOULDER ARTHROSCOPY WITH LABRAL REPAIR Left 05/28/2016   Procedure: LEFT SHOULDER ARTHROSCOPY WITH ANTERIOR AND POSTERIOR CAPSULOLABRAL RECONSTRUCTION;  Surgeon: Ellard Gunning, MD;  Location: [facility] OR;  Service: Orthopedics;  Laterality: Left;   WISDOM TOOTH EXTRACTION     Patient Active Problem List   Diagnosis Date Noted   Spondylolisthesis at L5-S1 level 12/16/2023   Impingement syndrome, shoulder, right 12/16/2023   Suspected sleep apnea 03/16/2023   Well adult exam 03/16/2023   Sciatica  03/07/2023   Chronic right shoulder pain 01/12/2022   Chest pain 01/03/2022   Eczema 12/30/2021   Ear bleeding, left 04/25/2020   Right lower quadrant abdominal pain 04/03/2020   Acute appendicitis with localized peritonitis 04/03/2020   Obesity (BMI 30-39.9) 04/03/2020   Essential hypertension 05/25/2019   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   SLAP lesion of shoulder 05/01/2016   Breast mass in male 04/20/2016   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   Patellar tendonitis of left knee 04/20/2016    PCP: Ubaldo Galt, DO  REFERRING PROVIDER: Gean Keels, MD  REFERRING DIAG: [phone] (ICD-10-CM) - Spondylolisthesis at L5-S1 level  Rationale for Evaluation and Treatment: Rehabilitation  THERAPY DIAG:  Low back pain, unspecified back pain laterality, unspecified chronicity, unspecified whether sciatica present  Spondylolisthesis, lumbar region  Sciatica of left side  ONSET DATE: 3 or 4 weeks ago, back  SUBJECTIVE:  SUBJECTIVE STATEMENT: About "75%" better overall; press ups with a shift helped a good bit.  Sees MD after today's appointment    Eval:Moving some boxes in my attic felt a twinge in my low back and have had pain both sides of low back; seems to move side to side.  Pain has shot down right leg to knee once.  Saw MD who did an x-ray and showed spondylolisthesis L5-SI.  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  His shoulder is feeling better and he wants to focus on back.  He is also having some radiating right side rib pain.  Sleeps on a heating pad at night.  He is limited with his activity; used to play basketball 2 x a week; has 2 dogs he wants to be active with; also had a football injury 8-9 months ago where he pulled his left hamstring  From MD: To Cristine Done  Outpatient Rehab   Evaluate and Treat. 1-2 times per week for 4-6 weeks. Decrease pain, increase strength, flexibility, function, and range of motion. Modalities may include, traction, ionto, phono, and stim. May include dry needling with or without stim.Aaron Aas    PERTINENT HISTORY:  2017 left shoulder arthroscopy  PAIN:  Are you having pain? Yes: NPRS scale: now 3-4/10 at worst 7-8/10 at best 0 Pain location: today left side low back Pain description: tight, occassional zing Aggravating factors: prolonged standing Relieving factors: stretch, advil , hydrocodone , heat  PRECAUTIONS: None  RED FLAGS: None   WEIGHT BEARING RESTRICTIONS: No  FALLS:  Has patient fallen in last 6 months? No   OCCUPATION: driving a tractor trailer  PLOF: Independent  PATIENT GOALS: get back to normal; pain free  NEXT MD VISIT: 6 weeks  OBJECTIVE:  Note: Objective measures were completed at Evaluation unless otherwise noted.  DIAGNOSTIC FINDINGS:  CLINICAL DATA:  Back pain   EXAM: LUMBAR SPINE FLEX AND EXTEND ONLY - 2-3 VIEW   COMPARISON:  04/03/2020 CT reconstructions   FINDINGS: Bilateral L5 pars defects noted with associated L5 on S1 anterolisthesis measuring 9 mm, grade 1. Minor associated L5-S1 disc space narrowing. Transitional S1 segment anatomy noted. Preserved vertebral body heights. No acute compression fracture, wedge-shaped deformity or focal kyphosis. Slight increased listhesis of L5 on S1 with limited flexion 1.   IMPRESSION: 1. 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 2. No acute finding by plain radiography.  CLINICAL DATA:  Shoulder pain   EXAM: RIGHT SHOULDER - 2+ VIEW   COMPARISON:  None Available.   FINDINGS: There is no evidence of fracture or dislocation. There is no evidence of arthropathy or other focal bone abnormality. Soft tissues are unremarkable.   IMPRESSION: Negative.    PATIENT SURVEYS:  Modified Oswestry 12/50 24%    COGNITION: Overall cognitive status: Within functional limits for tasks assessed     SENSATION: No numbness and tingling  MUSCLE LENGTH: Noted left hamstring tightness versus right  POSTURE: rounded shoulders, forward head, and decreased lumbar lordosis  PALPATION: Tender T12 down to S1 bilateral paraspinals  LUMBAR ROM:   AROM eval 01/27/24  Flexion 5" past distal kneecap 1" above ankle  Extension 70% available   Right lateral flexion To knee joint line  2" past knee joint line  Left lateral flexion To 2" past knee joint line 4" past knee joint line  Right rotation    Left rotation     (Blank rows = not tested)  LOWER EXTREMITY ROM:  Active  Right eval Left eval  Hip flexion    Hip extension    Hip abduction    Hip adduction    Hip internal rotation    Hip external rotation    Knee flexion    Knee extension    Ankle dorsiflexion    Ankle plantarflexion    Ankle inversion    Ankle eversion     (Blank rows = not tested)  LOWER EXTREMITY MMT:    MMT Right eval Left eval Right 01/25/24 Left 01/25/24 Right 01/27/24 Left 01/27/24  Hip flexion 4+ 5   5 5   Hip extension 4+* 4-* 5 4* 5 4+*  Hip abduction 5 5      Hip adduction        Hip internal rotation        Hip external rotation        Knee flexion 5 5   5 5   Knee extension 5 5   5 5   Ankle dorsiflexion 5 5   5 5   Ankle plantarflexion        Ankle inversion        Ankle eversion         (Blank rows = not tested)   FUNCTIONAL TESTS:  5 times sit to stand: 16.02 no UE assist  GAIT: Distance walked: 50 ft Assistive device utilized: None Level of assistance: Modified independence Comments: no significant gait abnormalities noted  TREATMENT DATE:  01/27/24 Progress note 5 time sit to stand 9.32 sec no UE assist Modified Osw 10/50 20% AROM of lumbar spine and MMT's see above  01/25/24 Prone lying with moist heat x 5' to left glute and low back Still painful with left hip extension in prone  > right although noted increased strength PPU x 10 PPU with right lateral hip shift x 10 Standing lateral lumbar mobilization x 10 Trigger Point Dry Needling  Subsequent Treatment: Instructions provided previously at initial dry needling treatment.   Patient Verbal Consent Given: Yes Education Handout Provided: Previously Provided Muscles Treated: left lumbar multifidus and left glute Electrical Stimulation Performed: No Treatment Response/Outcome: deep aching; improved left hip extension; less pain        01/12/24 Prone lying with moist heat to left glute x 5' to decrease pain and improve soft tissue extensibility Trial of prone press ups x 10 with pain centralizing and decreasing Education on centralization Manual to locate trigger points in low back and left glute and hamstring Trigger Point Dry Needling  Subsequent Treatment: Instructions provided previously at initial dry needling treatment.   Patient Verbal Consent Given: Yes Education Handout Provided: Previously Provided Muscles Treated: left side lumbar multifidus, left glute and left lateral  hamstring Electrical Stimulation Performed: No Treatment Response/Outcome: twitches in left glute and hamstring; deep aching; decreased pain to 2/10     01/06/24 Review HEP and goals Supine: SKTC 3 x 20" Sciatic nerve floss 3 x 5 reps LTR x 10 Abdominal bracing 5" hold x 10 Abdominal bracing with march 2 x 5 Abdominal bracing with knee extension (increased pain so d/c) Abdominal crunch (slide hands up knees) Abdominal bracing with alternation UE flexion x 5 each Abdominal bracing with blue med ball x 10 overhead shoulder flexion Trigger Point Dry Needling  Initial Treatment: Pt instructed on Dry Needling rational, procedures, and possible side effects. Pt instructed to expect mild to moderate muscle soreness later in the day and/or into the next day.  Pt instructed in methods to reduce muscle  soreness. Pt instructed  to continue prescribed HEP. Patient was educated on signs and symptoms of infection and other risk factors and advised to seek medical attention should they occur.  Patient verbalized understanding of these instructions and education.   Patient Verbal Consent Given: Yes Education Handout Provided: Previously Provided Muscles Treated: right lumbar multifidus; left glute Electrical Stimulation Performed: No Treatment Response/Outcome: deep ache in left glute; twitch right multifidus         01/04/24 physical therapy evaluation and HEP instruction                                                                                                                               PATIENT EDUCATION:  Education details: Patient educated on exam findings, POC, scope of PT, HEP, and dry needling instructions. Person educated: Patient Education method: Explanation, Demonstration, and Handouts Education comprehension: verbalized understanding, returned demonstration, verbal cues required, and tactile cues required  HOME EXERCISE PROGRAM: Access Code: [email or web address] URL: [email or web address] Date: 01/04/2024 Prepared by: [facility] - Rehab  Exercises - Supine Single Knee to Chest Stretch  - 2 x daily - 7 x weekly - 1 sets - 5 reps - 20 sec hold - Supine Hamstring Stretch  - 2 x daily - 7 x weekly - 1 sets - 5 reps - 20 sec hold - Supine Transversus Abdominis Bracing - Hands on Stomach  - 1 x daily - 7 x weekly - 1 sets - 10 reps - 5 sec hold  ASSESSMENT:  CLINICAL IMPRESSION: Progress note today; good improvement and has met or partially met all set rehab goals.  Patient is agreeable to discharge at this time and will be seeing his MD after his appointment today.    Eval: Patient is a 28 y.o. male who was seen today for physical therapy evaluation and treatment for Lumbar spondylolisthesis and right shoulder impingement syndrome.  Today he wishes to focus on his back as his shoulder is feeling  better.  Patient demonstrates muscle weakness, reduced ROM, and fascial restrictions which are likely contributing to symptoms of pain and are negatively impacting patient ability to perform ADLs and functional mobility tasks. Patient will benefit from skilled physical therapy services to address these deficits to reduce pain and improve level of function with ADLs and functional mobility tasks.      OBJECTIVE IMPAIRMENTS: decreased activity tolerance, decreased endurance, decreased mobility, decreased ROM, decreased strength, hypomobility, increased fascial restrictions, impaired perceived functional ability, impaired flexibility, and pain.   ACTIVITY LIMITATIONS: carrying, lifting, bending, sitting, standing, squatting, and locomotion level  PARTICIPATION LIMITATIONS: meal prep, cleaning, laundry, driving, and occupation  REHAB POTENTIAL: Good  CLINICAL DECISION MAKING: Evolving/moderate complexity  EVALUATION COMPLEXITY: Moderate   GOALS: Goals reviewed with patient? No  SHORT TERM GOALS: Target date: 01/18/2024  patient will be independent with initial HEP  Baseline: Goal status: met  2.  Patient will report 50% improvement overall  Baseline:  Goal status: met  LONG TERM GOALS: Target date: 02/01/2024  Patient will be independent in self management strategies to improve quality of life and functional outcomes.  Baseline:  Goal status: met  2.  Patient will report 75% improvement overall  Baseline:  Goal status: met  3.  Patient will increase all tested leg MMT's to 5/5 to allow navigation of steps without gait deviation or loss of balance  Baseline: see above;met except for  Left hip ext 4+  Goal status: in progress   4.  Patient will increase his lumbar flexion to fingertips to ankles to improve ability to bend for work activities Baseline: see above Goal status: in progress  5.  Patient will improve his Modified Oswestry score by 5 points (7/50) to  demonstrate improved perceived functional mobility Baseline: 12/50; 10/50 on 01/27/24 Goal status: in progress   PLAN:  PT FREQUENCY: 2x/week  PT DURATION: 4 weeks  PLANNED INTERVENTIONS: 97164- PT Re-evaluation, 97110-Therapeutic exercises, 97530- Therapeutic activity, 97112- Neuromuscular re-education, 97535- Self Care, 95284- Manual therapy, [phone]- Gait training, [phone]- Orthotic Fit/training, [phone]- Canalith repositioning, [id number]- Aquatic Therapy, [phone]- Splinting, Patient/Family education, Balance training, Stair training, Taping, Dry Needling, Joint mobilization, Joint manipulation, Spinal manipulation, Spinal mobilization, Scar mobilization, and DME instructions. Aaron Aas  PLAN FOR NEXT SESSION:  discharge to HEP  10:03 AM, 01/27/24 Sigismund Cross Small Shery [place] MPT [facility] [place] [phone]

## 2024-01-27 NOTE — Progress Notes (Addendum)
    Procedures performed today:    None.  Independent interpretation of notes and tests performed by another provider:   None.  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 Pain worse with spinal extension. We have put him through some physical therapy, he returns today not much better. At this point he has failed greater than 6 weeks of therapy, proceed with MRI for epidural planning, he would like me to go ahead and order the epidural when I see the results.  Update: MRI does show the spondylolisthesis, dominant finding is a very large disc herniation causing severe left L5 nerve root impingement, proceeding with left L5-S1 transforaminal epidural.  Return to see me 6 weeks after the injection.    ____________________________________________ Joselyn Nicely. Sandy Crumb, M.D., ABFM., CAQSM., AME. Primary Care and Sports Medicine [facility]  Adjunct Professor of [facility] Medicine  [employer]  Restaurant manager, fast food

## 2024-01-30 ENCOUNTER — Ambulatory Visit

## 2024-01-30 DIAGNOSIS — M5416 Radiculopathy, lumbar region: Secondary | ICD-10-CM

## 2024-01-30 DIAGNOSIS — M4317 Spondylolisthesis, lumbosacral region: Secondary | ICD-10-CM

## 2024-02-01 ENCOUNTER — Encounter (HOSPITAL_COMMUNITY)

## 2024-02-08 ENCOUNTER — Encounter (HOSPITAL_COMMUNITY)

## 2024-02-11 ENCOUNTER — Encounter: Payer: Self-pay | Admitting: Sports Medicine

## 2024-02-11 NOTE — Addendum Note (Signed)
 Addended by: Gean Keels on: 02/11/2024 04:28 PM   Modules accepted: Orders

## 2024-02-15 ENCOUNTER — Encounter (HOSPITAL_COMMUNITY)

## 2024-02-21 NOTE — Discharge Instructions (Signed)

## 2024-02-22 ENCOUNTER — Ambulatory Visit
Admission: RE | Admit: 2024-02-22 | Discharge: 2024-02-22 | Disposition: A | Discharge status: Home / Self Care | Source: Ambulatory Visit | Attending: Sports Medicine | Admitting: Sports Medicine

## 2024-02-22 DIAGNOSIS — M4317 Spondylolisthesis, lumbosacral region: Secondary | ICD-10-CM

## 2024-02-22 MED ORDER — METHYLPREDNISOLONE ACETATE 40 MG/ML INJ SUSP (RADIOLOG
80.0000 mg | Freq: Once | INTRAMUSCULAR | Status: AC
  Administered 2024-02-22: 80 mg via EPIDURAL

## 2024-02-22 MED ORDER — IOPAMIDOL (ISOVUE-M 200) INJECTION 41%
1.0000 mL | Freq: Once | INTRAMUSCULAR | Status: AC
  Administered 2024-02-22: 1 mL via EPIDURAL

## 2024-06-06 ENCOUNTER — Encounter: Payer: Self-pay | Admitting: Sports Medicine

## 2024-07-04 ENCOUNTER — Ambulatory Visit: Admitting: Physician Assistant

## 2024-07-04 ENCOUNTER — Ambulatory Visit: Admitting: Family Medicine

## 2024-07-04 ENCOUNTER — Telehealth: Payer: Self-pay

## 2024-07-04 VITALS — BP 130/80 | HR 81 | Ht 73.0 in | Wt 242.0 lb

## 2024-07-04 DIAGNOSIS — M7702 Medial epicondylitis, left elbow: Secondary | ICD-10-CM | POA: Insufficient documentation

## 2024-07-04 DIAGNOSIS — M5442 Lumbago with sciatica, left side: Secondary | ICD-10-CM | POA: Diagnosis not present

## 2024-07-04 DIAGNOSIS — M4317 Spondylolisthesis, lumbosacral region: Secondary | ICD-10-CM

## 2024-07-04 DIAGNOSIS — G8929 Other chronic pain: Secondary | ICD-10-CM | POA: Insufficient documentation

## 2024-07-04 DIAGNOSIS — M7701 Medial epicondylitis, right elbow: Secondary | ICD-10-CM

## 2024-07-04 MED ORDER — PREDNISONE 50 MG PO TABS: ORAL_TABLET | ORAL | 0 refills | Status: AC

## 2024-07-04 MED ORDER — TRAMADOL HCL 50 MG PO TABS: 50.0000 mg | ORAL_TABLET | Freq: Four times a day (QID) | ORAL | 0 refills | Status: AC | PRN

## 2024-07-04 NOTE — Patient Instructions (Signed)
 Start prednisone  one tablet for 5 days.  Use tramadol  as needed for break through pain Exercises for epicondylitis and wearing elbow straps can help Voltaren  gel as needed can help as well Will refer to Dr. Joshua for consult Will order another epidural injection  Golfer's Elbow: What to Know  Golfer's elbow, or medial epicondylitis, is caused by swelling or damage to some of the tendons in your elbow. Tendons are the strong tissues that connect muscles to bones. Golfer's elbow affects the tendons that connect to the muscles that help you bend your palm toward your wrist. These tendons get less flexible as you get older. Golfer's elbow is more common among people who bend and twist their wrists a lot, such as golfers. What are the causes? Golfer's elbow is caused by: Flexing, turning, or twisting your wrist over and over. Often gripping things with your hands. Sudden injury. What increases the risk? You're more likely to get golfer's elbow if: You play certain sports, such as: Systems analyst. Baseball. Tennis. You have to use your hands a lot at your job. This includes if you use computers at work or if you're: A Music therapist. A butcher. A musician. What are the signs or symptoms? Symptoms include: Pain in: Your elbow. Your forearm. Your wrist. A weak grip in the hand. The pain may get worse when you bend your wrist downward. How is this diagnosed? Golfer's elbow is diagnosed based on your symptoms, your medical history, and an exam. During the exam, your health care provider may: Test your grip strength. Move your wrist to check for pain. You may also have an MRI to: See if you have golfer's elbow. Look for other issues. Check for tears in your muscles or tendons. How is this treated? You may need to: Stop doing things that make you bend or twist your elbow or wrist. Wait until the pain is all gone before doing those things again. Wear an elbow brace or wrist splint. Ice your inner  elbow, forearm, or wrist. Take NSAIDs, such as ibuprofen , or get steroid shots. These can help with pain and swelling. Do physical therapy as told by your provider. In rare cases, you may need surgery if you aren't getting better. Follow these instructions at home: If you have a brace or splint: Wear the brace or splint as told. Take it off only if your provider says you can. Check the skin around it every day. Tell your provider if you see problems. Loosen the brace or splint if your fingers tingle, are numb, or turn cold and blue. Keep the brace or splint clean. If the brace or splint isn't waterproof: Do not let it get wet. Cover it when you take a bath or shower. Use a cover that doesn't let any water in. Managing pain, stiffness, and swelling  Use ice or an ice pack as told. If you have a splint or brace that you can take off, remove it only as told. Place a towel between your skin and the ice. Leave the ice on for 20 minutes, 2-3 times a day. If your skin turns red, take off the ice right away to prevent skin damage. The risk of damage is higher if you can't feel pain, heat, or cold. Move your fingers often to avoid stiffness and swelling. Raise your arm above the level of your heart while you're sitting or lying down. Use pillows as needed. Activity Rest your arm as told. Exercise as told. Ask what things are safe for  you to do at home. Ask when you can go back to work or school. Lifestyle If you got golfer's elbow from playing sports, work with a Psychologist, educational. They can make sure that you: Use the right technique. Use the right equipment. If you got golfer's elbow from working, talk with your work about how to manage your condition. General instructions Take your medicines only as told. Do not smoke, vape, or use nicotine or tobacco. How is this prevented? Warm up and stretch before being active. Cool down and stretch after being active. Give your body time to rest. Stay fit.  Keep your body strong and flexible. Use sports equipment that fits you. If you play golf, slow your golf swing. This can help reduce shock in the arm when making contact with the ball. Contact a health care provider if: Your pain doesn't get better. Your pain gets worse. Your hand goes numb. Get help right away if: You have very bad pain. You can't move your wrist. This information is not intended to replace advice given to you by your health care provider. Make sure you discuss any questions you have with your health care provider. Document Revised: 05/17/2023 Document Reviewed: 05/17/2023 Elsevier Patient Education  2024 [employer].

## 2024-07-04 NOTE — Telephone Encounter (Signed)
 Per [facility] - needs diagnosis for Tramadol  rx. Spoke to the pharmacist directly and the following information was provided:  Spondylolisthesis at L5-S1 level [M43.17]; Bilateral medial epicondylitis of elbow joint [M77.01, M77.02]; Chronic left-sided low back pain with left-sided sciatica [M54.42, G89.29].  Task completed. No further actions is required.

## 2024-07-04 NOTE — Telephone Encounter (Signed)
 Copied from CRM [id number]. Topic: Clinical - Medication Question >> Jul 04, 2024 12:23 PM Shanda MATSU wrote: Reason for CRM: [facility], phone #, called in req diagnosis code for med, traMADol  (ULTRAM ) 50 MG tablet.

## 2024-07-04 NOTE — Progress Notes (Signed)
 Acute Office Visit  Subjective:     Patient ID: Erik White, male    DOB: 07-Feb-1996, 28 y.o.   MRN: [id number]   HPI Discussed the use of AI scribe software for clinical note transcription with the patient, who gave verbal consent to proceed.  History of Present Illness Erik White is a 28 year old male with spondylolisthesis who presents for follow-up after his back pain has started to worsen.   Lumbosacral radiculopathy and low back pain - Spondylolisthesis at L5-S1 with large disc herniation causing severe left L5 nerve root impingement, confirmed by MRI - Received left-sided transforaminal epidural injection with relief for approximately four months - Gradual return of low back pain over the last month with increasing severity - Pain described as excruciating, especially after resuming work following three-week training in [place]  - Pain radiates to the left hamstring, but not as severe as prior to injection - No new bowel or bladder dysfunction - Job duties include driving and unloading trucks, requiring lifting heavy objects, which exacerbates symptoms - Uses a back brace with minimal benefit - Performs physical therapy exercises daily - Muscle relaxer taken on Sunday night provided temporary relief - Uses heating pads for symptom management - Previous course of oral steroids was somewhat helpful  Elbow pain - Elbow pain, worse on the right but also present on the left - 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 Chronic L5 pars fractures with grade 1 spondylolisthesis of L5 on S1. Associated L5-S1 disc degeneration with a relatively large left foraminal disc extrusion. Severe left foraminal stenosis. Query Left L5 radiculitis. Additional endplate spurring, up to moderate posterior element hypertrophy at that level. No spinal or lateral recess stenosis. Mild right L5 foraminal stenosis.   2. Elsewhere largely negative lumbar MRI.  ROS See  HPI.     Objective:    BP 130/80   Pulse 81   Ht 6' 1 (1.854 m)   Wt 242 lb (109.8 kg)   SpO2 99%   BMI 31.93 kg/m  BP Readings from Last 3 Encounters:  07/04/24 130/80  02/22/24 (!) 154/87  03/16/23 122/83   Wt Readings from Last 3 Encounters:  07/04/24 242 lb (109.8 kg)  03/16/23 271 lb 6.4 oz (123.1 kg)  03/03/23 276 lb (125.2 kg)      Physical Exam Constitutional:      Appearance: Normal appearance.  HENT:     Head: Normocephalic.  Cardiovascular:     Rate and Rhythm: Normal rate and regular rhythm.  Pulmonary:     Effort: Pulmonary effort is normal.     Breath sounds: Normal breath sounds.  Musculoskeletal:     Comments: Tenderness to palpation over both medial epicondyles to palpation. No warmth, redness, swelling.   Neurological:     General: No focal deficit present.     Mental Status: He is alert and oriented to person, place, and time.  Psychiatric:        Mood and Affect: Mood normal.          Assessment & Plan:  .Erik White was seen today for back pain.  Diagnoses and all orders for this visit:  Spondylolisthesis at L5-S1 level -     predniSONE  (DELTASONE ) 50 MG tablet; Take one tablet for 5 days. -     traMADol  (ULTRAM ) 50 MG tablet; Take 1 tablet (50 mg total) by mouth every 6 (six) hours as needed for up to 5 days. -  Ambulatory referral to Neurosurgery -     DG Epidural/Nerve Root; Future  Bilateral medial epicondylitis of elbow joint -     predniSONE  (DELTASONE ) 50 MG tablet; Take one tablet for 5 days. -     traMADol  (ULTRAM ) 50 MG tablet; Take 1 tablet (50 mg total) by mouth every 6 (six) hours as needed for up to 5 days.  Chronic left-sided low back pain with left-sided sciatica -     predniSONE  (DELTASONE ) 50 MG tablet; Take one tablet for 5 days. -     traMADol  (ULTRAM ) 50 MG tablet; Take 1 tablet (50 mg total) by mouth every 6 (six) hours as needed for up to 5 days. -     Ambulatory referral to Neurosurgery -     DG  Epidural/Nerve Root; Future    Assessment & Plan Lumbosacral spondylolisthesis with left L5-S1 disc herniation and left L5 nerve root impingement with recurrent lumbar radiculopathy Chronic low back pain due to L5-S1 spondylolisthesis and disc herniation causing left L5 nerve root impingement. Previous epidural injection provided relief for 4-5 months. Pain has returned, exacerbated by heavy lifting, radiating to the left hamstring without bowel or bladder dysfunction. Current management includes physical therapy and a back brace, though the brace is not helpful. - Prescribe a burst of oral prednisone . - Order L5-S1 left-sided transforaminal epidural injection. - Refer to neurosurgeon Dr. Joshua for surgical evaluation. - Provide tramadol  for pain management, advising against use while driving. SABRASABRAPDMP reviewed during this encounter. No concerns - Advise on core strengthening exercises and proper lifting techniques.  Bilateral medial and lateral epicondylitis (elbow tendinopathy) Chronic bilateral elbow tendinopathy, likely exacerbated by repetitive lifting and use of hand trucks at work. Symptoms are present bilaterally. - Recommend wearing elbow straps on both sides, especially during work. - Prescribe prednisone  for elbow inflammation. - voltaren  gel sample given to use as needed over painful area - icing could be helpful as well    Rana Hochstein, PA-C

## 2024-07-17 ENCOUNTER — Encounter: Payer: Self-pay | Admitting: Physician Assistant

## 2024-07-18 NOTE — Discharge Instructions (Signed)

## 2024-07-19 ENCOUNTER — Ambulatory Visit
Admission: RE | Admit: 2024-07-19 | Discharge: 2024-07-19 | Disposition: A | Discharge status: Home / Self Care | Source: Ambulatory Visit | Attending: Physician Assistant | Admitting: Physician Assistant

## 2024-07-19 DIAGNOSIS — M4317 Spondylolisthesis, lumbosacral region: Secondary | ICD-10-CM

## 2024-07-19 DIAGNOSIS — G8929 Other chronic pain: Secondary | ICD-10-CM

## 2024-07-19 MED ORDER — METHYLPREDNISOLONE ACETATE 40 MG/ML INJ SUSP (RADIOLOG
80.0000 mg | Freq: Once | INTRAMUSCULAR | Status: AC
  Administered 2024-07-19: 80 mg via EPIDURAL

## 2024-07-19 MED ORDER — IOPAMIDOL (ISOVUE-M 200) INJECTION 41%
1.0000 mL | Freq: Once | INTRAMUSCULAR | Status: AC
  Administered 2024-07-19: 1 mL via EPIDURAL

## 2024-09-16 ENCOUNTER — Inpatient Hospital Stay: Admission: RE | Admit: 2024-09-16 | Discharge: 2024-09-16 | Payer: Self-pay | Attending: Family Medicine

## 2024-09-16 VITALS — BP 117/77 | HR 79 | Temp 98.3°F | Resp 16

## 2024-09-16 DIAGNOSIS — J208 Acute bronchitis due to other specified organisms: Secondary | ICD-10-CM | POA: Diagnosis not present

## 2024-09-16 MED ORDER — ALBUTEROL SULFATE HFA 108 (90 BASE) MCG/ACT IN AERS: 2.0000 | INHALATION_SPRAY | RESPIRATORY_TRACT | 0 refills | Status: AC | PRN

## 2024-09-16 MED ORDER — PREDNISONE 20 MG PO TABS: 40.0000 mg | ORAL_TABLET | Freq: Every day | ORAL | 0 refills | Status: AC

## 2024-09-16 MED ORDER — PROMETHAZINE-DM 6.25-15 MG/5ML PO SYRP: 5.0000 mL | ORAL_SOLUTION | Freq: Four times a day (QID) | ORAL | 0 refills | Status: AC | PRN

## 2024-09-16 NOTE — Discharge Instructions (Signed)
 You may continue the over-the-counter cold and congestion medications until symptoms improve.  Take the prescribed medications and follow-up for worsening symptoms

## 2024-09-16 NOTE — ED Triage Notes (Signed)
 Pt states congestion,headache,pressure behind his eyes for the past 3 weeks.  States he has been taking dayquil,nyquil,decongestant and afrin at home.  States he had some left over amoxicillin  at home and has been taking it for the past 5 days with no improvement in his symptoms.

## 2024-09-16 NOTE — ED Provider Notes (Signed)
 [facility]    CSN: [id number] Arrival date & time: 09/16/24  1152      History   Chief Complaint Chief Complaint  Patient presents with   Nasal Congestion    Entered by patient    HPI MYCAL CONDE is a 28 y.o. male.   Patient presenting today with 3-week history of ongoing congestion, headache, sinus pressure, wheezing, chest tightness, cough.  Denies chest pain, shortness of breath, abdominal pain, vomiting, diarrhea.  Trying DayQuil, NyQuil, decongestants and Afrin and started amoxicillin  that he had leftover from something else 5 days ago all with no relief.  No known history of chronic pulmonary disease.    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   Well adult exam 03/16/2023    Patient Active Problem List   Diagnosis Date Noted   Bilateral medial epicondylitis of elbow joint 07/04/2024   Chronic left-sided low back pain with left-sided sciatica 07/04/2024   Spondylolisthesis at L5-S1 level 12/16/2023   Impingement syndrome, shoulder, right 12/16/2023   Suspected sleep apnea 03/16/2023   Well adult exam 03/16/2023   Sciatica 03/07/2023   Chronic right shoulder pain 01/12/2022   Chest pain 01/03/2022   Eczema 12/30/2021   Ear bleeding, left 04/25/2020   Right lower quadrant abdominal pain 04/03/2020   Acute appendicitis with localized peritonitis 04/03/2020   Obesity (BMI 30-39.9) 04/03/2020   Essential hypertension 05/25/2019   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   SLAP lesion of shoulder 05/01/2016   Breast mass in male 04/20/2016   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   Patellar tendonitis of left knee 04/20/2016    Past Surgical History:  Procedure Laterality Date   APPENDECTOMY     LAPAROSCOPIC APPENDECTOMY N/A 04/04/2020   Procedure: APPENDECTOMY LAPAROSCOPIC;  Surgeon: Eletha Boas, MD;  Location:  [facility];  Service: General;  Laterality: N/A;   SHOULDER ARTHROSCOPY WITH LABRAL REPAIR Left 05/28/2016   Procedure: LEFT SHOULDER ARTHROSCOPY WITH ANTERIOR AND POSTERIOR CAPSULOLABRAL RECONSTRUCTION;  Surgeon: Franky Pointer, MD;  Location: [facility];  Service: Orthopedics;  Laterality: Left;   WISDOM TOOTH EXTRACTION         Home Medications    Prior to Admission medications  Medication Sig Start Date End Date Taking? Authorizing Provider  albuterol  (VENTOLIN  HFA) 108 (90 Base) MCG/ACT inhaler Inhale 2 puffs into the lungs every 4 (four) hours as needed. 09/16/24  Yes Stuart Vernell Norris, PA-C  predniSONE  (DELTASONE ) 20 MG tablet Take 2 tablets (40 mg total) by mouth daily with breakfast. 09/16/24  Yes Stuart Vernell Norris, PA-C  promethazine -dextromethorphan (PROMETHAZINE -DM) 6.25-15 MG/5ML syrup Take 5 mLs by mouth 4 (four) times daily as needed. 09/16/24  Yes Stuart Vernell Norris, PA-C  predniSONE  (DELTASONE ) 50 MG tablet Take one tablet for 5 days. 07/04/24   Antoniette Vermell CROME, PA-C    Family History Family History  Problem Relation Age of Onset   Heart attack Father    Cancer Other        lung   Cancer Other        liver CA   Thyroid  disease Mother    Hypertension Mother     Social History Social History[1]   Allergies   Patient has no known allergies.   Review of Systems Review of Systems PER HPI  Physical Exam Triage Vital Signs ED Triage Vitals [09/16/24  1204]  Encounter Vitals Group     BP 117/77     Girls Systolic BP Percentile      Girls Diastolic BP Percentile      Boys Systolic BP Percentile      Boys Diastolic BP Percentile      Pulse Rate 79     Resp 16     Temp 98.3 F (36.8 C)     Temp Source Oral     SpO2 97 %     Weight      Height      Head Circumference      Peak Flow      Pain Score 3     Pain Loc      Pain Education      Exclude from Growth Chart    No data found.  Updated Vital Signs BP 117/77 (BP Location: Right Arm)    Pulse 79   Temp 98.3 F (36.8 C) (Oral)   Resp 16   SpO2 97%   Visual Acuity Right Eye Distance:   Left Eye Distance:   Bilateral Distance:    Right Eye Near:   Left Eye Near:    Bilateral Near:     Physical Exam Vitals and nursing note reviewed.  Constitutional:      Appearance: He is well-developed.  HENT:     Head: Atraumatic.     Right Ear: External ear normal.     Left Ear: External ear normal.     Nose: Rhinorrhea present.     Mouth/Throat:     Pharynx: No oropharyngeal exudate or posterior oropharyngeal erythema.  Eyes:     Conjunctiva/sclera: Conjunctivae normal.     Pupils: Pupils are equal, round, and reactive to light.  Cardiovascular:     Rate and Rhythm: Normal rate and regular rhythm.     Heart sounds: Normal heart sounds.  Pulmonary:     Effort: Pulmonary effort is normal. No respiratory distress.     Breath sounds: Wheezing present. No rales.     Comments: Trace wheezes bilaterally Musculoskeletal:        General: Normal range of motion.     Cervical back: Normal range of motion and neck supple.  Lymphadenopathy:     Cervical: No cervical adenopathy.  Skin:    General: Skin is warm and dry.  Neurological:     Mental Status: He is alert and oriented to person, place, and time.  Psychiatric:        Behavior: Behavior normal.      UC Treatments / Results  Labs (all labs ordered are listed, but only abnormal results are displayed) Labs Reviewed - No data to display  EKG   Radiology No results found.  Procedures Procedures (including critical care time)  Medications Ordered in UC Medications - No data to display  Initial Impression / Assessment and Plan / UC Course  I have reviewed the triage vital signs and the nursing notes.  Pertinent labs & imaging results that were available during my care of the patient were reviewed by me and considered in my medical decision making (see chart for details).     Suspect viral bronchitis.   Treat with prednisone , albuterol , Phenergan  DM, supportive over-the-counter medications and home care.  Return for worsening or unresolving symptoms.  Final Clinical Impressions(s) / UC Diagnoses   Final diagnoses:  Viral bronchitis     Discharge Instructions      You may continue the over-the-counter cold and  congestion medications until symptoms improve.  Take the prescribed medications and follow-up for worsening symptoms    ED Prescriptions     Medication Sig Dispense Auth. Provider   predniSONE  (DELTASONE ) 20 MG tablet Take 2 tablets (40 mg total) by mouth daily with breakfast. 10 tablet Stuart Vernell Norris, PA-C   promethazine -dextromethorphan (PROMETHAZINE -DM) 6.25-15 MG/5ML syrup Take 5 mLs by mouth 4 (four) times daily as needed. 100 mL Stuart Vernell Norris, PA-C   albuterol  (VENTOLIN  HFA) 108 (90 Base) MCG/ACT inhaler Inhale 2 puffs into the lungs every 4 (four) hours as needed. 18 g Stuart Vernell Norris, [place]      PDMP not reviewed this encounter.    [1]  Social History Tobacco Use   Smoking status: Never   Smokeless tobacco: Never  Vaping Use   Vaping status: Never Used  Substance Use Topics   Alcohol use: No   Drug use: No     Stuart Vernell Norris, PA-C 09/16/24 1312
